# Patient Record
Sex: Female | Born: 1985 | Hispanic: Yes | Marital: Single | Smoking: Never smoker
Health system: Southern US, Community
[De-identification: ages and names within clinical notes are randomized; demographics above are authoritative.]

## PROBLEM LIST (undated history)

## (undated) DIAGNOSIS — O24419 Gestational diabetes mellitus in pregnancy, unspecified control: Secondary | ICD-10-CM

## (undated) DIAGNOSIS — M549 Dorsalgia, unspecified: Secondary | ICD-10-CM

## (undated) HISTORY — PX: APPENDECTOMY: SHX54

## (undated) HISTORY — DX: Dorsalgia, unspecified: M54.9

---
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
## 2012-03-15 ENCOUNTER — Emergency Department (HOSPITAL_COMMUNITY)
Admission: EM | Admit: 2012-03-15 | Discharge: 2012-03-15 | Disposition: A | Discharge status: Home / Self Care | Payer: Self-pay | Attending: Emergency Medicine | Admitting: Emergency Medicine

## 2012-03-15 ENCOUNTER — Encounter (HOSPITAL_COMMUNITY): Payer: Self-pay | Admitting: Emergency Medicine

## 2012-03-15 DIAGNOSIS — T50995A Adverse effect of other drugs, medicaments and biological substances, initial encounter: Secondary | ICD-10-CM | POA: Insufficient documentation

## 2012-03-15 DIAGNOSIS — Z79899 Other long term (current) drug therapy: Secondary | ICD-10-CM | POA: Insufficient documentation

## 2012-03-15 DIAGNOSIS — Z886 Allergy status to analgesic agent status: Secondary | ICD-10-CM

## 2012-03-15 DIAGNOSIS — Z888 Allergy status to other drugs, medicaments and biological substances status: Secondary | ICD-10-CM | POA: Insufficient documentation

## 2012-03-15 MED ORDER — HYDROCODONE-ACETAMINOPHEN 5-325 MG PO TABS: 1.0000 | ORAL_TABLET | ORAL | Status: DC | PRN

## 2012-03-15 MED ORDER — LORATADINE 10 MG PO TABS
10.0000 mg | ORAL_TABLET | Freq: Once | ORAL | Status: AC
  Administered 2012-03-15: 10 mg via ORAL
  Filled 2012-03-15 (×2): qty 1

## 2012-03-15 MED ORDER — DIPHENHYDRAMINE HCL 25 MG PO TABS: 25.0000 mg | ORAL_TABLET | Freq: Four times a day (QID) | ORAL | Status: DC

## 2012-03-15 MED ORDER — METHYLPREDNISOLONE SODIUM SUCC 125 MG IJ SOLR
125.0000 mg | Freq: Once | INTRAMUSCULAR | Status: AC
  Administered 2012-03-15: 125 mg via INTRAMUSCULAR
  Filled 2012-03-15: qty 2

## 2012-03-15 MED ORDER — PREDNISONE 20 MG PO TABS: 60.0000 mg | ORAL_TABLET | Freq: Every day | ORAL | Status: DC

## 2012-03-15 MED ORDER — LORATADINE 10 MG PO TABS: 10.0000 mg | ORAL_TABLET | Freq: Every day | ORAL | Status: DC

## 2012-03-15 NOTE — ED Provider Notes (Signed)
History     CSN: [id number]  Arrival date & time 03/15/12  0014   First MD Initiated Contact with Patient 03/15/12 0049      Chief Complaint  Patient presents with  . Allergic Reaction    (Consider location/radiation/quality/duration/timing/severity/associated sxs/prior treatment) HPI   Patient presented to the ER with concerns of allergic reaction to medication. She had been on Azithromycin for 4 days and then started taking her Fioricet this morning. She was given this medication for her headache. She said that she felt "a little weird" after taking it the first time and now she feels like her tongue and lips are swollen. No SOB, difficulty breathing or wheezing. Never had allergy to medication before. nad vss   History reviewed. No pertinent past medical history.  History reviewed. No pertinent past surgical history.  No family history on file.  History  Substance Use Topics  . Smoking status: Never Smoker   . Smokeless tobacco: Not on file  . Alcohol Use: No    OB History    Grav Para Term Preterm Abortions TAB SAB Ect Mult Living                  Review of Systems  Review of Systems  Gen: no weight loss, fevers, chills, night sweats  Eyes: no discharge or drainage, no occular pain or visual changes  Nose: no epistaxis or rhinorrhea  Mouth: no dental pain, no sore throat , subjective lip and tongue swelling Neck: no neck pain  Lungs:No wheezing, coughing or hemoptysis CV: no chest pain, palpitations, dependent edema or orthopnea  Abd: no abdominal pain, nausea, vomiting  GU: no dysuria or gross hematuria  MSK:  No abnormalities  Neuro: no headache, no focal neurologic deficits  Skin: no abnormalities Psyche: negative.   Allergies  Azithromycin and Fioricet  Home Medications   Current Outpatient Rx  Name  Route  Sig  Dispense  Refill  . AZITHROMYCIN 250 MG PO TABS   Oral   Take 250 mg by mouth daily.         Marland Kitchen BUTALBITAL-APAP-CAFFEINE  50-325-40 MG PO TABS   Oral   Take 1 tablet by mouth 2 (two) times daily as needed.         Marland Kitchen DIPHENHYDRAMINE HCL 25 MG PO TABS   Oral   Take 1 tablet (25 mg total) by mouth every 6 (six) hours.   20 tablet   0   . HYDROCODONE-ACETAMINOPHEN 5-325 MG PO TABS   Oral   Take 1 tablet by mouth every 4 (four) hours as needed for pain.   10 tablet   0   . LORATADINE 10 MG PO TABS   Oral   Take 1 tablet (10 mg total) by mouth daily.   14 tablet   0   . PREDNISONE 20 MG PO TABS   Oral   Take 3 tablets (60 mg total) by mouth daily.   15 tablet   0     BP 117/68  Pulse 85  Temp 99.3 F (37.4 C) (Oral)  Resp 14  SpO2 99%  Physical Exam  Nursing note and vitals reviewed. Constitutional: She appears well-developed and well-nourished. No distress.  HENT:  Head: Normocephalic and atraumatic. No trismus in the jaw.  Mouth/Throat: Mucous membranes are normal. No uvula swelling. No posterior oropharyngeal edema.       I am unable to appreciate swelling of the tongue or lips.   Eyes: Pupils are equal, round,  and reactive to light.  Neck: Normal range of motion. Neck supple.  Cardiovascular: Normal rate and regular rhythm.   Pulmonary/Chest: Effort normal. No respiratory distress. She has no wheezes. She has no rales. She exhibits no tenderness.  Abdominal: Soft.  Neurological: She is alert.  Skin: Skin is warm and dry.    ED Course  Procedures (including critical care time)  Labs Reviewed - No data to display No results found.   1. Allergy to pain medication       MDM  Pt appears well. I am unable to appreciate any of the symptoms she described.  She has been instructed to discontinue Fioricet.  125 mg IM solumedrol and Claritin given in ED. Rx for prednisone, benadryl, Claritin.  Strict return to ED precautions given.  Pt has been advised of the symptoms that warrant their return to the ED. Patient has voiced understanding and has agreed to follow-up with the  PCP or specialist.         Dorthula Matas, PA 03/18/12 [id number]

## 2012-03-15 NOTE — ED Notes (Signed)
Pt states understanding of discharge instruction 

## 2012-03-15 NOTE — ED Notes (Signed)
No swelling noted.

## 2012-03-15 NOTE — ED Notes (Addendum)
PT. REPORTS LEFT LOWER LIP DISCOMFORT , CONCERNED THAT HER AZITHROMAX AND FIORICET PRESCRIBED FOR HER LEFT EAR INFECTION IS CAUSING HER LOWER LIP TO TWITCH. [facility]

## 2012-03-18 NOTE — ED Provider Notes (Signed)
  Medical screening examination/treatment/procedure(s) were performed by non-physician practitioner and as supervising physician I was immediately available for consultation/collaboration.r  Olivia Mackie, MD 03/18/12 2001

## 2012-03-23 ENCOUNTER — Emergency Department (HOSPITAL_COMMUNITY): Payer: Self-pay

## 2012-03-23 ENCOUNTER — Encounter (HOSPITAL_COMMUNITY): Payer: Self-pay | Admitting: Emergency Medicine

## 2012-03-23 ENCOUNTER — Emergency Department (HOSPITAL_COMMUNITY)
Admission: EM | Admit: 2012-03-23 | Discharge: 2012-03-23 | Disposition: A | Discharge status: Home Health | Payer: Self-pay | Attending: Emergency Medicine | Admitting: Emergency Medicine

## 2012-03-23 DIAGNOSIS — M542 Cervicalgia: Secondary | ICD-10-CM | POA: Insufficient documentation

## 2012-03-23 DIAGNOSIS — R202 Paresthesia of skin: Secondary | ICD-10-CM

## 2012-03-23 DIAGNOSIS — M62838 Other muscle spasm: Secondary | ICD-10-CM | POA: Insufficient documentation

## 2012-03-23 DIAGNOSIS — R209 Unspecified disturbances of skin sensation: Secondary | ICD-10-CM | POA: Insufficient documentation

## 2012-03-23 DIAGNOSIS — M25519 Pain in unspecified shoulder: Secondary | ICD-10-CM | POA: Insufficient documentation

## 2012-03-23 DIAGNOSIS — Z3202 Encounter for pregnancy test, result negative: Secondary | ICD-10-CM | POA: Insufficient documentation

## 2012-03-23 LAB — URINALYSIS, ROUTINE W REFLEX MICROSCOPIC
Glucose, UA: NEGATIVE mg/dL
Ketones, ur: NEGATIVE mg/dL
Protein, ur: NEGATIVE mg/dL

## 2012-03-23 LAB — URINE MICROSCOPIC-ADD ON

## 2012-03-23 MED ORDER — DIAZEPAM 5 MG PO TABS
5.0000 mg | ORAL_TABLET | Freq: Once | ORAL | Status: AC
  Administered 2012-03-23: 5 mg via ORAL
  Filled 2012-03-23: qty 1

## 2012-03-23 MED ORDER — CYCLOBENZAPRINE HCL 10 MG PO TABS: 10.0000 mg | ORAL_TABLET | Freq: Two times a day (BID) | ORAL | Status: DC | PRN

## 2012-03-23 MED ORDER — IBUPROFEN 800 MG PO TABS: 800.0000 mg | ORAL_TABLET | Freq: Three times a day (TID) | ORAL | Status: DC

## 2012-03-23 MED ORDER — HYDROCODONE-ACETAMINOPHEN 5-325 MG PO TABS: 2.0000 | ORAL_TABLET | ORAL | Status: DC | PRN

## 2012-03-23 MED ORDER — HYDROCODONE-ACETAMINOPHEN 5-325 MG PO TABS
2.0000 | ORAL_TABLET | Freq: Once | ORAL | Status: AC
  Administered 2012-03-23: 2 via ORAL
  Filled 2012-03-23: qty 2

## 2012-03-23 MED ORDER — IBUPROFEN 800 MG PO TABS
800.0000 mg | ORAL_TABLET | Freq: Once | ORAL | Status: AC
  Administered 2012-03-23: 800 mg via ORAL
  Filled 2012-03-23: qty 1

## 2012-03-23 NOTE — ED Notes (Signed)
Translator stated, I have pain on my face and and feels like its a burn.  Here on 03/15/12 and treated for the same.

## 2012-03-23 NOTE — ED Notes (Signed)
Pt discharged to home with family. NAD.  

## 2012-03-23 NOTE — ED Provider Notes (Signed)
History     CSN: [id number]  Arrival date & time 03/23/12  1244   First MD Initiated Contact with Patient 03/23/12 1314      Chief Complaint  Patient presents with  . Facial Pain    (Consider location/radiation/quality/duration/timing/severity/associated sxs/prior treatment) HPI Comments: Patient presents with right-sided facial, neck, shoulder pain for the past one day. Denies any injury. Pain is deep and burning. Denies similar episodes. Seen last week for questionable allergic reaction with facial complaints but she says this is different. Denies any fever, chills, nausea or vomiting. Denies any weakness, numbness or tingling. Denies any rashes. Denies any difficulty breathing or swallowing.  The history is provided by the patient and a relative. The history is limited by a language barrier. A language interpreter was used.    History reviewed. No pertinent past medical history.  History reviewed. No pertinent past surgical history.  No family history on file.  History  Substance Use Topics  . Smoking status: Never Smoker   . Smokeless tobacco: Not on file  . Alcohol Use: No    OB History    Grav Para Term Preterm Abortions TAB SAB Ect Mult Living                  Review of Systems  Constitutional: Negative for activity change and appetite change.  HENT: Negative for congestion and rhinorrhea.   Respiratory: Negative for cough, chest tightness and shortness of breath.   Gastrointestinal: Negative for nausea, vomiting and abdominal pain.  Genitourinary: Negative for dysuria and hematuria.  Musculoskeletal: Positive for myalgias and arthralgias.  Skin: Negative for rash.  Neurological: Negative for dizziness, weakness and headaches.  A complete 10 system review of systems was obtained and all systems are negative except as noted in the HPI and PMH.    Allergies  Azithromycin and Fioricet  Home Medications   Current Outpatient Rx  Name  Route  Sig  Dispense   Refill  . BUTALBITAL-APAP-CAFFEINE 50-325-40 MG PO TABS   Oral   Take 1 tablet by mouth 2 (two) times daily as needed. For headache         . DIPHENHYDRAMINE HCL 25 MG PO TABS   Oral   Take 1 tablet (25 mg total) by mouth every 6 (six) hours.   20 tablet   0   . LORATADINE 10 MG PO TABS   Oral   Take 1 tablet (10 mg total) by mouth daily.   14 tablet   0   . CYCLOBENZAPRINE HCL 10 MG PO TABS   Oral   Take 1 tablet (10 mg total) by mouth 2 (two) times daily as needed for muscle spasms.   20 tablet   0   . HYDROCODONE-ACETAMINOPHEN 5-325 MG PO TABS   Oral   Take 2 tablets by mouth every 4 (four) hours as needed for pain.   6 tablet   0   . IBUPROFEN 800 MG PO TABS   Oral   Take 1 tablet (800 mg total) by mouth 3 (three) times daily.   21 tablet   0     BP 138/76  Pulse 105  Temp 98.2 F (36.8 C) (Oral)  Resp 16  SpO2 97%  Physical Exam  Constitutional: She is oriented to person, place, and time. She appears well-developed and well-nourished. No distress.  HENT:  Head: Normocephalic and atraumatic.  Mouth/Throat: Oropharynx is clear and moist. No oropharyngeal exudate.       Smile symmetric,  no rash. No jaw swelling.  Eyes: Conjunctivae normal and EOM are normal. Pupils are equal, round, and reactive to light.  Neck: Normal range of motion. Neck supple.       No meningismus  Cardiovascular: Normal rate, regular rhythm and normal heart sounds.   No murmur heard. Pulmonary/Chest: Effort normal and breath sounds normal. No respiratory distress.  Abdominal: Soft. There is no tenderness. There is no rebound and no guarding.  Musculoskeletal: Normal range of motion. She exhibits tenderness. She exhibits no edema.       Tenderness and spasm to right upper trapezius Equal grip strength bilaterally  Neurological: She is alert and oriented to person, place, and time. No cranial nerve deficit. She exhibits normal muscle tone. Coordination normal.  Skin: Skin is warm.     ED Course  Procedures (including critical care time)   Labs Reviewed  PREGNANCY, URINE  URINALYSIS, ROUTINE W REFLEX MICROSCOPIC   Ct Head Wo Contrast  03/23/2012  *RADIOLOGY REPORT*  Clinical Data: Headache and facial pain, recently treated for left ear infection.  CT HEAD WITHOUT CONTRAST  Technique:  Contiguous axial images were obtained from the base of the skull through the vertex without contrast.  Comparison: None.  Findings:  Wallace Cullens white differentiation is maintained.  No CT evidence of acute large territory infarct.  No intraparenchymal or extra-axial mass or hemorrhage.  Normal size and configuration of the ventricles and basilar cisterns.  No midline shift.  Limited visualization of the paranasal sinuses and mastoid air cells are normal.  No air fluid levels.  Regional soft tissues are normal.  No displaced calvarial fracture.  IMPRESSION: Negative noncontrast head CT.   Original Report Authenticated By: Tacey Ruiz, MD    Mr Brain Wo Contrast  03/23/2012  *RADIOLOGY REPORT*  Clinical Data: Headache and facial pain.  MRI HEAD WITHOUT CONTRAST  Technique:  Multiplanar, multiecho pulse sequences of the brain and surrounding structures were obtained according to standard protocol without intravenous contrast.  Comparison: None.  Findings: A single white matter lesion in the right corona radiata measures 3.5 mm.  No other significant white matter disease is present.  No acute infarct, hemorrhage, or mass lesion is evident. Flow is present in the major intracranial arteries.  The globes orbits are intact.  The paranasal sinuses and mastoid air cells are clear.  IMPRESSION:  1.  Single 3 mm white matter lesion in the right corona radiata is nonspecific in a 27 year old female and may be within normal limits. 2.  Otherwise normal MRI of the brain.   Original Report Authenticated By: Marin Roberts, M.D.      1. Muscle spasm   2. Paresthesia       MDM  Right upper back and neck  pain it seems to be radiating into face. Exam consistent with muscle spasm. No rashes or neurological deficits.  Patient has equal grip strength bilaterally. Her smile symmetric. There is no facial asymmetry. She has significant tenderness palpation of her right upper trapezius and shoulder.  Given her paresthesias and "mouth twisting out of shape" last week, neuroimaging obtained.  No evidence of CVA. Nonspecific single white matter lesion discussed with Dr. Leroy Kennedy of neurology who agrees may be normal variant.  Treat muscle spasm which is likely source of patient's facial pain and paresthesias. Followup with neurology.    Glynn Octave, MD 03/23/12 [id number]

## 2014-08-04 ENCOUNTER — Encounter: Payer: Self-pay | Admitting: Obstetrics & Gynecology

## 2014-08-05 ENCOUNTER — Encounter: Payer: Self-pay | Admitting: *Deleted

## 2014-09-03 ENCOUNTER — Ambulatory Visit (INDEPENDENT_AMBULATORY_CARE_PROVIDER_SITE_OTHER): Payer: Self-pay | Admitting: Obstetrics & Gynecology

## 2014-09-03 ENCOUNTER — Encounter: Payer: Self-pay | Admitting: Obstetrics & Gynecology

## 2014-09-03 VITALS — BP 120/63 | HR 77 | Temp 98.1°F | Ht 60.0 in | Wt 174.9 lb

## 2014-09-03 DIAGNOSIS — N912 Amenorrhea, unspecified: Secondary | ICD-10-CM

## 2014-09-03 MED ORDER — MEDROXYPROGESTERONE ACETATE 10 MG PO TABS: 20.0000 mg | ORAL_TABLET | Freq: Every day | ORAL | Status: DC

## 2014-09-03 NOTE — Progress Notes (Signed)
[employer] used for [employer]

## 2014-09-03 NOTE — Progress Notes (Signed)
Patient ID: Julie Brooks, female   DOB: 09/09/1985, 29 y.o.   MRN: [id number]  Chief Complaint  Patient presents with  . Amenorrhea    HPI Julie Brooks is a 29 y.o. female.  [id number] No LMP recorded. Amenorrhea long standing after using depo provera then Nexplanon which was removed 1/16 at health dept. No withdrawal after provera challenge. Wants to conceive  HPI  History reviewed. No pertinent past medical history.  Past Surgical History  Procedure Laterality Date  . Appendectomy      Family History  Problem Relation Age of Onset  . Diabetes Father   . Diabetes Mother     Social History History  Substance Use Topics  . Smoking status: Never Smoker   . Smokeless tobacco: Never Used  . Alcohol Use: No    Allergies  Allergen Reactions  . Azithromycin Swelling    Possible reaction to current medication  . Fioricet [Butalbital-Apap-Caffeine] Swelling    Possible reaction to current medication    Current Outpatient Prescriptions  Medication Sig Dispense Refill  . Prenatal Vit-Fe Fumarate-FA (PRENATAL MULTIVITAMIN) TABS tablet Take 1 tablet by mouth daily at 12 noon.    . medroxyPROGESTERone (PROVERA) 10 MG tablet Take 2 tablets (20 mg total) by mouth daily. Use for ten days 20 tablet 2   No current facility-administered medications for this visit.    Review of Systems Review of Systems  Constitutional: Negative.   Gastrointestinal: Negative.   Endocrine: Negative.   Genitourinary: Negative.     Blood pressure 120/63, pulse 77, temperature 98.1 F (36.7 C), temperature source Oral, height 5' (1.524 m), weight 174 lb 14.4 oz (79.334 kg).  Physical Exam Physical Exam  Constitutional: She is oriented to person, place, and time. She appears well-developed. No distress.  Pulmonary/Chest: Effort normal. No respiratory distress.  Neurological: She is alert and oriented to person, place, and time.  Skin: Skin is warm and dry.  Psychiatric: She has a  normal mood and affect. Her behavior is normal.    Data Reviewed Referral notes  Assessment    Amenorrhea probable anovulation after progestin BCM stopped 6 mo ago R/O ovarian failure     Plan    Ms. Mas Brooks had no medications administered during this visit.  Provera 20 mg 10 day challenge [facility] RTC 2 mo       Sherra Kimmons 09/03/2014, 1:20 PM

## 2014-09-04 LAB — FOLLICLE STIMULATING HORMONE: FSH: 7.6 m[IU]/mL

## 2014-12-03 ENCOUNTER — Ambulatory Visit (INDEPENDENT_AMBULATORY_CARE_PROVIDER_SITE_OTHER): Payer: Self-pay | Admitting: Obstetrics & Gynecology

## 2014-12-03 ENCOUNTER — Encounter: Payer: Self-pay | Admitting: Obstetrics & Gynecology

## 2014-12-03 VITALS — BP 119/64 | HR 83 | Temp 98.8°F | Ht 60.0 in | Wt 173.8 lb

## 2014-12-03 DIAGNOSIS — N912 Amenorrhea, unspecified: Secondary | ICD-10-CM

## 2014-12-03 NOTE — Patient Instructions (Signed)
Amenorrea secundaria  (Secondary Amenorrhea) La amenorrea secundaria es la detencin del flujo menstrual durante 3-6 meses en una mujer que previamente tena sus perodos. Hay muchas causas posibles: La mayora de las causas no son graves. Generalmente, al tratar el problema subyacente que causa la detencin de la menstruacin, podr volver a tener sus perodos normales. CAUSAS  Algunas causas comunes de la falta de menstruacin son:  Desnutricin.  Bajo nivel de azcar en la sangre (hipoglucemia).  Enfermedad poliqustica de los ovarios.  Estrs o miedos.  Lactancia materna.  Desequilibrio hormonal.  Insuficiencia ovrica.  Medicamentos.  Obesidad extrema.  Fibrosis qustica.  Reduccin de peso drstica por cualquier causa.  Menopausia precoz.  Extirpacin de los ovarios o del tero.  Anticonceptivos.  Enfermedades.  Enfermedades de larga duracin (crnicas).  Sndrome de [place].  Problemas de tiroides.  Pldoras, parches o anillos vaginales para el control de la natalidad. FACTORES DE RIESGO Puede tener ms riesgo de amenorrea secundaria si:  Tiene una historia familiar de este problema.  Sufre un trastorno alimentario.  Realiza entrenamiento deportivo. DIAGNSTICO  Este diagnstico la realiza el mdico por medio de la historia clnica y el examen fsico. Incluir un examen plvico para verificar si hay problemas en los rganos reproductores. Debe descartarse la posibilidad de embarazo. Generalmente se indicarn diferentes anlisis de sangre para medir diferentes tipos de hormonas en el organismo. Le indicarn anlisis de orina. Le harn algunos estudios especializados (ecografas, tomografa computada, resonancia magntica o histeroscopa) y tambin medirn su ndice de masa corporal (IMC). TRATAMIENTO  El tratamiento depende de la causa de la amenorrea. Si hay un trastorno de la alimentacin, deber tratarse con la dieta y la terapia adecuadas. Los  trastornos crnicos pueden mejorar con el tratamiento de la enfermedad. La amenorrea puede corregirse con medicamentos, cambios en el estilo de vida o con ciruga. Si la amenorrea no puede corregirse, algunas veces es posible crear una falsa menstruacin con medicamentos. INSTRUCCIONES PARA EL CUIDADO EN EL HOGAR  Consuma una dieta saludable.  Controle los problemas de peso.  Haga ejercicios con regularidad, pero no excesivamente.  Duerma lo suficiente.  Controle el estrs.  Observe si hay cambios en el ciclo menstrual. Mantenga un registro del momento en que ocurren los perodos. Anote la fecha de inicio de los perodos, cunto duran y si hay problemas. SOLICITE ATENCIN MDICA SI: Los sntomas no mejoran con el tratamiento.   Esta informacin no tiene como fin reemplazar el consejo del mdico. Asegrese de hacerle al mdico cualquier pregunta que tenga.   Document Released: 10/09/2012 Document Revised: 02/27/2014 Elsevier Interactive Patient Education 2016 [employer]  

## 2014-12-03 NOTE — Progress Notes (Signed)
Subjective:     Patient ID: Darnelle CatalanGlendy Mas Esquivel, female   DOB: 06/24/1985, 29 y.o.   MRN: [id number]  HPI [id number] Patient's last menstrual period was 11/27/2014. Period was for 2 days and light. No W/D on Provera. Still would like to conceive   Review of Systems  Constitutional: Negative.   Cardiovascular: Negative.   Genitourinary: Positive for menstrual problem (irregular). Negative for pelvic pain.       Objective:   Physical Exam  Constitutional: She is oriented to person, place, and time. She appears well-developed. No distress.  Cardiovascular: Normal rate.   Pulmonary/Chest: Effort normal. No respiratory distress.  Neurological: She is alert and oriented to person, place, and time.  Skin: Skin is warm and dry.  Psychiatric: She has a normal mood and affect. Her behavior is normal.       Assessment:     Likely prolonged amenorrhea after progestin use. Wants to conceive    Normal FSH  Plan:     Provera 10 mg 10 days every 6 weeks if no menses, report if she conceives     Adam PhenixJames G Dynasty Holquin, MD 12/03/2014

## 2014-12-03 NOTE — Progress Notes (Signed)
Spanish interpreter Clent RidgesAllis Brooks; pt reports taking provera challenge in July and just had a period on 11/27/14.  Pt wants to become pregnant.  Gave pt # to free pap screening.

## 2016-02-01 LAB — CYTOLOGY - PAP: PAP SMEAR: NEGATIVE

## 2017-01-15 LAB — OB RESULTS CONSOLE HEPATITIS B SURFACE ANTIGEN: Hepatitis B Surface Ag: NEGATIVE

## 2017-01-15 LAB — OB RESULTS CONSOLE RUBELLA ANTIBODY, IGM: Rubella: IMMUNE

## 2017-01-15 LAB — SICKLE CELL SCREEN: SICKLE CELL SCREEN: NORMAL

## 2017-01-15 LAB — OB RESULTS CONSOLE HIV ANTIBODY (ROUTINE TESTING): HIV: NONREACTIVE

## 2017-01-18 LAB — OB RESULTS CONSOLE VARICELLA ZOSTER ANTIBODY, IGG: VARICELLA IGG: IMMUNE

## 2017-01-18 LAB — OB RESULTS CONSOLE ABO/RH: RH TYPE: POSITIVE

## 2017-01-18 LAB — OB RESULTS CONSOLE GC/CHLAMYDIA
CHLAMYDIA, DNA PROBE: NEGATIVE
GC PROBE AMP, GENITAL: NEGATIVE

## 2017-01-18 LAB — OB RESULTS CONSOLE HGB/HCT, BLOOD
HCT: 37
Hemoglobin: 12.2

## 2017-01-18 LAB — OB RESULTS CONSOLE HIV ANTIBODY (ROUTINE TESTING): HIV: NONREACTIVE

## 2017-01-18 LAB — OB RESULTS CONSOLE RPR: RPR: NONREACTIVE

## 2017-01-18 LAB — OB RESULTS CONSOLE ANTIBODY SCREEN: Antibody Screen: NEGATIVE

## 2017-01-18 LAB — OB RESULTS CONSOLE PLATELET COUNT: Platelets: 246

## 2017-01-18 LAB — GLUCOSE TOLERANCE, 1 HOUR: Glucose 1 Hour: 150

## 2017-01-18 LAB — CULTURE, OB URINE: Urine Culture, OB: NEGATIVE

## 2017-01-18 LAB — CYSTIC FIBROSIS DIAGNOSTIC STUDY: INTERPRETATION-CFDNA: NEGATIVE

## 2017-01-22 ENCOUNTER — Other Ambulatory Visit (HOSPITAL_COMMUNITY): Payer: Self-pay | Admitting: Nurse Practitioner

## 2017-01-22 DIAGNOSIS — Z3A20 20 weeks gestation of pregnancy: Secondary | ICD-10-CM

## 2017-01-22 DIAGNOSIS — O09219 Supervision of pregnancy with history of pre-term labor, unspecified trimester: Secondary | ICD-10-CM

## 2017-01-22 LAB — GLUCOSE, 3 HOUR

## 2017-01-25 ENCOUNTER — Ambulatory Visit: Payer: Self-pay

## 2017-01-30 ENCOUNTER — Encounter: Payer: Self-pay | Admitting: *Deleted

## 2017-01-30 ENCOUNTER — Ambulatory Visit: Payer: Self-pay

## 2017-02-01 ENCOUNTER — Ambulatory Visit: Payer: Self-pay | Admitting: *Deleted

## 2017-02-01 ENCOUNTER — Encounter: Payer: Self-pay | Attending: Family Medicine | Admitting: *Deleted

## 2017-02-01 DIAGNOSIS — R7309 Other abnormal glucose: Secondary | ICD-10-CM

## 2017-02-01 DIAGNOSIS — R7302 Impaired glucose tolerance (oral): Secondary | ICD-10-CM | POA: Insufficient documentation

## 2017-02-01 NOTE — Progress Notes (Signed)
  Patient was seen on 02/01/2017 for Gestational Diabetes self-management . Patient here with Spanish Interpretor. No history of GDM with last pregnancy 10 years ago. Patient met with Health Department educator and received some training on food during GDM, but is apparently over restricting her food intake to 15 grams carb per meal. Diet history reveals good variety of all food groups and no sweetened beverages now. The following learning objectives were met by the patient :   States the definition of Gestational Diabetes  States why dietary management is important in controlling blood glucose  Describes the effects of carbohydrates on blood glucose levels  Demonstrates ability to create a balanced meal plan  Demonstrates carbohydrate counting   States when to check blood glucose levels  Demonstrates proper blood glucose monitoring techniques  States the effect of stress and exercise on blood glucose levels  States the importance of limiting caffeine and abstaining from alcohol and smoking  Plan:  Aim for 3 Carb Choices per meal (45 grams) +/- 1 either way  Aim for 1-2 Carbs per snack Begin reading food labels for Total Carbohydrate of foods Consider  increasing your activity level by walking or other activity daily as tolerated Begin checking BG before breakfast and 2 hours after first bite of breakfast, lunch and dinner as directed by MD  Bring Log Book to every medical appointment   Take medication if directed by MD  Blood glucose monitor given: [employer] Lot # [id number] Exp: 02/10/2019 Blood glucose reading: 77 mg/dl fasting today  Patient instructed to monitor glucose levels: FBS: 60 - 95 mg/dl 2 hour: <120 mg/dl  Patient received the following handouts:  Nutrition Diabetes and Pregnancy  Carbohydrate Counting List  Patient will be seen for follow-up as needed.

## 2017-02-02 ENCOUNTER — Encounter: Payer: Self-pay | Admitting: Family Medicine

## 2017-02-02 ENCOUNTER — Other Ambulatory Visit: Payer: Self-pay

## 2017-02-02 ENCOUNTER — Ambulatory Visit (INDEPENDENT_AMBULATORY_CARE_PROVIDER_SITE_OTHER): Payer: Medicaid Other | Admitting: Family Medicine

## 2017-02-02 DIAGNOSIS — O0990 Supervision of high risk pregnancy, unspecified, unspecified trimester: Secondary | ICD-10-CM | POA: Insufficient documentation

## 2017-02-02 DIAGNOSIS — Z8632 Personal history of gestational diabetes: Secondary | ICD-10-CM | POA: Insufficient documentation

## 2017-02-02 DIAGNOSIS — O24410 Gestational diabetes mellitus in pregnancy, diet controlled: Secondary | ICD-10-CM | POA: Diagnosis not present

## 2017-02-02 DIAGNOSIS — O09211 Supervision of pregnancy with history of pre-term labor, first trimester: Secondary | ICD-10-CM | POA: Diagnosis not present

## 2017-02-02 DIAGNOSIS — O09219 Supervision of pregnancy with history of pre-term labor, unspecified trimester: Secondary | ICD-10-CM

## 2017-02-02 DIAGNOSIS — O0991 Supervision of high risk pregnancy, unspecified, first trimester: Secondary | ICD-10-CM

## 2017-02-02 DIAGNOSIS — O09899 Supervision of other high risk pregnancies, unspecified trimester: Secondary | ICD-10-CM

## 2017-02-02 DIAGNOSIS — Z8751 Personal history of pre-term labor: Secondary | ICD-10-CM | POA: Insufficient documentation

## 2017-02-02 MED ORDER — ASPIRIN EC 81 MG PO TBEC: 81.0000 mg | DELAYED_RELEASE_TABLET | Freq: Every day | ORAL | 3 refills | Status: DC

## 2017-02-02 MED ORDER — HYDROXYPROGESTERONE CAPROATE 250 MG/ML IM OIL: 250.0000 mg | TOPICAL_OIL | INTRAMUSCULAR | 5 refills | Status: DC

## 2017-02-02 NOTE — Progress Notes (Signed)
Spanish Interpreter Natale Layrika McReynolds New ob packet given

## 2017-02-02 NOTE — Patient Instructions (Signed)
Segundo trimestre de embarazo (Second Trimester of Pregnancy) El segundo trimestre va desde la semana13 hasta la 28, desde el cuarto hasta el sexto mes, y suele ser el momento en el que mejor se siente. Su organismo se ha adaptado a estar embarazada y comienza a sentirse fsicamente mejor. En general, las nuseas matutinas han disminuido o han desaparecido completamente, puede tener ms energa y un aumento de apetito. El segundo trimestre es tambin la poca en la que el feto se desarrolla rpidamente. Hacia el final del sexto mes, el feto mide aproximadamente 9pulgadas (23cm) y pesa alrededor de 1 libras (700g). Es probable que sienta que el beb se mueve (da pataditas) entre las 18 y 20semanas del embarazo. CAMBIOS EN EL ORGANISMO Su organismo atraviesa por muchos cambios durante el embarazo, y estos varan de una mujer a otra.  Seguir aumentando de peso. Notar que la parte baja del abdomen sobresale.  Podrn aparecer las primeras estras en las caderas, el abdomen y las mamas.  Es posible que tenga dolores de cabeza que pueden aliviarse con los medicamentos que el mdico le permita tomar.  Tal vez tenga necesidad de orinar con ms frecuencia porque el feto est ejerciendo presin sobre la vejiga.  Debido al embarazo podr sentir acidez estomacal con frecuencia.  Puede estar estreida, ya que ciertas hormonas enlentecen los movimientos de los msculos que empujan los desechos a travs de los intestinos.  Pueden aparecer hemorroides o abultarse e hincharse las venas (venas varicosas).  Puede tener dolor de espalda que se debe al aumento de peso y a que las hormonas del embarazo relajan las articulaciones entre los huesos de la pelvis, y como consecuencia de la modificacin del peso y los msculos que mantienen el equilibrio.  Las mamas seguirn creciendo y le dolern.  Las encas pueden sangrar y estar sensibles al cepillado y al hilo dental.  Pueden aparecer zonas oscuras o  manchas (cloasma, mscara del embarazo) en el rostro que probablemente se atenuar despus del nacimiento del beb.  Es posible que se forme una lnea oscura desde el ombligo hasta la zona del pubis (linea nigra) que probablemente se atenuar despus del nacimiento del beb.  Tal vez haya cambios en el cabello que pueden incluir su engrosamiento, crecimiento rpido y cambios en la textura. Adems, a algunas mujeres se les cae el cabello durante o despus del embarazo, o tienen el cabello seco o fino. Lo ms probable es que el cabello se le normalice despus del nacimiento del beb. QU DEBE ESPERAR EN LAS CONSULTAS PRENATALES Durante una visita prenatal de rutina:  La pesarn para asegurarse de que usted y el feto estn creciendo normalmente.  Le tomarn la presin arterial.  Le medirn el abdomen para controlar el desarrollo del beb.  Se escucharn los latidos cardacos fetales.  Se evaluarn los resultados de los estudios solicitados en visitas anteriores. El mdico puede preguntarle lo siguiente:  Cmo se siente.  Si siente los movimientos del beb.  Si ha tenido sntomas anormales, como prdida de lquido, sangrado, dolores de cabeza intensos o clicos abdominales.  Si est consumiendo algn producto que contenga tabaco, como cigarrillos, tabaco de mascar y cigarrillos electrnicos.  Si tiene alguna pregunta. Otros estudios que podrn realizarse durante el segundo trimestre incluyen lo siguiente:  Anlisis de sangre para detectar lo siguiente:  Concentraciones de hierro bajas (anemia).  Diabetes gestacional (entre la semana 24 y la 28).  Anticuerpos Rh.  Anlisis de orina para detectar infecciones, diabetes o protenas en la orina.    Una ecografa para confirmar que el beb crece y se desarrolla correctamente.  Una amniocentesis para diagnosticar posibles problemas genticos.  Estudios del feto para descartar espina bfida y sndrome de Down.  Prueba del VIH (virus  de inmunodeficiencia humana). Los exmenes prenatales de rutina incluyen la prueba de deteccin del VIH, a menos que decida no realizrsela. INSTRUCCIONES PARA EL CUIDADO EN EL HOGAR  Evite fumar, consumir hierbas, beber alcohol y tomar frmacos que no le hayan recetado. Estas sustancias qumicas afectan la formacin y el desarrollo del beb.  No consuma ningn producto que contenga tabaco, lo que incluye cigarrillos, tabaco de mascar y cigarrillos electrnicos. Si necesita ayuda para dejar de fumar, consulte al mdico. Puede recibir asesoramiento y otro tipo de recursos para dejar de fumar.  Siga las indicaciones del mdico en relacin con el uso de medicamentos. Durante el embarazo, hay medicamentos que son seguros de tomar y otros que no.  Haga ejercicio solamente como se lo haya indicado el mdico. Sentir clicos uterinos es un buen signo para detener la actividad fsica.  Contine comiendo alimentos sanos con regularidad.  Use un sostn que le brinde buen soporte si le duelen las mamas.  No se d baos de inmersin en agua caliente, baos turcos ni saunas.  Use el cinturn de seguridad en todo momento mientras conduce.  No coma carne cruda ni queso sin cocinar; evite el contacto con las bandejas sanitarias de los gatos y la tierra que estos animales usan. Estos elementos contienen grmenes que pueden causar defectos congnitos en el beb.  Tome las vitaminas prenatales.  Tome entre 1500 y 2000mg de calcio diariamente comenzando en la semana20 del embarazo hasta el parto.  Si est estreida, pruebe un laxante suave (si el mdico lo autoriza). Consuma ms alimentos ricos en fibra, como vegetales y frutas frescos y cereales integrales. Beba gran cantidad de lquido para mantener la orina de tono claro o color amarillo plido.  Dese baos de asiento con agua tibia para aliviar el dolor o las molestias causadas por las hemorroides. Use una crema para las hemorroides si el mdico la  autoriza.  Si tiene venas varicosas, use medias de descanso. Eleve los pies durante 15minutos, 3 o 4veces por da. Limite el consumo de sal en su dieta.  No levante objetos pesados, use zapatos de tacones bajos y mantenga una buena postura.  Descanse con las piernas elevadas si tiene calambres o dolor de cintura.  Visite a su dentista si an no lo ha hecho durante el embarazo. Use un cepillo de dientes blando para higienizarse los dientes y psese el hilo dental con suavidad.  Puede seguir manteniendo relaciones sexuales, a menos que el mdico le indique lo contrario.  Concurra a todas las visitas prenatales segn las indicaciones de su mdico. SOLICITE ATENCIN MDICA SI:  Tiene mareos.  Siente clicos leves, presin en la pelvis o dolor persistente en el abdomen.  Tiene nuseas, vmitos o diarrea persistentes.  Observa una secrecin vaginal con mal olor.  Siente dolor al orinar. SOLICITE ATENCIN MDICA DE INMEDIATO SI:  Tiene fiebre.  Tiene una prdida de lquido por la vagina.  Tiene sangrado o pequeas prdidas vaginales.  Siente dolor intenso o clicos en el abdomen.  Sube o baja de peso rpidamente.  Tiene dificultad para respirar y siente dolor de pecho.  Sbitamente se le hinchan mucho el rostro, las manos, los tobillos, los pies o las piernas.  No ha sentido los movimientos del beb durante una hora.  Siente un   dolor de cabeza intenso que no se alivia con medicamentos.  Su visin se modifica. Esta informacin no tiene como fin reemplazar el consejo del mdico. Asegrese de hacerle al mdico cualquier pregunta que tenga. Document Released: 11/16/2004 Document Revised: 02/27/2014 Document Reviewed: 04/09/2012 Elsevier Interactive Patient Education  2017 [employer]   Lactancia materna (Breastfeeding) Decidir amamantar es una de las mejores elecciones que puede hacer por usted y su beb. El cambio hormonal durante el embarazo produce el desarrollo del  tejido mamario y aumenta la cantidad y el tamao de los conductos galactforos. Estas hormonas tambin permiten que las protenas, los azcares y las grasas de la sangre produzcan la leche materna en las glndulas productoras de leche. Las hormonas impiden que la leche materna sea liberada antes del nacimiento del beb, adems de impulsar el flujo de leche luego del nacimiento. Una vez que ha comenzado a amamantar, pensar en el beb, as como la succin o el llanto, pueden estimular la liberacin de leche de las glndulas productoras de leche. LOS BENEFICIOS DE AMAMANTAR Para el beb   La primera leche (calostro) ayuda a mejorar el funcionamiento del sistema digestivo del beb.  La leche tiene anticuerpos que ayudan a prevenir las infecciones en el beb.  El beb tiene una menor incidencia de asma, alergias y del sndrome de muerte sbita del lactante.  Los nutrientes en la leche materna son mejores para el beb que la leche maternizada y estn preparados exclusivamente para cubrir las necesidades del beb.  La leche materna mejora el desarrollo cerebral del beb.  Es menos probable que el beb desarrolle otras enfermedades, como obesidad infantil, asma o diabetes mellitus de tipo 2. Para usted   La lactancia materna favorece el desarrollo de un vnculo muy especial entre la madre y el beb.  Es conveniente. La leche materna siempre est disponible a la temperatura correcta y es econmica.  La lactancia materna ayuda a quemar caloras y a perder el peso ganado durante el embarazo.  Favorece la contraccin del tero al tamao que tena antes del embarazo de manera ms rpida y disminuye el sangrado (loquios) despus del parto.  La lactancia materna contribuye a reducir el riesgo de desarrollar diabetes mellitus de tipo 2, osteoporosis o cncer de mama o de ovario en el futuro. SIGNOS DE QUE EL BEB EST HAMBRIENTO Primeros signos de hambre   Aumenta su estado de alerta o actividad.  Se  estira.  Mueve la cabeza de un lado a otro.  Mueve la cabeza y abre la boca cuando se le toca la mejilla o la comisura de la boca (reflejo de bsqueda).  Aumenta las vocalizaciones, tales como sonidos de succin, se relame los labios, emite arrullos, suspiros, o chirridos.  Mueve la mano hacia la boca.  Se chupa con ganas los dedos o las manos. Signos tardos de hambre   Est agitado.  Llora de manera intermitente. Signos de hambre extrema  Los signos de hambre extrema requerirn que lo calme y lo consuele antes de que el beb pueda alimentarse adecuadamente. No espere a que se manifiesten los siguientes signos de hambre extrema para comenzar a amamantar:  Agitacin.  Llanto intenso y fuerte.  Gritos. INFORMACIN BSICA SOBRE LA LACTANCIA MATERNA Iniciacin de la lactancia materna   Encuentre un lugar cmodo para sentarse o acostarse, con un buen respaldo para el cuello y la espalda.  Coloque una almohada o una manta enrollada debajo del beb para acomodarlo a la altura de la mama (si est sentada).   Las almohadas para amamantar se han diseado especialmente a fin de servir de apoyo para los brazos y el beb mientras amamanta.  Asegrese de que el abdomen del beb est frente al suyo.  Masajee suavemente la mama. Con las yemas de los dedos, masajee la pared del pecho hacia el pezn en un movimiento circular. Esto estimula el flujo de leche. Es posible que deba continuar este movimiento mientras amamanta si la leche fluye lentamente.  Sostenga la mama con el pulgar por arriba del pezn y los otros 4 dedos por debajo de la mama. Asegrese de que los dedos se encuentren lejos del pezn y de la boca del beb.  Empuje suavemente los labios del beb con el pezn o con el dedo.  Cuando la boca del beb se abra lo suficiente, acrquelo rpidamente a la mama e introduzca todo el pezn y la zona oscura que lo rodea (areola), tanto como sea posible, dentro de la boca del beb.  Debe  haber ms areola visible por arriba del labio superior del beb que por debajo del labio inferior.  La lengua del beb debe estar entre la enca inferior y la mama.  Asegrese de que la boca del beb est en la posicin correcta alrededor del pezn (prendida). Los labios del beb deben crear un sello sobre la mama y estar doblados hacia afuera (invertidos).  Es comn que el beb succione durante 2 a 3 minutos para que comience el flujo de leche materna. Cmo debe prenderse  Es muy importante que le ensee al beb cmo prenderse adecuadamente a la mama. Si el beb no se prende adecuadamente, puede causarle dolor en el pezn y reducir la produccin de leche materna, y hacer que el beb tenga un escaso aumento de peso. Adems, si el beb no se prende adecuadamente al pezn, puede tragar aire durante la alimentacin. Esto puede causarle molestias al beb. Hacer eructar al beb al cambiar de mama puede ayudarlo a liberar el aire. Sin embargo, ensearle al beb cmo prenderse a la mama adecuadamente es la mejor manera de evitar que se sienta molesto por tragar aire mientras se alimenta. Signos de que el beb se ha prendido adecuadamente al pezn:  Tironea o succiona de modo silencioso, sin causarle dolor.  Se escucha que traga cada 3 o 4 succiones.  Hay movimientos musculares por arriba y por delante de sus odos al succionar. Signos de que el beb no se ha prendido adecuadamente al pezn:  Hace ruidos de succin o de chasquido mientras se alimenta.  Siente dolor en el pezn. Si cree que el beb no se prendi correctamente, deslice el dedo en la comisura de la boca y colquelo entre las encas del beb para interrumpir la succin. Intente comenzar a amamantar nuevamente. Signos de lactancia materna exitosa  Signos del beb:  Disminuye gradualmente el nmero de succiones o cesa la succin por completo.  Se duerme.  Relaja el cuerpo.  Retiene una pequea cantidad de leche en la boca.  Se  desprende solo del pecho. Signos que presenta usted:  Las mamas han aumentado la firmeza, el peso y el tamao 1 a 3 horas despus de amamantar.  Estn ms blandas inmediatamente despus de amamantar.  Un aumento del volumen de leche, y tambin un cambio en su consistencia y color se producen hacia el quinto da de lactancia materna.  Los pezones no duelen, ni estn agrietados ni sangran. Signos de que su beb recibe la cantidad de leche suficiente   Mojar   por lo menos 1 o 2 paales durante las primeras 24 horas despus del nacimiento.  Mojar por lo menos 5 o 6 paales cada 24 horas durante la primera semana despus del nacimiento. La orina debe ser transparente o de color amarillo plido a los 5 das despus del nacimiento.  Mojar entre 6 y 8 paales cada 24 horas a medida que el beb sigue creciendo y desarrollndose.  Defeca al menos 3 veces en 24 horas a los 5 das de vida. La materia fecal debe ser blanda y amarillenta.  Defeca al menos 3 veces en 24 horas a los 7 das de vida. La materia fecal debe ser grumosa y amarillenta.  No registra una prdida de peso mayor del 10% del peso al nacer durante los primeros 3 das de vida.  Aumenta de peso un promedio de 4 a 7onzas (113 a 198g) por semana despus de los 4 das de vida.  Aumenta de peso, diariamente, de manera uniforme a partir de los 5 das de vida, sin registrar prdida de peso despus de las 2semanas de vida. Despus de alimentarse, es posible que el beb regurgite una pequea cantidad. Esto es frecuente. FRECUENCIA Y DURACIN DE LA LACTANCIA MATERNA El amamantamiento frecuente la ayudar a producir ms leche y a prevenir problemas de dolor en los pezones e hinchazn en las mamas. Alimente al beb cuando muestre signos de hambre o si siente la necesidad de reducir la congestin de las mamas. Esto se denomina "lactancia a demanda". Evite el uso del chupete mientras trabaja para establecer la lactancia (las primeras 4 a 6  semanas despus del nacimiento del beb). Despus de este perodo, podr ofrecerle un chupete. Las investigaciones demostraron que el uso del chupete durante el primer ao de vida del beb disminuye el riesgo de desarrollar el sndrome de muerte sbita del lactante (SMSL). Permita que el nio se alimente en cada mama todo lo que desee. Contine amamantando al beb hasta que haya terminado de alimentarse. Cuando el beb se desprende o se queda dormido mientras se est alimentando de la primera mama, ofrzcale la segunda. Debido a que, con frecuencia, los recin nacidos permanecen somnolientos las primeras semanas de vida, es posible que deba despertar al beb para alimentarlo. Los horarios de lactancia varan de un beb a otro. Sin embargo, las siguientes reglas pueden servir como gua para ayudarla a garantizar que el beb se alimenta adecuadamente:  Se puede amamantar a los recin nacidos (bebs de 4 semanas o menos de vida) cada 1 a 3 horas.  No deben transcurrir ms de 3 horas durante el da o 5 horas durante la noche sin que se amamante a los recin nacidos.  Debe amamantar al beb 8 veces como mnimo en un perodo de 24 horas, hasta que comience a introducir slidos en su dieta, a los 6 meses de vida aproximadamente. EXTRACCIN DE LECHE MATERNA La extraccin y el almacenamiento de la leche materna le permiten asegurarse de que el beb se alimente exclusivamente de leche materna, aun en momentos en los que no puede amamantar. Esto tiene especial importancia si debe regresar al trabajo en el perodo en que an est amamantando o si no puede estar presente en los momentos en que el beb debe alimentarse. Su asesor en lactancia puede orientarla sobre cunto tiempo es seguro almacenar leche materna. El sacaleche es un aparato que le permite extraer leche de la mama a un recipiente estril. Luego, la leche materna extrada puede almacenarse en un refrigerador o congelador.   Algunos sacaleches son manuales,  mientras que otros son elctricos. Consulte a su asesor en lactancia qu tipo ser ms conveniente para usted. Los sacaleches se pueden comprar; sin embargo, algunos hospitales y grupos de apoyo a la lactancia materna alquilan sacaleches mensualmente. Un asesor en lactancia puede ensearle cmo extraer leche materna manualmente, en caso de que prefiera no usar un sacaleche. CMO CUIDAR LAS MAMAS DURANTE LA LACTANCIA MATERNA Los pezones se secan, agrietan y duelen durante la lactancia materna. Las siguientes recomendaciones pueden ayudarla a mantener las mamas humectadas y sanas:  Evite usar jabn en los pezones.  Use un sostn de soporte. Aunque no son esenciales, las camisetas sin mangas o los sostenes especiales para amamantar estn diseados para acceder fcilmente a las mamas, para amamantar sin tener que quitarse todo el sostn o la camiseta. Evite usar sostenes con aro o sostenes muy ajustados.  Seque al aire sus pezones durante 3 a 4minutos despus de amamantar al beb.  Utilice solo apsitos de algodn en el sostn para absorber las prdidas de leche. La prdida de un poco de leche materna entre las tomas es normal.  Utilice lanolina sobre los pezones luego de amamantar. La lanolina ayuda a mantener la humedad normal de la piel. Si usa lanolina pura, no tiene que lavarse los pezones antes de volver a alimentar al beb. La lanolina pura no es txica para el beb. Adems, puede extraer manualmente algunas gotas de leche materna y masajear suavemente esa leche sobre los pezones, para que la leche se seque al aire. Durante las primeras semanas despus de dar a luz, algunas mujeres pueden experimentar hinchazn en las mamas (congestin mamaria). La congestin puede hacer que sienta las mamas pesadas, calientes y sensibles al tacto. El pico de la congestin ocurre dentro de los 3 a 5 das despus del parto. Las siguientes recomendaciones pueden ayudarla a aliviar la congestin:  Vace por completo  las mamas al amamantar o extraer leche. Puede aplicar calor hmedo en las mamas (en la ducha o con toallas hmedas para manos) antes de amamantar o extraer leche. Esto aumenta la circulacin y ayuda a que la leche fluya. Si el beb no vaca por completo las mamas cuando lo amamanta, extraiga la leche restante despus de que haya finalizado.  Use un sostn ajustado (para amamantar o comn) o una camiseta sin mangas durante 1 o 2 das para indicar al cuerpo que disminuya ligeramente la produccin de leche.  Aplique compresas de hielo sobre las mamas, a menos que le resulte demasiado incmodo.  Asegrese de que el beb est prendido y se encuentre en la posicin correcta mientras lo alimenta. Si la congestin persiste luego de 48 horas o despus de seguir estas recomendaciones, comunquese con su mdico o un asesor en lactancia. RECOMENDACIONES GENERALES PARA EL CUIDADO DE LA SALUD DURANTE LA LACTANCIA MATERNA  Consuma alimentos saludables. Alterne comidas y colaciones, y coma 3 de cada una por da. Dado que lo que come afecta la leche materna, es posible que algunas comidas hagan que su beb se vuelva ms irritable de lo habitual. Evite comer este tipo de alimentos si percibe que afectan de manera negativa al beb.  Beba leche, jugos de fruta y agua para satisfacer su sed (aproximadamente 10 vasos al da).  Descanse con frecuencia, reljese y tome sus vitaminas prenatales para evitar la fatiga, el estrs y la anemia.  Contine con los autocontroles de la mama.  Evite masticar y fumar tabaco. Las sustancias qumicas de los cigarrillos que pasan   a la leche materna y la exposicin al humo ambiental del tabaco pueden daar al beb.  No consuma alcohol ni drogas, incluida la marihuana. Algunos medicamentos, que pueden ser perjudiciales para el beb, pueden pasar a travs de la leche materna. Es importante que consulte a su mdico antes de tomar cualquier medicamento, incluidos todos los medicamentos  recetados y de venta libre, as como los suplementos vitamnicos y herbales. Puede quedar embarazada durante la lactancia. Si desea controlar la natalidad, consulte a su mdico cules son las opciones ms seguras para el beb. SOLICITE ATENCIN MDICA SI:  Usted siente que quiere dejar de amamantar o se siente frustrada con la lactancia.  Siente dolor en las mamas o en los pezones.  Sus pezones estn agrietados o sangran.  Sus pechos estn irritados, sensibles o calientes.  Tiene un rea hinchada en cualquiera de las mamas.  Siente escalofros o fiebre.  Tiene nuseas o vmitos.  Presenta una secrecin de otro lquido distinto de la leche materna de los pezones.  Sus mamas no se llenan antes de amamantar al beb para el quinto da despus del parto.  Se siente triste y deprimida.  El beb est demasiado somnoliento como para comer bien.  El beb tiene problemas para dormir.  Moja menos de 3 paales en 24 horas.  Defeca menos de 3 veces en 24 horas.  La piel del beb o la parte blanca de los ojos se vuelven amarillentas.  El beb no ha aumentado de peso a los 5 das de vida. SOLICITE ATENCIN MDICA DE INMEDIATO SI:  El beb est muy cansado (letargo) y no se quiere despertar para comer.  Le sube la fiebre sin causa. Esta informacin no tiene como fin reemplazar el consejo del mdico. Asegrese de hacerle al mdico cualquier pregunta que tenga. Document Released: 02/06/2005 Document Revised: 05/31/2015 Document Reviewed: 07/31/2012 Elsevier Interactive Patient Education  2017 [employer]  

## 2017-02-02 NOTE — Progress Notes (Signed)
Spanish interpreter: Alcario DroughtErica used    PRENATAL VISIT NOTE  Subjective:  Julie CatalanGlendy Mas Brooks is a 31 y.o. [phone] at 168w2d being seen today for transferring prenatal care. [facility] and had h/o PTD, needs Makena. Early glucola positive, and failed 3 hour. Saw Beverly yesterday and has adjusted diet.  She is currently monitored for the following issues for this high-risk pregnancy and has Supervision of high risk pregnancy, antepartum; History of preterm delivery, currently pregnant; and Gestational diabetes on their problem list.  Patient reports no complaints.  Contractions: Not present. Vag. Bleeding: None.  Movement: Present. Denies leaking of fluid.   The following portions of the patient's history were reviewed and updated as appropriate: allergies, current medications, past family history, past medical history, past social history, past surgical history and problem list. Problem list updated.  Objective:   Vitals:   02/02/17 0823  BP: (!) 238/73  Pulse: (!) 101  Weight: 180 lb 14.4 oz (82.1 kg)    Fetal Status: Fetal Heart Rate (bpm): 152 Fundal Height: 19 cm Movement: Present     General:  Alert, oriented and cooperative. Patient is in no acute distress.  Skin: Skin is warm and dry. No rash noted.   Cardiovascular: Normal heart rate noted  Respiratory: Normal respiratory effort, no problems with respiration noted  Abdomen: Soft, gravid, appropriate for gestational age.  Pain/Pressure: Present     Pelvic: Cervical exam deferred        Extremities: Normal range of motion.  Edema: None  Mental Status:  Normal mood and affect. Normal behavior. Normal judgment and thought content.  FBS 86 2 hour pp 92, 109, 93 Assessment and Plan:  Pregnancy: [id number] at 4768w2d  1. Supervision of high risk pregnancy, antepartum Needs anatomy--scheduled 02/14/17  2. History of preterm delivery, currently pregnant Makena ordered  3. Diet controlled gestational diabetes mellitus (GDM) in  second trimester Baseline labs Begin ASA If HgbA1C is  6.5-->needs fetal echo and optho referral. - aspirin EC 81 MG tablet; Take 1 tablet (81 mg total) by mouth daily.  Dispense: 90 tablet; Refill: 3 - Comprehensive metabolic panel - Protein / creatinine ratio, urine - TSH - Hemoglobin A1c  General obstetric precautions including but not limited to vaginal bleeding, contractions, leaking of fluid and fetal movement were reviewed in detail with the patient. Please refer to After Visit Summary for other counseling recommendations.   Return in about 2 weeks (around 02/16/2017) for 17 P weekly.   Reva Boresanya S Kamrin Spath, MD

## 2017-02-03 LAB — COMPREHENSIVE METABOLIC PANEL
A/G RATIO: 1.5 (ref 1.2–2.2)
ALBUMIN: 3.9 g/dL (ref 3.5–5.5)
ALT: 11 IU/L (ref 0–32)
AST: 12 IU/L (ref 0–40)
Alkaline Phosphatase: 46 IU/L (ref 39–117)
BUN/Creatinine Ratio: 13 (ref 9–23)
BUN: 7 mg/dL (ref 6–20)
Bilirubin Total: <0.2 mg/dL (ref 0.0–1.2)
CALCIUM: 9.7 mg/dL (ref 8.7–10.2)
CO2: 22 mmol/L (ref 20–29)
Chloride: 101 mmol/L (ref 96–106)
Creatinine, Ser: 0.52 mg/dL — ABNORMAL LOW (ref 0.57–1.00)
GFR, EST AFRICAN AMERICAN: 145 mL/min/{1.73_m2} (ref >59)
GFR, EST NON AFRICAN AMERICAN: 126 mL/min/{1.73_m2} (ref >59)
GLOBULIN, TOTAL: 2.6 g/dL (ref 1.5–4.5)
Glucose: 92 mg/dL (ref 65–99)
POTASSIUM: 3.6 mmol/L (ref 3.5–5.2)
SODIUM: 136 mmol/L (ref 134–144)
TOTAL PROTEIN: 6.5 g/dL (ref 6.0–8.5)

## 2017-02-03 LAB — HEMOGLOBIN A1C
ESTIMATED AVERAGE GLUCOSE: 108 mg/dL
HEMOGLOBIN A1C: 5.4 % (ref 4.8–5.6)

## 2017-02-03 LAB — PROTEIN / CREATININE RATIO, URINE
Creatinine, Urine: 182 mg/dL
PROTEIN UR: 21.1 mg/dL
Protein/Creat Ratio: 116 mg/g creat (ref 0–200)

## 2017-02-03 LAB — TSH: TSH: 1.7 u[IU]/mL (ref 0.450–4.500)

## 2017-02-05 ENCOUNTER — Encounter: Payer: Self-pay | Admitting: Family Medicine

## 2017-02-05 ENCOUNTER — Encounter (HOSPITAL_COMMUNITY): Payer: Self-pay | Admitting: Nurse Practitioner

## 2017-02-05 ENCOUNTER — Other Ambulatory Visit: Payer: Self-pay | Admitting: Family Medicine

## 2017-02-05 DIAGNOSIS — O0990 Supervision of high risk pregnancy, unspecified, unspecified trimester: Secondary | ICD-10-CM

## 2017-02-05 DIAGNOSIS — O24410 Gestational diabetes mellitus in pregnancy, diet controlled: Secondary | ICD-10-CM

## 2017-02-07 ENCOUNTER — Telehealth: Payer: Self-pay

## 2017-02-07 DIAGNOSIS — O280 Abnormal hematological finding on antenatal screening of mother: Secondary | ICD-10-CM

## 2017-02-07 NOTE — Telephone Encounter (Addendum)
Received abnormal QUAD screen from [facility], 1:18 for positive risk for Down's Syndrome.   Scheduled genetic counseling for 02/16/17 with her US and 17p appt.   Notified pt with Spanish Interpreter Marchelle Folksmanda results of QUAD pt begins to cry.  I informed pt that her US and genetic counseling appt will explain in detail what those  abnormal results mean.  Pt then asks if she should come to her appt scheduled for 02/09/17 which is for 17p.  I advised pt to not come to that appt until we call her when her 17p medication comes in.  Pt stated understanding.

## 2017-02-09 ENCOUNTER — Ambulatory Visit: Payer: Self-pay

## 2017-02-14 ENCOUNTER — Telehealth: Payer: Self-pay | Admitting: *Deleted

## 2017-02-14 ENCOUNTER — Ambulatory Visit (HOSPITAL_COMMUNITY): Payer: Self-pay

## 2017-02-14 NOTE — Telephone Encounter (Signed)
Received makena . Called Yovana with [employer]#[id number] and notified her Cruzita Lederermakena came in ; she already has appt for 17p for 02/16/17 and instructed her to keep that appointment. She voices understanding.

## 2017-02-16 ENCOUNTER — Encounter (HOSPITAL_COMMUNITY): Payer: Self-pay

## 2017-02-16 ENCOUNTER — Ambulatory Visit (HOSPITAL_COMMUNITY): Admission: RE | Admit: 2017-02-16 | Payer: Self-pay | Source: Ambulatory Visit

## 2017-02-16 ENCOUNTER — Ambulatory Visit (INDEPENDENT_AMBULATORY_CARE_PROVIDER_SITE_OTHER): Payer: Medicaid Other | Admitting: General Practice

## 2017-02-16 ENCOUNTER — Other Ambulatory Visit (HOSPITAL_COMMUNITY): Payer: Self-pay | Admitting: Nurse Practitioner

## 2017-02-16 ENCOUNTER — Other Ambulatory Visit (HOSPITAL_COMMUNITY): Payer: Self-pay

## 2017-02-16 ENCOUNTER — Ambulatory Visit (HOSPITAL_COMMUNITY)
Admission: RE | Admit: 2017-02-16 | Discharge: 2017-02-16 | Disposition: A | Discharge status: Home / Self Care | Payer: Self-pay | Source: Ambulatory Visit | Attending: Nurse Practitioner | Admitting: Nurse Practitioner

## 2017-02-16 ENCOUNTER — Other Ambulatory Visit (HOSPITAL_COMMUNITY): Payer: Self-pay | Admitting: *Deleted

## 2017-02-16 VITALS — BP 121/72 | HR 101 | Ht 63.0 in | Wt 180.0 lb

## 2017-02-16 DIAGNOSIS — Z363 Encounter for antenatal screening for malformations: Secondary | ICD-10-CM

## 2017-02-16 DIAGNOSIS — O09219 Supervision of pregnancy with history of pre-term labor, unspecified trimester: Principal | ICD-10-CM

## 2017-02-16 DIAGNOSIS — O09899 Supervision of other high risk pregnancies, unspecified trimester: Secondary | ICD-10-CM

## 2017-02-16 DIAGNOSIS — O09212 Supervision of pregnancy with history of pre-term labor, second trimester: Secondary | ICD-10-CM | POA: Diagnosis present

## 2017-02-16 DIAGNOSIS — O289 Unspecified abnormal findings on antenatal screening of mother: Secondary | ICD-10-CM | POA: Insufficient documentation

## 2017-02-16 DIAGNOSIS — Z3689 Encounter for other specified antenatal screening: Secondary | ICD-10-CM | POA: Insufficient documentation

## 2017-02-16 DIAGNOSIS — Z3A20 20 weeks gestation of pregnancy: Secondary | ICD-10-CM

## 2017-02-16 DIAGNOSIS — Z3A16 16 weeks gestation of pregnancy: Secondary | ICD-10-CM

## 2017-02-16 DIAGNOSIS — Z0489 Encounter for examination and observation for other specified reasons: Secondary | ICD-10-CM

## 2017-02-16 DIAGNOSIS — IMO0002 Reserved for concepts with insufficient information to code with codable children: Secondary | ICD-10-CM

## 2017-02-16 HISTORY — DX: Gestational diabetes mellitus in pregnancy, unspecified control: O24.419

## 2017-02-16 LAB — QUAD SCREEN FOR MFM

## 2017-02-16 MED ORDER — HYDROXYPROGESTERONE CAPROATE 275 MG/1.1ML ~~LOC~~ SOAJ
275.0000 mg | SUBCUTANEOUS | Status: AC
  Administered 2017-02-16 – 2017-06-07 (×13): 275 mg via SUBCUTANEOUS

## 2017-02-16 NOTE — Addendum Note (Signed)
Encounter addended by: Reita ChardPaschal, Antwan Bribiesca R, RN on: 02/16/2017 11:20 AM  Actions taken: OB Dating navigator section updated

## 2017-02-18 NOTE — Progress Notes (Signed)
Agree with nursing staff's documentation of this patient's clinic encounter.  Kadir Azucena, MD 02/18/2017 10:08 AM    

## 2017-02-20 NOTE — L&D Delivery Note (Signed)
Delivery Note At 3:55 PM a viable female was delivered via Vaginal, Spontaneous.  Presentation: compound; Position: Left,, Occiput,, Anterior;   APGAR: 9, 9; weight pending.   Placenta status: Retained - Failed to deliver spontaneously after 35 minutes of gentle traction. Risks and benefits of manual extraction discussed with patient, including retained POC and hemorrhage versus risk of infection. Verbal consent obtained from patient. Placenta then manually extracted, intact, following manual sweep. Ancef given Cord: 3 vessel, no nucal   Anesthesia:  Epidural, effective Instruments: none Episiotomy: None Lacerations:  None Suture Repair: none Est. Blood Loss (mL):  - Methergin given  Mom to postpartum.  Baby to Couplet care / Skin to Skin.  Felisa Bonier, MD, PGY-1 Family Medicine [facility] [place] 06/25/2017, 4:48 PM

## 2017-02-22 ENCOUNTER — Ambulatory Visit (INDEPENDENT_AMBULATORY_CARE_PROVIDER_SITE_OTHER): Payer: Self-pay | Admitting: Family Medicine

## 2017-02-22 ENCOUNTER — Other Ambulatory Visit (HOSPITAL_COMMUNITY): Payer: Self-pay

## 2017-02-22 VITALS — BP 116/71 | HR 89 | Wt 181.3 lb

## 2017-02-22 DIAGNOSIS — O0990 Supervision of high risk pregnancy, unspecified, unspecified trimester: Secondary | ICD-10-CM

## 2017-02-22 DIAGNOSIS — Z603 Acculturation difficulty: Secondary | ICD-10-CM

## 2017-02-22 DIAGNOSIS — O09899 Supervision of other high risk pregnancies, unspecified trimester: Secondary | ICD-10-CM

## 2017-02-22 DIAGNOSIS — Z789 Other specified health status: Secondary | ICD-10-CM | POA: Insufficient documentation

## 2017-02-22 DIAGNOSIS — O09219 Supervision of pregnancy with history of pre-term labor, unspecified trimester: Secondary | ICD-10-CM

## 2017-02-22 NOTE — Progress Notes (Signed)
Pt.states pain in lower back.17-p Administered.

## 2017-02-22 NOTE — Patient Instructions (Signed)
Diagnstico de diabetes mellitus gestacional  (Gestational Diabetes Mellitus, Diagnosis)  La diabetes gestacional (diabetes mellitus gestacional) es una forma de diabetes a corto plazo (temporal) que puede aparecer durante el embarazo. Este cuadro desaparece despus del parto. Puede deberse a uno de estos problemas o a ambos:   El cuerpo no produce la cantidad suficiente de una hormona llamada insulina.   El cuerpo no responde de forma normal a la insulina que produce.  La insulina permite que los ciertos azcares (glucosa) ingresen a las clulas del cuerpo. Esto le proporciona la energa. Cuando se tiene diabetes, la glucosa no puede ingresar a las clulas. Esto produce un aumento del nivel de glucosa en la sangre (hiperglucemia).  Si la diabetes se trata, es posible que ni usted ni el beb se vean afectados. El mdico fijar los objetivos del tratamiento para usted. Generalmente, los resultados de la glucemia deben ser los siguientes:   Despus de no comer durante mucho tiempo (ayunar): 95mg/dl (5,3mmol/l).   Despus de las comidas (posprandial):  ? Una hora despus de una comida: igual o menor que 140mg/dl (7,8mmol/l).  ? Dos horas despus de una comida: igual o menor que 120mg/dl (6,7mmol/l).   Nivel de A1c (hemoglobinaA1c): del 6% al 6,5%.  CUIDADOS EN EL HOGAR  Preguntas para hacerle al mdico  Puede hacer las siguientes preguntas:   Debo reunirme con un instructor para el cuidado de la diabetes?   Dnde puedo encontrar un grupo de apoyo para personas diabticas?   Qu equipos necesitar para cuidarme en casa?   Qu medicamentos para la diabetes necesito? Cundo debo tomarlos?   Con qu frecuencia debo controlarme el nivel de glucosa en la sangre?   A qu nmero puedo llamar si tengo preguntas?   Cundo es la prxima cita con el mdico?  Instrucciones generales   Tome los medicamentos de venta libre y los recetados solamente como se lo haya indicado el mdico.   Mantenga un peso  saludable durante el embarazo.   Concurra a todas las visitas de control como se lo haya indicado el mdico. Esto es importante.  SOLICITE AYUDA SI:   Su nivel de glucosa en la sangre es igual o superior a 240mg/dl (13,3mmol/dl).   Su nivel de glucosa en la sangre es igual o superior a 200mg/dl (11,1mmol/l), y tiene cetonas en la orina.   Ha estado enferma o ha tenido fiebre durante 2o ms das y no mejora.   Si tiene alguno de estos problemas durante ms de 6horas:  ? No puede comer ni beber.  ? Siente malestar estomacal (nuseas).  ? Vomita.  ? La materia fecal es lquida (diarrea).  SOLICITE AYUDA DE INMEDIATO SI:   El nivel de glucosa en la sangre est por debajo de 54mg/dl (3mmol/l).   Est confundida.   Tiene dificultad para hacer lo siguiente:  ? Pensar con claridad.  ? Respirar.   El beb se mueve menos de lo normal.   Tiene los siguientes sntomas:  ? Niveles moderados o altos de cetonas en la orina.  ? Hemorragia vaginal.  ? Secrecin de un lquido fuera de lo comn de la vagina.  ? Contracciones prematuras. Estas pueden causar una sensacin de opresin en el vientre.  Esta informacin no tiene como fin reemplazar el consejo del mdico. Asegrese de hacerle al mdico cualquier pregunta que tenga.  Document Released: 05/31/2015 Document Revised: 05/31/2015 Document Reviewed: 03/12/2015  Elsevier Interactive Patient Education  2018 Elsevier Inc.

## 2017-02-22 NOTE — Progress Notes (Signed)
   PRENATAL VISIT NOTE Spanish interpreter: [name] used  Subjective:  Julie Brooks is a 32 y.o. [phone] at 14103w3d being seen today for ongoing prenatal care.  She is currently monitored for the following issues for this high-risk pregnancy and has Supervision of high risk pregnancy, antepartum; History of preterm delivery, currently pregnant; Gestational diabetes; and Language barrier on their problem list.  Patient reports no complaints.  Contractions: Not present. Vag. Bleeding: None.  Movement: (!) Decreased. Denies leaking of fluid.   The following portions of the patient's history were reviewed and updated as appropriate: allergies, current medications, past family history, past medical history, past social history, past surgical history and problem list. Problem list updated.  Objective:   Vitals:   02/22/17 1318  BP: 116/71  Pulse: 89  Weight: 181 lb 4.8 oz (82.2 kg)    Fetal Status: Fetal Heart Rate (bpm): 147   Movement: (!) Decreased     General:  Alert, oriented and cooperative. Patient is in no acute distress.  Skin: Skin is warm and dry. No rash noted.   Cardiovascular: Normal heart rate noted  Respiratory: Normal respiratory effort, no problems with respiration noted  Abdomen: Soft, gravid, appropriate for gestational age.  Pain/Pressure: Present     Pelvic: Cervical exam deferred        Extremities: Normal range of motion.  Edema: None  Mental Status:  Normal mood and affect. Normal behavior. Normal judgment and thought content.  FBS 82-116 (6 of 14 are out of range) 2 hour pp 91-145 (11 of 45 out of range)--notes fastings are higher if not having hs snack Assessment and Plan:  Pregnancy: [id number] at 35103w3d  1. Supervision of high risk pregnancy, antepartum Last quad WNL Has anatomy u/s scheduled  2. History of preterm delivery, currently pregnant On 17 P  3. Language barrier  4. Diabetes in pregnancy Add exercise and continue diet--consider meds if no  improvement in glycemic control next visit. Add protein to bedtime snack.  General obstetric precautions including but not limited to vaginal bleeding, contractions, leaking of fluid and fetal movement were reviewed in detail with the patient. Please refer to After Visit Summary for other counseling recommendations.  Return in about 3 weeks (around 03/15/2017) for needs MD, 17 P weekly.   Reva Boresanya S Waleska Buttery, MD

## 2017-03-02 ENCOUNTER — Ambulatory Visit: Payer: Self-pay | Admitting: General Practice

## 2017-03-02 VITALS — BP 93/50 | HR 85 | Ht 63.0 in | Wt 181.0 lb

## 2017-03-02 DIAGNOSIS — O09219 Supervision of pregnancy with history of pre-term labor, unspecified trimester: Principal | ICD-10-CM

## 2017-03-02 DIAGNOSIS — O09899 Supervision of other high risk pregnancies, unspecified trimester: Secondary | ICD-10-CM

## 2017-03-02 NOTE — Progress Notes (Signed)
17p given 

## 2017-03-08 ENCOUNTER — Encounter (HOSPITAL_COMMUNITY): Payer: Self-pay

## 2017-03-09 ENCOUNTER — Other Ambulatory Visit (HOSPITAL_COMMUNITY): Payer: Self-pay | Admitting: Obstetrics and Gynecology

## 2017-03-09 ENCOUNTER — Ambulatory Visit: Payer: Self-pay | Admitting: *Deleted

## 2017-03-09 ENCOUNTER — Encounter (HOSPITAL_COMMUNITY): Payer: Self-pay

## 2017-03-09 ENCOUNTER — Ambulatory Visit (HOSPITAL_COMMUNITY)
Admission: RE | Admit: 2017-03-09 | Discharge: 2017-03-09 | Disposition: A | Discharge status: Home / Self Care | Payer: Self-pay | Source: Ambulatory Visit | Attending: Family Medicine | Admitting: Family Medicine

## 2017-03-09 VITALS — BP 103/53 | HR 90

## 2017-03-09 DIAGNOSIS — Z3A19 19 weeks gestation of pregnancy: Secondary | ICD-10-CM

## 2017-03-09 DIAGNOSIS — O09219 Supervision of pregnancy with history of pre-term labor, unspecified trimester: Principal | ICD-10-CM

## 2017-03-09 DIAGNOSIS — O281 Abnormal biochemical finding on antenatal screening of mother: Secondary | ICD-10-CM

## 2017-03-09 DIAGNOSIS — O09899 Supervision of other high risk pregnancies, unspecified trimester: Secondary | ICD-10-CM

## 2017-03-09 DIAGNOSIS — O09212 Supervision of pregnancy with history of pre-term labor, second trimester: Secondary | ICD-10-CM

## 2017-03-09 DIAGNOSIS — IMO0002 Reserved for concepts with insufficient information to code with codable children: Secondary | ICD-10-CM

## 2017-03-09 DIAGNOSIS — O0990 Supervision of high risk pregnancy, unspecified, unspecified trimester: Secondary | ICD-10-CM

## 2017-03-09 DIAGNOSIS — O24410 Gestational diabetes mellitus in pregnancy, diet controlled: Secondary | ICD-10-CM

## 2017-03-09 DIAGNOSIS — Z362 Encounter for other antenatal screening follow-up: Secondary | ICD-10-CM

## 2017-03-09 DIAGNOSIS — Z0489 Encounter for examination and observation for other specified reasons: Secondary | ICD-10-CM

## 2017-03-09 NOTE — Progress Notes (Signed)
Here for 17p. States on Wednesday fainted at home. States was cleaning around the home like usual and was cooking and felt like was going to faint and husband caught her. States lasted 5 seconds. States otherwise doing ok, feels baby moving sometimes.   Discussed with Dr. Marice Potterove and instructed patient this happens sometimes, we advise her to be sure she is drinking a lot of fluids, especially water and resting as she can. She voices understanding.  Explained to her I do not have her medicine to give today, will call company and order. Asked her to call us Tuesday and see if it came. Would like to give her before her next appt 03/16/17.

## 2017-03-13 ENCOUNTER — Other Ambulatory Visit (HOSPITAL_COMMUNITY): Payer: Self-pay | Admitting: *Deleted

## 2017-03-13 DIAGNOSIS — O09219 Supervision of pregnancy with history of pre-term labor, unspecified trimester: Secondary | ICD-10-CM

## 2017-03-13 DIAGNOSIS — Z362 Encounter for other antenatal screening follow-up: Secondary | ICD-10-CM

## 2017-03-16 ENCOUNTER — Encounter: Payer: Self-pay | Admitting: Obstetrics and Gynecology

## 2017-03-16 ENCOUNTER — Ambulatory Visit (INDEPENDENT_AMBULATORY_CARE_PROVIDER_SITE_OTHER): Payer: Self-pay | Admitting: Obstetrics and Gynecology

## 2017-03-16 VITALS — BP 107/52 | HR 74 | Wt 180.6 lb

## 2017-03-16 DIAGNOSIS — O0990 Supervision of high risk pregnancy, unspecified, unspecified trimester: Secondary | ICD-10-CM

## 2017-03-16 DIAGNOSIS — O09219 Supervision of pregnancy with history of pre-term labor, unspecified trimester: Secondary | ICD-10-CM

## 2017-03-16 DIAGNOSIS — O09899 Supervision of other high risk pregnancies, unspecified trimester: Secondary | ICD-10-CM

## 2017-03-16 DIAGNOSIS — Z758 Other problems related to medical facilities and other health care: Secondary | ICD-10-CM

## 2017-03-16 DIAGNOSIS — O0992 Supervision of high risk pregnancy, unspecified, second trimester: Secondary | ICD-10-CM

## 2017-03-16 DIAGNOSIS — Z789 Other specified health status: Secondary | ICD-10-CM

## 2017-03-16 DIAGNOSIS — O24410 Gestational diabetes mellitus in pregnancy, diet controlled: Secondary | ICD-10-CM

## 2017-03-16 DIAGNOSIS — O09212 Supervision of pregnancy with history of pre-term labor, second trimester: Secondary | ICD-10-CM

## 2017-03-16 MED ORDER — HYDROCORTISONE 0.5 % EX OINT
1.0000 | TOPICAL_OINTMENT | Freq: Two times a day (BID) | CUTANEOUS | 1 refills | Status: DC
Start: 2017-03-16 — End: 2017-04-09

## 2017-03-16 MED ORDER — GLYBURIDE 2.5 MG PO TABS: 2.5000 mg | ORAL_TABLET | Freq: Every day | ORAL | 3 refills | Status: DC

## 2017-03-16 NOTE — Progress Notes (Signed)
Subjective:  Julie CatalanGlendy Mas Esquivel is a 32 y.o. [phone] at 2050w4d being seen today for ongoing prenatal care.  She is currently monitored for the following issues for this high-risk pregnancy and has Supervision of high risk pregnancy, antepartum; History of preterm delivery, currently pregnant; Gestational diabetes; and Language barrier on their problem list.  Patient reports some itching on abd, thinks related to heat.  Contractions: Not present. Vag. Bleeding: None.  Movement: Present. Denies leaking of fluid.   The following portions of the patient's history were reviewed and updated as appropriate: allergies, current medications, past family history, past medical history, past social history, past surgical history and problem list. Problem list updated.  Objective:   Vitals:   03/16/17 1044  BP: (!) 107/52  Pulse: 74  Weight: 180 lb 9.6 oz (81.9 kg)    Fetal Status: Fetal Heart Rate (bpm): 154   Movement: Present     General:  Alert, oriented and cooperative. Patient is in no acute distress.  Skin: Skin is warm and dry. No rash noted.   Cardiovascular: Normal heart rate noted  Respiratory: Normal respiratory effort, no problems with respiration noted  Abdomen: Soft, gravid, appropriate for gestational age. Pain/Pressure: Absent     Pelvic:  Cervical exam deferred        Extremities: Normal range of motion.  Edema: None  Mental Status: Normal mood and affect. Normal behavior. Normal judgment and thought content.   Urinalysis:      Assessment and Plan:  Pregnancy: [id number] at 6450w4d  1. Supervision of high risk pregnancy, antepartum Stable Not for lifting at work provided to pt.  2. Language barrier Interrupter services used  3. History of preterm delivery, currently pregnant Stable CL next week Continue with 17 OHP  4. Diet controlled gestational diabetes mellitus (GDM) in second trimester Fasting CBG's elevated Will start Glyburide - glyBURIDE (DIABETA) 2.5 MG tablet;  Take 1 tablet (2.5 mg total) by mouth at bedtime.  Dispense: 30 tablet; Refill: 3  Preterm labor symptoms and general obstetric precautions including but not limited to vaginal bleeding, contractions, leaking of fluid and fetal movement were reviewed in detail with the patient. Please refer to After Visit Summary for other counseling recommendations.  Return in about 4 weeks (around 04/13/2017) for OB visit.   Hermina StaggersErvin, Srija Southard L, MD

## 2017-03-16 NOTE — Progress Notes (Signed)
17p given in right arm@11 :10 on 03/16/17

## 2017-03-23 ENCOUNTER — Ambulatory Visit (HOSPITAL_COMMUNITY)
Admission: RE | Admit: 2017-03-23 | Discharge: 2017-03-23 | Disposition: A | Discharge status: Home / Self Care | Payer: Self-pay | Source: Ambulatory Visit | Attending: Family Medicine | Admitting: Family Medicine

## 2017-03-23 ENCOUNTER — Ambulatory Visit (INDEPENDENT_AMBULATORY_CARE_PROVIDER_SITE_OTHER): Payer: Self-pay | Admitting: General Practice

## 2017-03-23 ENCOUNTER — Encounter (HOSPITAL_COMMUNITY): Payer: Self-pay

## 2017-03-23 VITALS — BP 114/45 | HR 98 | Ht 59.0 in | Wt 183.0 lb

## 2017-03-23 DIAGNOSIS — O09219 Supervision of pregnancy with history of pre-term labor, unspecified trimester: Secondary | ICD-10-CM | POA: Insufficient documentation

## 2017-03-23 DIAGNOSIS — O09212 Supervision of pregnancy with history of pre-term labor, second trimester: Secondary | ICD-10-CM

## 2017-03-23 DIAGNOSIS — Z3A21 21 weeks gestation of pregnancy: Secondary | ICD-10-CM | POA: Insufficient documentation

## 2017-03-23 DIAGNOSIS — O09899 Supervision of other high risk pregnancies, unspecified trimester: Secondary | ICD-10-CM

## 2017-03-23 DIAGNOSIS — Z3686 Encounter for antenatal screening for cervical length: Secondary | ICD-10-CM | POA: Insufficient documentation

## 2017-03-23 NOTE — Progress Notes (Signed)
17p given 

## 2017-03-30 ENCOUNTER — Ambulatory Visit (INDEPENDENT_AMBULATORY_CARE_PROVIDER_SITE_OTHER): Payer: Self-pay | Admitting: General Practice

## 2017-03-30 VITALS — BP 110/61 | HR 94

## 2017-03-30 DIAGNOSIS — Z8751 Personal history of pre-term labor: Secondary | ICD-10-CM

## 2017-03-30 DIAGNOSIS — O09212 Supervision of pregnancy with history of pre-term labor, second trimester: Secondary | ICD-10-CM

## 2017-03-30 NOTE — Progress Notes (Signed)
Pt in for makena. Reports good fetal movement. No complaints or concerns today.

## 2017-04-04 ENCOUNTER — Encounter: Payer: Self-pay | Admitting: *Deleted

## 2017-04-04 ENCOUNTER — Ambulatory Visit: Payer: Self-pay | Admitting: *Deleted

## 2017-04-04 VITALS — BP 116/64 | HR 92

## 2017-04-04 DIAGNOSIS — O0990 Supervision of high risk pregnancy, unspecified, unspecified trimester: Secondary | ICD-10-CM

## 2017-04-04 MED ORDER — HYDROXYPROGESTERONE CAPROATE 275 MG/1.1ML ~~LOC~~ SOAJ
275.0000 mg | Freq: Once | SUBCUTANEOUS | Status: AC
  Administered 2017-04-04: 275 mg via SUBCUTANEOUS

## 2017-04-04 NOTE — Progress Notes (Signed)
Pt came in for her MAKENA . Pt stated no complaints or corn today

## 2017-04-04 NOTE — Progress Notes (Signed)
Agree w/ POC.   Katrinka BlazingSmith, [place], CNM 04/04/2017 9:08 AM

## 2017-04-06 ENCOUNTER — Ambulatory Visit (HOSPITAL_COMMUNITY)
Admission: RE | Admit: 2017-04-06 | Discharge: 2017-04-06 | Disposition: A | Discharge status: Home / Self Care | Payer: Self-pay | Source: Ambulatory Visit | Attending: Family Medicine | Admitting: Family Medicine

## 2017-04-06 ENCOUNTER — Other Ambulatory Visit (HOSPITAL_COMMUNITY): Payer: Self-pay | Admitting: Obstetrics and Gynecology

## 2017-04-06 ENCOUNTER — Other Ambulatory Visit (HOSPITAL_COMMUNITY): Payer: Self-pay | Admitting: *Deleted

## 2017-04-06 ENCOUNTER — Encounter (HOSPITAL_COMMUNITY): Payer: Self-pay

## 2017-04-06 DIAGNOSIS — O24415 Gestational diabetes mellitus in pregnancy, controlled by oral hypoglycemic drugs: Secondary | ICD-10-CM

## 2017-04-06 DIAGNOSIS — Z362 Encounter for other antenatal screening follow-up: Secondary | ICD-10-CM

## 2017-04-06 DIAGNOSIS — Z3686 Encounter for antenatal screening for cervical length: Secondary | ICD-10-CM | POA: Insufficient documentation

## 2017-04-06 DIAGNOSIS — Z3A23 23 weeks gestation of pregnancy: Secondary | ICD-10-CM

## 2017-04-06 DIAGNOSIS — O09212 Supervision of pregnancy with history of pre-term labor, second trimester: Secondary | ICD-10-CM | POA: Insufficient documentation

## 2017-04-06 DIAGNOSIS — O09219 Supervision of pregnancy with history of pre-term labor, unspecified trimester: Secondary | ICD-10-CM

## 2017-04-09 ENCOUNTER — Ambulatory Visit (INDEPENDENT_AMBULATORY_CARE_PROVIDER_SITE_OTHER): Payer: Self-pay | Admitting: Obstetrics & Gynecology

## 2017-04-09 VITALS — BP 119/73 | HR 93 | Wt 183.0 lb

## 2017-04-09 DIAGNOSIS — O0990 Supervision of high risk pregnancy, unspecified, unspecified trimester: Secondary | ICD-10-CM

## 2017-04-09 DIAGNOSIS — O24410 Gestational diabetes mellitus in pregnancy, diet controlled: Secondary | ICD-10-CM

## 2017-04-09 MED ORDER — HYDROXYPROGESTERONE CAPROATE 275 MG/1.1ML ~~LOC~~ SOAJ
275.0000 mg | Freq: Once | SUBCUTANEOUS | Status: AC
  Administered 2017-04-09: 275 mg via SUBCUTANEOUS

## 2017-04-09 MED ORDER — HYDROCORTISONE 0.5 % EX OINT: 1.0000 "application " | TOPICAL_OINTMENT | Freq: Two times a day (BID) | CUTANEOUS | 1 refills | Status: DC

## 2017-04-09 NOTE — Progress Notes (Signed)
   PRENATAL VISIT NOTE  Subjective:  Julie Brooks is a 32 y.o. [id number] at 3080w0d being seen today for ongoing prenatal care.  She is currently monitored for the following issues for this high-risk pregnancy and has Supervision of high risk pregnancy, antepartum; History of preterm delivery, currently pregnant; Gestational diabetes; and Language barrier on their problem list.  Patient reports no complaints.  Contractions: Not present. Vag. Bleeding: None.  Movement: Present. Denies leaking of fluid.   The following portions of the patient's history were reviewed and updated as appropriate: allergies, current medications, past family history, past medical history, past social history, past surgical history and problem list. Problem list updated.  Objective:   Vitals:   04/09/17 1451  BP: 119/73  Pulse: 93  Weight: 183 lb (83 kg)    Fetal Status: Fetal Heart Rate (bpm): 154   Movement: Present     General:  Alert, oriented and cooperative. Patient is in no acute distress.  Skin: Skin is warm and dry. No rash noted.   Cardiovascular: Normal heart rate noted  Respiratory: Normal respiratory effort, no problems with respiration noted  Abdomen: Soft, gravid, appropriate for gestational age.  Pain/Pressure: Present     Pelvic: Cervical exam deferred        Extremities: Normal range of motion.  Edema: None  Mental Status:  Normal mood and affect. Normal behavior. Normal judgment and thought content.   Assessment and Plan:  Pregnancy: [id number] at 6580w0d  1.  gestational diabetes mellitus (GDM) in second trimester Continue glyburide dose, BG control is good  2. Supervision of high risk pregnancy, antepartum 17 P weekly  Preterm labor symptoms and general obstetric precautions including but not limited to vaginal bleeding, contractions, leaking of fluid and fetal movement were reviewed in detail with the patient. Please refer to After Visit Summary for other counseling  recommendations.  Return for 17 p weekly.   Scheryl DarterJames Arnold, MD

## 2017-04-09 NOTE — Progress Notes (Signed)
[facility]--

## 2017-04-09 NOTE — Patient Instructions (Signed)

## 2017-04-11 ENCOUNTER — Ambulatory Visit: Payer: Self-pay

## 2017-04-18 ENCOUNTER — Ambulatory Visit: Payer: Self-pay | Admitting: General Practice

## 2017-04-18 VITALS — BP 115/61 | HR 94 | Ht 59.0 in | Wt 185.0 lb

## 2017-04-18 DIAGNOSIS — O09219 Supervision of pregnancy with history of pre-term labor, unspecified trimester: Principal | ICD-10-CM

## 2017-04-18 DIAGNOSIS — O09899 Supervision of other high risk pregnancies, unspecified trimester: Secondary | ICD-10-CM

## 2017-04-18 NOTE — Progress Notes (Signed)
Shyanna Mas Esquivel here for 17-P  Injection.  Injection administered without complication. Patient will return in one week for next injection.  Marylynn PearsonCarrie Hillman, RN 04/18/2017  3:06 PM

## 2017-04-25 ENCOUNTER — Ambulatory Visit (INDEPENDENT_AMBULATORY_CARE_PROVIDER_SITE_OTHER): Payer: Self-pay | Admitting: *Deleted

## 2017-04-25 VITALS — BP 120/61 | HR 98

## 2017-04-25 DIAGNOSIS — O09212 Supervision of pregnancy with history of pre-term labor, second trimester: Secondary | ICD-10-CM

## 2017-04-25 DIAGNOSIS — L299 Pruritus, unspecified: Secondary | ICD-10-CM

## 2017-04-25 DIAGNOSIS — O24410 Gestational diabetes mellitus in pregnancy, diet controlled: Secondary | ICD-10-CM

## 2017-04-25 MED ORDER — HYDROCORTISONE 0.5 % EX OINT: 1.0000 "application " | TOPICAL_OINTMENT | Freq: Two times a day (BID) | CUTANEOUS | 1 refills | Status: DC

## 2017-04-25 NOTE — Progress Notes (Signed)
Here for 17p. Request to change next appt due to work- will do at check out. Explained 17p should be every 5-9 days, ideally every 7 days. C/o some irritation at site of 17p. Discussed some irritation normal but if has rash or trouble breathing, etc this is allergic reaction. Will continue 17p. Also states rx for itching on abdomen was not at pharmacy. Per note was printed- will eprescribe.

## 2017-04-25 NOTE — Patient Instructions (Signed)
Hydrocortisone Ointment 0.5% is what provider ordered for itching on your abdomen.

## 2017-05-02 ENCOUNTER — Ambulatory Visit: Payer: Self-pay

## 2017-05-04 ENCOUNTER — Ambulatory Visit: Payer: Self-pay

## 2017-05-04 ENCOUNTER — Ambulatory Visit (HOSPITAL_COMMUNITY)
Admission: RE | Admit: 2017-05-04 | Discharge: 2017-05-04 | Disposition: A | Discharge status: Home / Self Care | Payer: Self-pay | Source: Ambulatory Visit | Attending: Family Medicine | Admitting: Family Medicine

## 2017-05-04 ENCOUNTER — Encounter (HOSPITAL_COMMUNITY): Payer: Self-pay

## 2017-05-04 ENCOUNTER — Other Ambulatory Visit (HOSPITAL_COMMUNITY): Payer: Self-pay | Admitting: Maternal and Fetal Medicine

## 2017-05-04 DIAGNOSIS — O09219 Supervision of pregnancy with history of pre-term labor, unspecified trimester: Principal | ICD-10-CM

## 2017-05-04 DIAGNOSIS — O24415 Gestational diabetes mellitus in pregnancy, controlled by oral hypoglycemic drugs: Secondary | ICD-10-CM | POA: Insufficient documentation

## 2017-05-04 DIAGNOSIS — Z362 Encounter for other antenatal screening follow-up: Secondary | ICD-10-CM | POA: Insufficient documentation

## 2017-05-04 DIAGNOSIS — Z3A27 27 weeks gestation of pregnancy: Secondary | ICD-10-CM | POA: Insufficient documentation

## 2017-05-04 DIAGNOSIS — O0990 Supervision of high risk pregnancy, unspecified, unspecified trimester: Secondary | ICD-10-CM

## 2017-05-04 DIAGNOSIS — O09899 Supervision of other high risk pregnancies, unspecified trimester: Secondary | ICD-10-CM

## 2017-05-04 DIAGNOSIS — O09212 Supervision of pregnancy with history of pre-term labor, second trimester: Secondary | ICD-10-CM | POA: Insufficient documentation

## 2017-05-04 NOTE — Progress Notes (Signed)
Chart reviewed for nurse visit. Agree with plan of care.   Marylene LandKooistra, Annamary Buschman Lorraine, CNM 05/04/2017 4:06 PM

## 2017-05-04 NOTE — Progress Notes (Signed)
Pt came for 17-p injection at 1534 today 05/04/17.

## 2017-05-07 ENCOUNTER — Other Ambulatory Visit (HOSPITAL_COMMUNITY): Payer: Self-pay | Admitting: *Deleted

## 2017-05-07 DIAGNOSIS — O24119 Pre-existing diabetes mellitus, type 2, in pregnancy, unspecified trimester: Secondary | ICD-10-CM

## 2017-05-10 ENCOUNTER — Ambulatory Visit (INDEPENDENT_AMBULATORY_CARE_PROVIDER_SITE_OTHER): Payer: Self-pay | Admitting: Family Medicine

## 2017-05-10 VITALS — BP 100/57 | HR 96 | Wt 187.1 lb

## 2017-05-10 DIAGNOSIS — O0993 Supervision of high risk pregnancy, unspecified, third trimester: Secondary | ICD-10-CM

## 2017-05-10 DIAGNOSIS — O280 Abnormal hematological finding on antenatal screening of mother: Secondary | ICD-10-CM

## 2017-05-10 DIAGNOSIS — O09219 Supervision of pregnancy with history of pre-term labor, unspecified trimester: Secondary | ICD-10-CM

## 2017-05-10 DIAGNOSIS — O0990 Supervision of high risk pregnancy, unspecified, unspecified trimester: Secondary | ICD-10-CM

## 2017-05-10 DIAGNOSIS — O09213 Supervision of pregnancy with history of pre-term labor, third trimester: Secondary | ICD-10-CM

## 2017-05-10 DIAGNOSIS — O24410 Gestational diabetes mellitus in pregnancy, diet controlled: Secondary | ICD-10-CM

## 2017-05-10 DIAGNOSIS — O09899 Supervision of other high risk pregnancies, unspecified trimester: Secondary | ICD-10-CM

## 2017-05-10 MED ORDER — GLYBURIDE 2.5 MG PO TABS
3.7500 mg | ORAL_TABLET | Freq: Every day | ORAL | 3 refills | Status: DC
Start: 2017-05-10 — End: 2017-05-31

## 2017-05-10 NOTE — Progress Notes (Signed)
US for growth scheduled 4/21

## 2017-05-10 NOTE — Progress Notes (Signed)
    PRENATAL VISIT NOTE Video Spanish interpreter: [name] # [phone] used Subjective:  Julie Brooks is a [age] y.o. [id number] at 3461w3d being seen today for ongoing prenatal care.  She is currently monitored for the following issues for this high-risk pregnancy and has Supervision of high risk pregnancy, antepartum; History of preterm delivery, currently pregnant; Gestational diabetes; and Language barrier on their problem list.  Patient reports no complaints.  Contractions: Not present. Vag. Bleeding: None.  Movement: Present. Denies leaking of fluid.   The following portions of the patient's history were reviewed and updated as appropriate: allergies, current medications, past family history, past medical history, past social history, past surgical history and problem list. Problem list updated.  Objective:   Vitals:   05/10/17 0925  BP: (!) 100/57  Pulse: 96  Weight: 187 lb 1.6 oz (84.9 kg)    Fetal Status: Fetal Heart Rate (bpm): 141 Fundal Height: 30 cm Movement: Present     General:  Alert, oriented and cooperative. Patient is in no acute distress.  Skin: Skin is warm and dry. No rash noted.   Cardiovascular: Normal heart rate noted  Respiratory: Normal respiratory effort, no problems with respiration noted  Abdomen: Soft, gravid, appropriate for gestational age.  Pain/Pressure: Present     Pelvic: Cervical exam deferred        Extremities: Normal range of motion.  Edema: None  Mental Status:  Normal mood and affect. Normal behavior. Normal judgment and thought content.  U/s trv, nml fluid, EFW 1390 gm 3 lb 1 oz (81%) nml anatomy CBG 78-112 7 of 12 out of range 2 hr 75-140 7 of 37 are out of range Assessment and Plan:  Pregnancy: [id number] at 34061w3d  1. Supervision of high risk pregnancy, antepartum 28 wk labs today - CBC - RPR - HIV antibody - Tdap vaccine greater than or equal to 34yo IM  2. History of preterm delivery, currently pregnant Continue 17 P weekly  3.  Diet controlled gestational diabetes mellitus (GDM) in second trimester Increased glyburide q hs to 3.75 mg due to increasing FBS - glyBURIDE (DIABETA) 2.5 MG tablet; Take 1.5 tablets (3.75 mg total) by mouth at bedtime.  Dispense: 60 tablet; Refill: 3   Preterm labor symptoms and general obstetric precautions including but not limited to vaginal bleeding, contractions, leaking of fluid and fetal movement were reviewed in detail with the patient. Return in 2 weeks (on 05/24/2017) for 17 P weekly.  Please refer to After Visit Summary for other counseling recommendations.    Reva Boresanya S Hanson Medeiros, MD

## 2017-05-10 NOTE — Patient Instructions (Signed)
 Lactancia materna Breastfeeding Decidir amamantar es una de las mejores elecciones que puede hacer por usted y su beb. Un cambio en las hormonas durante el embarazo hace que las mamas produzcan leche materna en las glndulas productoras de leche. Las hormonas impiden que la leche materna sea liberada antes del nacimiento del beb. Adems, impulsan el flujo de leche luego del nacimiento. Una vez que ha comenzado a amamantar, pensar en el beb, as como la succin o el llanto, pueden estimular la liberacin de leche de las glndulas productoras de leche. Los beneficios de amamantar Las investigaciones demuestran que la lactancia materna ofrece muchos beneficios de salud para bebs y madres. Adems, ofrece una forma gratuita y conveniente de alimentar al beb. Para el beb  La primera leche (calostro) ayuda a mejorar el funcionamiento del aparato digestivo del beb.  Las clulas especiales de la leche (anticuerpos) ayudan a combatir las infecciones en el beb.  Los bebs que se alimentan con leche materna tambin tienen menos probabilidades de tener asma, alergias, obesidad o diabetes de tipo 2. Adems, tienen menor riesgo de sufrir el sndrome de muerte sbita del lactante (SMSL).  Los nutrientes de la leche materna son mejores para satisfacer las necesidades del beb en comparacin con la leche maternizada.  La leche materna mejora el desarrollo cerebral del beb. Para usted  La lactancia materna favorece el desarrollo de un vnculo muy especial entre la madre y el beb.  Es conveniente. La leche materna es econmica y siempre est disponible a la temperatura correcta.  La lactancia materna ayuda a quemar caloras. Le ayuda a perder el peso ganado durante el embarazo.  Hace que el tero vuelva al tamao que tena antes del embarazo ms rpido. Adems, disminuye el sangrado (loquios) despus del parto.  La lactancia materna contribuye a reducir el riesgo de tener diabetes de tipo 2,  osteoporosis, artritis reumatoide, enfermedades cardiovasculares y cncer de mama, ovario, tero y endometrio en el futuro. Informacin bsica sobre la lactancia Comienzo de la lactancia  Encuentre un lugar cmodo para sentarse o acostarse, con un buen respaldo para el cuello y la espalda.  Coloque una almohada o una manta enrollada debajo del beb para acomodarlo a la altura de la mama (si est sentada). Las almohadas para amamantar se han diseado especialmente a fin de servir de apoyo para los brazos y el beb mientras amamanta.  Asegrese de que la barriga del beb (abdomen) est frente a la suya.  Masajee suavemente la mama. Con las yemas de los dedos, masajee los bordes exteriores de la mama hacia adentro, en direccin al pezn. Esto estimula el flujo de leche. Si la leche fluye lentamente, es posible que deba continuar con este movimiento durante la lactancia.  Sostenga la mama con 4 dedos por debajo y el pulgar por arriba del pezn (forme la letra "C" con la mano). Asegrese de que los dedos se encuentren lejos del pezn y de la boca del beb.  Empuje suavemente los labios del beb con el pezn o con el dedo.  Cuando la boca del beb se abra lo suficiente, acrquelo rpidamente a la mama e introduzca todo el pezn y la arola, tanto como sea posible, dentro de la boca del beb. La arola es la zona de color que rodea al pezn. ? Debe haber ms arola visible por arriba del labio superior del beb que por debajo del labio inferior. ? Los labios del beb deben estar abiertos y extendidos hacia afuera (evertidos) para asegurar que   el beb se prenda de forma adecuada y cmoda. ? La lengua del beb debe estar entre la enca inferior y la mama.  Asegrese de que la boca del beb est en la posicin correcta alrededor del pezn (prendido). Los labios del beb deben crear un sello sobre la mama y estar doblados hacia afuera (invertidos).  Es comn que el beb succione durante 2 a 3 minutos  para que comience el flujo de leche materna. Cmo debe prenderse Es muy importante que le ensee al beb cmo prenderse adecuadamente a la mama. Si el beb no se prende adecuadamente, puede causar dolor en los pezones, reducir la produccin de leche materna y hacer que el beb tenga un escaso aumento de peso. Adems, si el beb no se prende adecuadamente al pezn, puede tragar aire durante la alimentacin. Esto puede causarle molestias al beb. Hacer eructar al beb al cambiar de mama puede ayudarlo a liberar el aire. Sin embargo, ensearle al beb cmo prenderse a la mama adecuadamente es la mejor manera de evitar que se sienta molesto por tragar aire mientras se alimenta. Signos de que el beb se ha prendido adecuadamente al pezn  Tironea o succiona de modo silencioso, sin causarle dolor. Los labios del beb deben estar extendidos hacia afuera (evertidos).  Se escucha que traga cada 3 o 4 succiones una vez que la leche ha comenzado a fluir (despus de que se produzca el reflejo de eyeccin de la leche).  Hay movimientos musculares por arriba y por delante de sus odos al succionar.  Signos de que el beb no se ha prendido adecuadamente al pezn  Hace ruidos de succin o de chasquido mientras se alimenta.  Siente dolor en los pezones.  Si cree que el beb no se prendi correctamente, deslice el dedo en la comisura de la boca y colquelo entre las encas del beb para interrumpir la succin. Intente volver a comenzar a amamantar. Signos de lactancia materna exitosa Signos del beb  El beb disminuir gradualmente el nmero de succiones o dejar de succionar por completo.  El beb se quedar dormido.  El cuerpo del beb se relajar.  El beb retendr una pequea cantidad de leche en la boca.  El beb se desprender solo del pecho.  Signos que presenta usted  Las mamas han aumentado la firmeza, el peso y el tamao 1 a 3 horas despus de amamantar.  Estn ms blandas inmediatamente  despus de amamantar.  Se producen un aumento del volumen de leche y un cambio en su consistencia y color hacia el quinto da de lactancia.  Los pezones no duelen, no estn agrietados ni sangran.  Signos de que su beb recibe la cantidad de leche suficiente  Mojar por lo menos 1 o 2paales durante las primeras 24horas despus del nacimiento.  Mojar por lo menos 5 o 6paales cada 24horas durante la primera semana despus del nacimiento. La orina debe ser clara o de color amarillo plido a los 5das de vida.  Mojar entre 6 y 8paales cada 24horas a medida que el beb sigue creciendo y desarrollndose.  Defeca por lo menos 3 veces en 24 horas a los 5 das de vida. Las heces deben ser blandas y amarillentas.  Defeca por lo menos 3 veces en 24 horas a los 7 das de vida. Las heces deben ser grumosas y amarillentas.  No registra una prdida de peso mayor al 10% del peso al nacer durante los primeros 3 das de vida.  Aumenta de peso un   promedio de 4 a 7onzas (113 a 198g) por semana despus de los 4 das de vida.  Aumenta de peso, diariamente, de manera uniforme a partir de los 5 das de vida, sin registrar prdida de peso despus de las 2semanas de vida. Despus de alimentarse, es posible que el beb regurgite una pequea cantidad de leche. Esto es normal. Frecuencia y duracin de la lactancia El amamantamiento frecuente la ayudar a producir ms leche y puede prevenir dolores en los pezones y las mamas extremadamente llenas (congestin mamaria). Alimente al beb cuando muestre signos de hambre o si siente la necesidad de reducir la congestin de las mamas. Esto se denomina "lactancia a demanda". Las seales de que el beb tiene hambre incluyen las siguientes:  Aumento del estado de alerta, actividad o inquietud.  Mueve la cabeza de un lado a otro.  Abre la boca cuando se le toca la mejilla o la comisura de la boca (reflejo de bsqueda).  Aumenta las vocalizaciones, tales como  sonidos de succin, se relame los labios, emite arrullos, suspiros o chirridos.  Mueve la mano hacia la boca y se chupa los dedos o las manos.  Est molesto o llora.  Evite el uso del chupete en las primeras 4 a 6 semanas despus del nacimiento del beb. Despus de este perodo, podr usar un chupete. Las investigaciones demostraron que el uso del chupete durante el primer ao de vida del beb disminuye el riesgo de tener el sndrome de muerte sbita del lactante (SMSL). Permita que el nio se alimente en cada mama todo lo que desee. Cuando el beb se desprende o se queda dormido mientras se est alimentando de la primera mama, ofrzcale la segunda. Debido a que, con frecuencia, los recin nacidos estn somnolientos las primeras semanas de vida, es posible que deba despertar al beb para alimentarlo. Los horarios de lactancia varan de un beb a otro. Sin embargo, las siguientes reglas pueden servir como gua para ayudarla a garantizar que el beb se alimenta adecuadamente:  Se puede amamantar a los recin nacidos (bebs de 4 semanas o menos de vida) cada 1 a 3 horas.  No deben transcurrir ms de 3 horas durante el da o 5 horas durante la noche sin que se amamante a los recin nacidos.  Debe amamantar al beb un mnimo de 8 veces en un perodo de 24 horas.  Extraccin de leche materna La extraccin y el almacenamiento de la leche materna le permiten asegurarse de que el beb se alimente exclusivamente de su leche materna, aun en momentos en los que no puede amamantar. Esto tiene especial importancia si debe regresar al trabajo en el perodo en que an est amamantando o si no puede estar presente en los momentos en que el beb debe alimentarse. Su asesor en lactancia puede ayudarla a encontrar un mtodo de extraccin que funcione mejor para usted y orientarla sobre cunto tiempo es seguro almacenar leche materna. Cmo cuidar las mamas durante la lactancia Los pezones pueden secarse, agrietarse y  doler durante la lactancia. Las siguientes recomendaciones pueden ayudarla a mantener las mamas humectadas y sanas:  Evite usar jabn en los pezones.  Use un sostn de soporte diseado especialmente para la lactancia materna. Evite usar sostenes con aro o sostenes muy ajustados (sostenes deportivos).  Seque al aire sus pezones durante 3 a 4minutos despus de amamantar al beb.  Utilice solo apsitos de algodn en el sostn para absorber las prdidas de leche. La prdida de un poco de leche materna   entre las tomas es normal.  Utilice lanolina sobre los pezones luego de amamantar. La lanolina ayuda a mantener la humedad normal de la piel. La lanolina pura no es perjudicial (no es txica) para el beb. Adems, puede extraer manualmente algunas gotas de leche materna y masajear suavemente esa leche sobre los pezones para que la leche se seque al aire.  Durante las primeras semanas despus del nacimiento, algunas mujeres experimentan congestin mamaria. La congestin mamaria puede hacer que sienta las mamas pesadas, calientes y sensibles al tacto. El pico de la congestin mamaria ocurre en el plazo de los 3 a 5 das despus del parto. Las siguientes recomendaciones pueden ayudarla a aliviar la congestin mamaria:  Vace por completo las mamas al amamantar o extraer leche. Puede aplicar calor hmedo en las mamas (en la ducha o con toallas hmedas para manos) antes de amamantar o extraer leche. Esto aumenta la circulacin y ayuda a que la leche fluya. Si el beb no vaca por completo las mamas cuando lo amamanta, extraiga la leche restante despus de que haya finalizado.  Aplique compresas de hielo sobre las mamas inmediatamente despus de amamantar o extraer leche, a menos que le resulte demasiado incmodo. Haga lo siguiente: ? Ponga el hielo en una bolsa plstica. ? Coloque una toalla entre la piel y la bolsa de hielo. ? Coloque el hielo durante 20minutos, 2 o 3veces por da.  Asegrese de que el  beb est prendido y se encuentre en la posicin correcta mientras lo alimenta.  Si la congestin mamaria persiste luego de 48 horas o despus de seguir estas recomendaciones, comunquese con su mdico o un asesor en lactancia. Recomendaciones de salud general durante la lactancia  Consuma 3 comidas y 3 colaciones saludables todos los das. Las madres bien alimentadas que amamantan necesitan entre 450 y 500 caloras adicionales por da. Puede cumplir con este requisito al aumentar la cantidad de una dieta equilibrada que realice.  Beba suficiente agua para mantener la orina clara o de color amarillo plido.  Descanse con frecuencia, reljese y siga tomando sus vitaminas prenatales para prevenir la fatiga, el estrs y los niveles bajos de vitaminas y minerales en el cuerpo (deficiencias de nutrientes).  No consuma ningn producto que contenga nicotina o tabaco, como cigarrillos y cigarrillos electrnicos. El beb puede verse afectado por las sustancias qumicas de los cigarrillos que pasan a la leche materna y por la exposicin al humo ambiental del tabaco. Si necesita ayuda para dejar de fumar, consulte al mdico.  Evite el consumo de alcohol.  No consuma drogas ilegales o marihuana.  Antes de usar cualquier medicamento, hable con el mdico. Estos incluyen medicamentos recetados y de venta libre, como tambin vitaminas y suplementos a base de hierbas. Algunos medicamentos, que pueden ser perjudiciales para el beb, pueden pasar a travs de la leche materna.  Puede quedar embarazada durante la lactancia. Si se desea un mtodo anticonceptivo, consulte al mdico sobre cules son las opciones seguras durante la lactancia. Dnde encontrar ms informacin: Liga internacional La Leche: www.llli.org. Comunquese con un mdico si:  Siente que quiere dejar de amamantar o se siente frustrada con la lactancia.  Sus pezones estn agrietados o sangran.  Sus mamas estn irritadas, sensibles o  calientes.  Tiene los siguientes sntomas: ? Dolor en las mamas o en los pezones. ? Un rea hinchada en cualquiera de las mamas. ? Fiebre o escalofros. ? Nuseas o vmitos. ? Drenaje de otro lquido distinto de la leche materna desde los pezones.    Sus mamas no se llenan antes de amamantar al beb para el quinto da despus del parto.  Se siente triste y deprimida.  El beb: ? Est demasiado somnoliento como para comer bien. ? Tiene problemas para dormir. ? Tiene ms de 1 semana de vida y moja menos de 6 paales en un periodo de 24 horas. ? No ha aumentado de peso a los 5 das de vida.  El beb defeca menos de 3 veces en 24 horas.  La piel del beb o las partes blancas de los ojos se vuelven amarillentas. Solicite ayuda de inmediato si:  El beb est muy cansado (letargo) y no se quiere despertar para comer.  Le sube la fiebre sin causa. Resumen  La lactancia materna ofrece muchos beneficios de salud para bebs y madres.  Intente amamantar a su beb cuando muestre signos tempranos de hambre.  Haga cosquillas o empuje suavemente los labios del beb con el dedo o el pezn para lograr que el beb abra la boca. Acerque el beb a la mama. Asegrese de que la mayor parte de la arola se encuentre dentro de la boca del beb. Ofrzcale una mama y haga eructar al beb antes de pasar a la otra.  Hable con su mdico o asesor en lactancia si tiene dudas o problemas con la lactancia. Esta informacin no tiene como fin reemplazar el consejo del mdico. Asegrese de hacerle al mdico cualquier pregunta que tenga. Document Released: 02/06/2005 Document Revised: 05/29/2016 Document Reviewed: 05/29/2016 Elsevier Interactive Patient Education  2018 Elsevier Inc.  

## 2017-05-11 LAB — CBC
HEMATOCRIT: 34.2 % (ref 34.0–46.6)
HEMOGLOBIN: 11.4 g/dL (ref 11.1–15.9)
MCH: 27.7 pg (ref 26.6–33.0)
MCHC: 33.3 g/dL (ref 31.5–35.7)
MCV: 83 fL (ref 79–97)
PLATELETS: 267 10*3/uL (ref 150–379)
RBC: 4.12 x10E6/uL (ref 3.77–5.28)
RDW: 14.6 % (ref 12.3–15.4)
WBC: 13.2 10*3/uL — AB (ref 3.4–10.8)

## 2017-05-11 LAB — RPR: RPR Ser Ql: NONREACTIVE

## 2017-05-11 LAB — HIV ANTIBODY (ROUTINE TESTING W REFLEX): HIV Screen 4th Generation wRfx: NONREACTIVE

## 2017-05-17 ENCOUNTER — Ambulatory Visit (INDEPENDENT_AMBULATORY_CARE_PROVIDER_SITE_OTHER): Payer: Self-pay | Admitting: Obstetrics and Gynecology

## 2017-05-17 ENCOUNTER — Ambulatory Visit: Payer: Self-pay

## 2017-05-17 VITALS — BP 118/76 | HR 97 | Ht 61.02 in | Wt 188.0 lb

## 2017-05-17 VITALS — BP 118/76 | HR 97 | Wt 188.0 lb

## 2017-05-17 DIAGNOSIS — O24410 Gestational diabetes mellitus in pregnancy, diet controlled: Secondary | ICD-10-CM

## 2017-05-17 DIAGNOSIS — O0990 Supervision of high risk pregnancy, unspecified, unspecified trimester: Secondary | ICD-10-CM

## 2017-05-17 DIAGNOSIS — O09899 Supervision of other high risk pregnancies, unspecified trimester: Secondary | ICD-10-CM

## 2017-05-17 DIAGNOSIS — O09219 Supervision of pregnancy with history of pre-term labor, unspecified trimester: Secondary | ICD-10-CM

## 2017-05-17 DIAGNOSIS — O09213 Supervision of pregnancy with history of pre-term labor, third trimester: Secondary | ICD-10-CM

## 2017-05-17 DIAGNOSIS — Z789 Other specified health status: Secondary | ICD-10-CM

## 2017-05-17 NOTE — Progress Notes (Signed)
Pt was seen as W/I d/t increased vaginal pressure over the last 24 hrs. No VB, LOF or recent IC Pt with H/O PTD and is receiving 17 OHP CL 3.55 cm on 05/04/17 [facility] LTC  Pt reassured PTL precautions reviewed with pt. Continue with current medications and keep weekly appts for 17 OHP and routine OB appts.

## 2017-05-17 NOTE — Progress Notes (Signed)
Julie Brooks here for 17-P  Injection.  Injection administered without complication. Patient will return in one week for next injection.  Marylynn PearsonCarrie Praneel Haisley, RN 05/17/2017  3:22 PM   Patient reports increased vaginal pressure and contractions similar to last preterm delivery. Will have patient see Dr Alysia PennaErvin for exam.

## 2017-05-24 ENCOUNTER — Ambulatory Visit: Payer: Self-pay

## 2017-05-24 ENCOUNTER — Ambulatory Visit (INDEPENDENT_AMBULATORY_CARE_PROVIDER_SITE_OTHER): Payer: Self-pay

## 2017-05-24 DIAGNOSIS — O09219 Supervision of pregnancy with history of pre-term labor, unspecified trimester: Secondary | ICD-10-CM

## 2017-05-24 DIAGNOSIS — O09899 Supervision of other high risk pregnancies, unspecified trimester: Secondary | ICD-10-CM

## 2017-05-24 DIAGNOSIS — O09213 Supervision of pregnancy with history of pre-term labor, third trimester: Secondary | ICD-10-CM

## 2017-05-24 NOTE — Progress Notes (Signed)
Interpreter Julie Brooks. Julie Brooks here for 17-P  Injection.  Injection administered without complication. Patient will return in one week for next injection.  Julie Brooks l Julie Brooks, CMA 05/24/2017  2:28 PM

## 2017-05-25 NOTE — Progress Notes (Signed)
I have reviewed the chart and agree with nursing staff's documentation of this patient's encounter.  Kahli Mayon, MD 05/25/2017 2:20 PM    

## 2017-05-31 ENCOUNTER — Ambulatory Visit (INDEPENDENT_AMBULATORY_CARE_PROVIDER_SITE_OTHER): Payer: Self-pay | Admitting: Obstetrics and Gynecology

## 2017-05-31 VITALS — BP 108/49 | HR 105 | Wt 189.8 lb

## 2017-05-31 DIAGNOSIS — O24410 Gestational diabetes mellitus in pregnancy, diet controlled: Secondary | ICD-10-CM

## 2017-05-31 DIAGNOSIS — O09219 Supervision of pregnancy with history of pre-term labor, unspecified trimester: Secondary | ICD-10-CM

## 2017-05-31 DIAGNOSIS — Z789 Other specified health status: Secondary | ICD-10-CM

## 2017-05-31 DIAGNOSIS — O09899 Supervision of other high risk pregnancies, unspecified trimester: Secondary | ICD-10-CM

## 2017-05-31 DIAGNOSIS — O0993 Supervision of high risk pregnancy, unspecified, third trimester: Secondary | ICD-10-CM

## 2017-05-31 DIAGNOSIS — O09213 Supervision of pregnancy with history of pre-term labor, third trimester: Secondary | ICD-10-CM

## 2017-05-31 DIAGNOSIS — O0990 Supervision of high risk pregnancy, unspecified, unspecified trimester: Secondary | ICD-10-CM

## 2017-05-31 DIAGNOSIS — O24415 Gestational diabetes mellitus in pregnancy, controlled by oral hypoglycemic drugs: Secondary | ICD-10-CM

## 2017-05-31 MED ORDER — GLYBURIDE 2.5 MG PO TABS: 3.7500 mg | ORAL_TABLET | Freq: Every day | ORAL | 3 refills | Status: DC

## 2017-05-31 NOTE — Progress Notes (Signed)
Prenatal Visit Note Date: 05/31/2017 [facility]  Subjective:  Julie CatalanGlendy Mas Brooks is a 32 y.o. [phone] at 7471w3d being seen today for ongoing prenatal care.  She is currently monitored for the following issues for this high-risk pregnancy and has Supervision of high risk pregnancy, antepartum; History of preterm delivery, currently pregnant; Gestational diabetes; and Language barrier on their problem list.  Patient reports low belly and back discomfort. .   Contractions: Irritability. Vag. Bleeding: None.  Movement: Present. Denies leaking of fluid.   The following portions of the patient's history were reviewed and updated as appropriate: allergies, current medications, past family history, past medical history, past social history, past surgical history and problem list. Problem list updated.  Objective:   Vitals:   05/31/17 1405  BP: (!) 108/49  Pulse: (!) 105  Weight: 189 lb 12.8 oz (86.1 kg)    Fetal Status: Fetal Heart Rate (bpm): 155   Movement: Present     General:  Alert, oriented and cooperative. Patient is in no acute distress.  Skin: Skin is warm and dry. No rash noted.   Cardiovascular: Normal heart rate noted  Respiratory: Normal respiratory effort, no problems with respiration noted  Abdomen: Soft, gravid, appropriate for gestational age. Pain/Pressure: Present     Pelvic:  Cervical exam deferred        Extremities: Normal range of motion.  Edema: None  Mental Status: Normal mood and affect. Normal behavior. Normal judgment and thought content.   Urinalysis:      Assessment and Plan:  Pregnancy: [id number] at 3271w3d  1. Supervision of high risk pregnancy, antepartum Routine care. Ask about BC nv  2. Gestational diabetes mellitus (GDM) controlled on oral hypoglycemic drug, antepartum Doing well on glybuide 3.75 qhs. Will start ap testing next week. 3/15 u/s normal efw/afi, ac >97%.   3. History of preterm delivery, currently  pregnant 17p today. Continue qwk  4. Language barrier Interpreter used.   5. Diet controlled gestational diabetes mellitus (GDM) in second trimester - glyBURIDE (DIABETA) 2.5 MG tablet; Take 1.5 tablets (3.75 mg total) by mouth at bedtime.  Dispense: 60 tablet; Refill: 3  Preterm labor symptoms and general obstetric precautions including but not limited to vaginal bleeding, contractions, leaking of fluid and fetal movement were reviewed in detail with the patient. Please refer to After Visit Summary for other counseling recommendations.  Return in about 1 week (around 06/07/2017) for 1wk 17p, nst/bpp. 2wk 17p nst/bpp/hrob.   [place] BingPickens, Antavius Sperbeck, MD

## 2017-05-31 NOTE — Progress Notes (Signed)
Spanish interpreter Okey RegalCarol present for visit

## 2017-06-01 ENCOUNTER — Encounter (HOSPITAL_COMMUNITY): Payer: Self-pay

## 2017-06-01 ENCOUNTER — Ambulatory Visit (HOSPITAL_COMMUNITY)
Admission: RE | Admit: 2017-06-01 | Discharge: 2017-06-01 | Disposition: A | Discharge status: Home / Self Care | Payer: Self-pay | Source: Ambulatory Visit | Attending: Family Medicine | Admitting: Family Medicine

## 2017-06-01 ENCOUNTER — Ambulatory Visit (HOSPITAL_COMMUNITY): Payer: Self-pay

## 2017-06-01 DIAGNOSIS — O09219 Supervision of pregnancy with history of pre-term labor, unspecified trimester: Secondary | ICD-10-CM

## 2017-06-01 DIAGNOSIS — O0990 Supervision of high risk pregnancy, unspecified, unspecified trimester: Secondary | ICD-10-CM

## 2017-06-01 DIAGNOSIS — O24119 Pre-existing diabetes mellitus, type 2, in pregnancy, unspecified trimester: Secondary | ICD-10-CM

## 2017-06-01 DIAGNOSIS — Z3A31 31 weeks gestation of pregnancy: Secondary | ICD-10-CM | POA: Insufficient documentation

## 2017-06-01 DIAGNOSIS — O24415 Gestational diabetes mellitus in pregnancy, controlled by oral hypoglycemic drugs: Secondary | ICD-10-CM | POA: Insufficient documentation

## 2017-06-01 DIAGNOSIS — O99213 Obesity complicating pregnancy, third trimester: Secondary | ICD-10-CM | POA: Insufficient documentation

## 2017-06-01 DIAGNOSIS — O09899 Supervision of other high risk pregnancies, unspecified trimester: Secondary | ICD-10-CM

## 2017-06-01 DIAGNOSIS — O09213 Supervision of pregnancy with history of pre-term labor, third trimester: Secondary | ICD-10-CM | POA: Insufficient documentation

## 2017-06-04 ENCOUNTER — Other Ambulatory Visit: Payer: Self-pay | Admitting: *Deleted

## 2017-06-04 DIAGNOSIS — O0990 Supervision of high risk pregnancy, unspecified, unspecified trimester: Secondary | ICD-10-CM

## 2017-06-04 MED ORDER — PRENATAL VITAMIN 27-0.8 MG PO TABS: 1.0000 | ORAL_TABLET | Freq: Every day | ORAL | 6 refills | Status: DC

## 2017-06-07 ENCOUNTER — Ambulatory Visit (INDEPENDENT_AMBULATORY_CARE_PROVIDER_SITE_OTHER): Payer: Self-pay | Admitting: *Deleted

## 2017-06-07 ENCOUNTER — Ambulatory Visit: Payer: Self-pay

## 2017-06-07 VITALS — BP 113/66 | HR 86

## 2017-06-07 DIAGNOSIS — O24415 Gestational diabetes mellitus in pregnancy, controlled by oral hypoglycemic drugs: Secondary | ICD-10-CM

## 2017-06-07 NOTE — Progress Notes (Signed)

## 2017-06-13 ENCOUNTER — Ambulatory Visit (INDEPENDENT_AMBULATORY_CARE_PROVIDER_SITE_OTHER): Payer: Self-pay | Admitting: Obstetrics & Gynecology

## 2017-06-13 ENCOUNTER — Ambulatory Visit (INDEPENDENT_AMBULATORY_CARE_PROVIDER_SITE_OTHER): Payer: Self-pay | Admitting: *Deleted

## 2017-06-13 ENCOUNTER — Inpatient Hospital Stay (HOSPITAL_COMMUNITY)
Admission: AD | Admit: 2017-06-13 | Discharge: 2017-06-15 | DRG: 833 | Disposition: A | Discharge status: Home / Self Care | Payer: Self-pay | Source: Ambulatory Visit | Attending: Obstetrics & Gynecology | Admitting: Obstetrics & Gynecology

## 2017-06-13 ENCOUNTER — Encounter (HOSPITAL_COMMUNITY): Payer: Self-pay | Admitting: *Deleted

## 2017-06-13 ENCOUNTER — Ambulatory Visit: Payer: Self-pay

## 2017-06-13 VITALS — BP 102/61 | HR 92 | Wt 189.0 lb

## 2017-06-13 DIAGNOSIS — Z8632 Personal history of gestational diabetes: Secondary | ICD-10-CM | POA: Diagnosis present

## 2017-06-13 DIAGNOSIS — O09899 Supervision of other high risk pregnancies, unspecified trimester: Secondary | ICD-10-CM

## 2017-06-13 DIAGNOSIS — O24415 Gestational diabetes mellitus in pregnancy, controlled by oral hypoglycemic drugs: Secondary | ICD-10-CM

## 2017-06-13 DIAGNOSIS — O09213 Supervision of pregnancy with history of pre-term labor, third trimester: Secondary | ICD-10-CM

## 2017-06-13 DIAGNOSIS — Z8751 Personal history of pre-term labor: Secondary | ICD-10-CM

## 2017-06-13 DIAGNOSIS — Z789 Other specified health status: Secondary | ICD-10-CM | POA: Diagnosis present

## 2017-06-13 DIAGNOSIS — Z3A33 33 weeks gestation of pregnancy: Secondary | ICD-10-CM

## 2017-06-13 DIAGNOSIS — O0990 Supervision of high risk pregnancy, unspecified, unspecified trimester: Secondary | ICD-10-CM

## 2017-06-13 DIAGNOSIS — O09219 Supervision of pregnancy with history of pre-term labor, unspecified trimester: Secondary | ICD-10-CM

## 2017-06-13 DIAGNOSIS — Z603 Acculturation difficulty: Secondary | ICD-10-CM

## 2017-06-13 MED ORDER — BETAMETHASONE SOD PHOS & ACET 6 (3-3) MG/ML IJ SUSP
12.0000 mg | Freq: Once | INTRAMUSCULAR | Status: AC
  Administered 2017-06-13: 12 mg via INTRAMUSCULAR

## 2017-06-13 MED ORDER — NIFEDIPINE 10 MG PO CAPS
10.0000 mg | ORAL_CAPSULE | ORAL | Status: DC | PRN
  Administered 2017-06-13 – 2017-06-14 (×4): 10 mg via ORAL
  Filled 2017-06-13 (×4): qty 1

## 2017-06-13 MED ORDER — BETAMETHASONE SOD PHOS & ACET 6 (3-3) MG/ML IJ SUSP
12.0000 mg | Freq: Once | INTRAMUSCULAR | Status: DC
Start: 2017-06-14 — End: 2017-06-14
  Filled 2017-06-13: qty 2

## 2017-06-13 MED ORDER — HYDROXYPROGESTERONE CAPROATE 275 MG/1.1ML ~~LOC~~ SOAJ
275.0000 mg | Freq: Once | SUBCUTANEOUS | Status: AC
  Administered 2017-06-13: 275 mg via SUBCUTANEOUS

## 2017-06-13 NOTE — Progress Notes (Signed)
   PRENATAL VISIT NOTE  Subjective:  Julie Brooks is a 32 y.o. [phone] at 7560w2d being seen today for ongoing prenatal care.  She is currently monitored for the following issues for this high-risk pregnancy and has Supervision of high risk pregnancy, antepartum; History of preterm delivery, currently pregnant; Gestational diabetes; and Language barrier on their problem list.  Patient reports contractions for days.  Contractions: Irregular. Vag. Bleeding: None.  Movement: Present. Denies leaking of fluid.   The following portions of the patient's history were reviewed and updated as appropriate: allergies, current medications, past family history, past medical history, past social history, past surgical history and problem list. Problem list updated.  Objective:   Vitals:   06/13/17 1458  BP: 102/61  Pulse: 92  Weight: 189 lb (85.7 kg)    Fetal Status: Fetal Heart Rate (bpm): NST   Movement: Present     General:  Alert, oriented and cooperative. Patient is in no acute distress.  Skin: Skin is warm and dry. No rash noted.   Cardiovascular: Normal heart rate noted  Respiratory: Normal respiratory effort, no problems with respiration noted  Abdomen: Soft, gravid, appropriate for gestational age.  Pain/Pressure: Present     Pelvic: Cervical exam performed        Extremities: Normal range of motion.  Edema: None  Mental Status: Normal mood and affect. Normal behavior. Normal judgment and thought content.   Assessment and Plan:  Pregnancy: [id number] at 7760w2d  1. Supervision of high risk pregnancy, antepartum - MFM u/s on 07-04-17  2. Language barrier - video interpretor used for visit  3. History of preterm delivery, currently pregnant - BMZ today and tomorrow - HYDROXYprogesterone Caproate SOAJ 275 mg - recommend bedrest as much as possible - PTL precautions  4. Gestational diabetes mellitus (GDM) controlled on oral hypoglycemic drug, antepartum - fantastic  sugars  Preterm labor symptoms and general obstetric precautions including but not limited to vaginal bleeding, contractions, leaking of fluid and fetal movement were reviewed in detail with the patient. Please refer to After Visit Summary for other counseling recommendations.  Return in about 1 day (around 06/14/2017) for weekly provider visit and BPP with NST  and  tomorrow at 11:30 am for repeat betamethasone.  Future Appointments  Date Time Provider Department Center  06/29/2017  3:00 PM [facility] US 3 [facility]    Allie BossierMyra C Carnie Bruemmer, MD

## 2017-06-13 NOTE — Progress Notes (Signed)
Interpreter Jamesetta OrleansEricka present for encounter.  Pt reports frequent UC's last night. She ahs difficulty sleeping @ night.

## 2017-06-13 NOTE — MAU Note (Signed)
Pt states she has been contracting all day, was seen in the clinic and was 1cm dilated but it has gotten worse since.  Denies LOF/VB.

## 2017-06-13 NOTE — Progress Notes (Signed)

## 2017-06-14 ENCOUNTER — Ambulatory Visit: Payer: Self-pay

## 2017-06-14 ENCOUNTER — Other Ambulatory Visit: Payer: Self-pay

## 2017-06-14 LAB — TYPE AND SCREEN
ABO/RH(D): O POS
ANTIBODY SCREEN: NEGATIVE

## 2017-06-14 LAB — URINALYSIS, ROUTINE W REFLEX MICROSCOPIC
Bilirubin Urine: NEGATIVE
Glucose, UA: >=500 mg/dL — AB
Hgb urine dipstick: NEGATIVE
Ketones, ur: NEGATIVE mg/dL
Leukocytes, UA: NEGATIVE
Nitrite: NEGATIVE
Protein, ur: NEGATIVE mg/dL
Specific Gravity, Urine: 1 — ABNORMAL LOW (ref 1.005–1.030)
pH: 7 (ref 5.0–8.0)

## 2017-06-14 LAB — GLUCOSE, CAPILLARY
GLUCOSE-CAPILLARY: 205 mg/dL — AB (ref 65–99)
Glucose-Capillary: 186 mg/dL — ABNORMAL HIGH (ref 65–99)

## 2017-06-14 LAB — ABO/RH: ABO/RH(D): O POS

## 2017-06-14 MED ORDER — ZOLPIDEM TARTRATE 5 MG PO TABS
5.0000 mg | ORAL_TABLET | Freq: Every evening | ORAL | Status: DC | PRN
  Administered 2017-06-15: 5 mg via ORAL
  Filled 2017-06-14: qty 1

## 2017-06-14 MED ORDER — LACTATED RINGERS IV SOLN
INTRAVENOUS | Status: DC
  Administered 2017-06-14 – 2017-06-15 (×4): via INTRAVENOUS

## 2017-06-14 MED ORDER — GLYBURIDE 2.5 MG PO TABS
3.7500 mg | ORAL_TABLET | Freq: Every day | ORAL | Status: DC
  Administered 2017-06-14: 3.75 mg via ORAL
  Filled 2017-06-14 (×2): qty 1

## 2017-06-14 MED ORDER — BETAMETHASONE SOD PHOS & ACET 6 (3-3) MG/ML IJ SUSP
12.0000 mg | INTRAMUSCULAR | Status: AC
  Administered 2017-06-14: 12 mg via INTRAMUSCULAR
  Filled 2017-06-14: qty 2

## 2017-06-14 MED ORDER — GLYBURIDE 2.5 MG PO TABS: 2.5000 mg | ORAL_TABLET | Freq: Two times a day (BID) | ORAL | Status: DC

## 2017-06-14 MED ORDER — PRENATAL MULTIVITAMIN CH
1.0000 | ORAL_TABLET | Freq: Every day | ORAL | Status: DC
  Administered 2017-06-14 – 2017-06-15 (×2): 1 via ORAL
  Filled 2017-06-14 (×3): qty 1

## 2017-06-14 MED ORDER — MAGNESIUM SULFATE BOLUS VIA INFUSION
4.0000 g | Freq: Once | INTRAVENOUS | Status: AC
  Administered 2017-06-14: 4 g via INTRAVENOUS
  Filled 2017-06-14: qty 500

## 2017-06-14 MED ORDER — TERBUTALINE SULFATE 1 MG/ML IJ SOLN
0.2500 mg | Freq: Once | INTRAMUSCULAR | Status: AC
  Administered 2017-06-14: 0.25 mg via SUBCUTANEOUS
  Filled 2017-06-14: qty 1

## 2017-06-14 MED ORDER — CALCIUM CARBONATE ANTACID 500 MG PO CHEW: 2.0000 | CHEWABLE_TABLET | ORAL | Status: DC | PRN

## 2017-06-14 MED ORDER — MAGNESIUM SULFATE 40 G IN LACTATED RINGERS - SIMPLE
2.5000 g/h | INTRAVENOUS | Status: DC
  Administered 2017-06-14: 2.5 g/h via INTRAVENOUS
  Administered 2017-06-14: 2 g/h via INTRAVENOUS
  Administered 2017-06-14: 2.5 g/h via INTRAVENOUS
  Filled 2017-06-14 (×2): qty 40

## 2017-06-14 MED ORDER — DOCUSATE SODIUM 100 MG PO CAPS
100.0000 mg | ORAL_CAPSULE | Freq: Every day | ORAL | Status: DC
  Administered 2017-06-14 – 2017-06-15 (×2): 100 mg via ORAL
  Filled 2017-06-14 (×3): qty 1

## 2017-06-14 NOTE — Progress Notes (Signed)
Inpatient Diabetes Program Recommendations   ADA Standards of Care 2019 Diabetes in Pregnancy Target Glucose Ranges:  Fasting: 60 - 90 mg/dL Preprandial: 60 - 409105 mg/dL 1 hr postprandial: Less than 140mg /dL (from first bite of meal) 2 hr postprandial: Less than 120 mg/dL (from first bite of meal)     Review of Glycemic Control  Diabetes history: Gestational DM Outpatient Diabetes medications: Glyburide 3.75 mg qhs Current orders for Inpatient glycemic control: Glyburide 3.75 mg qhs  Inpatient Diabetes Program Recommendations:    Patient received Betamethasone in office and plan is to receive another dose today. Will watch trends. Consider changing diet to Gestational DM tray.  Thanks,  Christena DeemShannon Misaki Sozio RN, MSN, BC-ADM, [facility] Inpatient Diabetes Coordinator Team Pager [phone] (8a-5p)

## 2017-06-14 NOTE — Progress Notes (Signed)
Faculty Practice OB/GYN Attending Note  Subjective:  Called to evaluate patient with increased pain with contractions. FHR reassuring, no LOF or vaginal bleeding. Good FM. Patient is Spanish-speaking only, Spanish interpreter present for this encounter.  Admitted on 06/13/2017 for Preterm labor in third trimester.    Objective:  Blood pressure (!) 101/57, pulse (!) 111, temperature 98.3 F (36.8 C), temperature source Oral, resp. rate 16, height 5' 1.02" (1.55 m), weight 196 lb (88.9 kg), last menstrual period 09/27/2016, SpO2 99 %. FHT  Baseline 135 bpm, moderate variability, +accelerations, no decelerations Toco:q5-6 minutes Gen: NAD HENT: Normocephalic, atraumatic Lungs: Normal respiratory effort Heart: Regular rate noted Abdomen: NT gravid fundus, soft Cervix: 3/50/-3/vertex. unchanged Ext: 2+ DTRs, no edema, no cyanosis, negative Homan's sign  Assessment & Plan:  32 y.o. [id number] at 4877w3d admitted for PTL, with frequent contractions and pain. Has A2GDM too.  -  Increase magnesium sulfate to 2.5 g/hr, analgesia as needed -  Continue betamethasone series -  Neonatologist consulted (Dr. Katrinka BlazingSmith called) -  Monitor BS, home glyburide ordered. Will adjust regimen as needed given betamethasone administration.  -  Routine antenatal care   Jaynie CollinsUGONNA  Catherene Kaleta, MD, FACOG Obstetrician & Gynecologist, [facility], [facility]

## 2017-06-14 NOTE — H&P (Addendum)
Julie Brooks is a 32 y.o. female presenting for uterine contractions   Have gotten more severe as the day has progressed.  Was seen today and cervix was 1cm.  Was given 17-P and first dose of Betamethasone.  Is gestational diabetic on Glyburide with good control.  History of one preterm delivery at 36 weeks and two SABs  Speaks some English, but interpretor used.  Patient Active Problem List   Diagnosis Date Noted  . Language barrier 02/22/2017  . Supervision of high risk pregnancy, antepartum 02/02/2017  . History of preterm delivery, currently pregnant 02/02/2017  . Gestational diabetes 02/02/2017    RN Note: Pt states she has been contracting all day, was seen in the clinic and was 1cm dilated but it has gotten worse since.  Denies LOF/VB   . OB History    Gravida  4   Para  1   Term  0   Preterm  1   AB  2   Living  1     SAB  2   TAB  0   Ectopic  0   Multiple  0   Live Births  1          Past Medical History:  Diagnosis Date  . Gestational diabetes    Past Surgical History:  Procedure Laterality Date  . APPENDECTOMY    . DILATION AND CURETTAGE OF UTERUS  2004   Family History: family history includes Asthma in her daughter; Breast cancer in her paternal aunt; Diabetes in her father and mother; Prostate cancer in her paternal uncle. Social History:  reports that she has never smoked. She has never used smokeless tobacco. She reports that she does not drink alcohol or use drugs.     Maternal Diabetes: Yes:  Diabetes Type:  Insulin/Medication controlled Genetic Screening: Normal Maternal Ultrasounds/Referrals: Normal Fetal Ultrasounds or other Referrals:  None Maternal Substance Abuse:  No Significant Maternal Medications:  Meds include: Other:  Glyburide Significant Maternal Lab Results:  None Other Comments:  None  Review of Systems  Constitutional: Negative for chills and fever.  Respiratory: Negative for shortness of breath.    Cardiovascular: Negative for leg swelling.  Gastrointestinal: Positive for abdominal pain. Negative for constipation, diarrhea, nausea and vomiting.  Musculoskeletal: Positive for back pain.   Maternal Medical History:  Reason for admission: Contractions.  Nausea.  Contractions: Onset was 6-12 hours ago.   Frequency: regular.   Perceived severity is moderate.    Fetal activity: Perceived fetal activity is normal.   Last perceived fetal movement was within the past hour.    Prenatal complications: Preterm labor.   No bleeding, PIH or pre-eclampsia.   Prenatal Complications - Diabetes: gestational. Diabetes is managed by oral agent (monotherapy).      Dilation: 3 Effacement (%): 70 Station: -2 Exam by:: Wynelle Bourgeois, CNM Blood pressure 117/71, pulse (!) 102, temperature 98.6 F (37 C), resp. rate 17, weight 196 lb (88.9 kg), last menstrual period 09/27/2016, SpO2 97 %. Maternal Exam:  Uterine Assessment: Contraction strength is moderate.  Contraction frequency is regular.   Abdomen: Patient reports no abdominal tenderness. Fetal presentation: vertex  Introitus: Normal vulva. Normal vagina.  Ferning test: not done.  Nitrazine test: not done. Amniotic fluid character: not assessed.  Pelvis: adequate for delivery.   Cervix: Cervix evaluated by digital exam.     Fetal Exam Fetal Monitor Review: Mode: ultrasound.   Baseline rate: 140.  Variability: moderate (6-25 bpm).   Pattern: accelerations present  and no decelerations.    Fetal State Assessment: Category I - tracings are normal.     Physical Exam  Constitutional: She is oriented to person, place, and time. She appears well-developed and well-nourished. No distress.  HENT:  Head: Normocephalic.  Cardiovascular: Normal rate and regular rhythm.  Respiratory: Effort normal. No respiratory distress.  GI: Soft. She exhibits no distension. There is no tenderness. There is no rebound and no guarding.   Genitourinary:  Genitourinary Comments: Dilation: 3 Effacement (%): 70 Station: -2 Presentation: Vertex Exam by:: Wynelle BourgeoisMarie Kavin Weckwerth, CNM  Prior exam was 1cm today then 2cm   Musculoskeletal: Normal range of motion.  Neurological: She is alert and oriented to person, place, and time.  Skin: Skin is warm and dry.  Psychiatric: She has a normal mood and affect.    Was contracting and 2cm on arrival Gave Procardia series x 4 doses without effect Dr Despina HiddenEure consulted.  Will admit   Prenatal labs: ABO, Rh: O/Positive/-- (11/29 0000) Antibody: Negative (11/29 0000) Rubella: Immune (11/26 0000) RPR: Non Reactive (03/21 1021)  HBsAg: Negative (11/26 0000)  HIV: Non Reactive (03/21 1021)  GBS:     Assessment/Plan: Single IUP at 1076w3d Preterm labor Hx prior preterm delivery  Admit to Antenatal  Betamethasone given in office, next dose due about noon Given Procardia series and Terbutaline Magnesium Sulfate infusion ordered Expectant management   Wynelle BourgeoisMarie Celia Gibbons 06/14/2017, 2:41 AM

## 2017-06-14 NOTE — Progress Notes (Signed)
I have reviewed this chart and agree with the RN/CMA assessment and management.    Laasya Peyton C Yishai Rehfeld, MD, FACOG Attending Physician, [facility]  

## 2017-06-15 LAB — GLUCOSE, CAPILLARY
GLUCOSE-CAPILLARY: 144 mg/dL — AB (ref 65–99)
Glucose-Capillary: 124 mg/dL — ABNORMAL HIGH (ref 65–99)
Glucose-Capillary: 146 mg/dL — ABNORMAL HIGH (ref 65–99)

## 2017-06-15 LAB — WET PREP, GENITAL
CLUE CELLS WET PREP: NONE SEEN
Sperm: NONE SEEN
Trich, Wet Prep: NONE SEEN
Yeast Wet Prep HPF POC: NONE SEEN

## 2017-06-15 MED ORDER — TRAMADOL HCL 50 MG PO TABS: 50.0000 mg | ORAL_TABLET | Freq: Four times a day (QID) | ORAL | 0 refills | Status: DC | PRN

## 2017-06-15 MED ORDER — GLYBURIDE 2.5 MG PO TABS: ORAL_TABLET | ORAL | 2 refills | Status: DC

## 2017-06-15 MED ORDER — NIFEDIPINE ER OSMOTIC RELEASE 30 MG PO TB24
30.0000 mg | ORAL_TABLET | Freq: Once | ORAL | Status: AC
  Administered 2017-06-15: 30 mg via ORAL
  Filled 2017-06-15: qty 1

## 2017-06-15 MED ORDER — NIFEDIPINE ER 30 MG PO TB24: 30.0000 mg | ORAL_TABLET | Freq: Two times a day (BID) | ORAL | 3 refills | Status: DC

## 2017-06-15 MED ORDER — NIFEDIPINE ER 30 MG PO TB24: 30.0000 mg | ORAL_TABLET | Freq: Two times a day (BID) | ORAL | 2 refills | Status: DC

## 2017-06-15 MED ORDER — TRAMADOL HCL 50 MG PO TABS
50.0000 mg | ORAL_TABLET | Freq: Four times a day (QID) | ORAL | Status: DC | PRN
  Administered 2017-06-15: 50 mg via ORAL
  Filled 2017-06-15: qty 1

## 2017-06-15 MED ORDER — FLUCONAZOLE 150 MG PO TABS
150.0000 mg | ORAL_TABLET | Freq: Once | ORAL | Status: AC
  Administered 2017-06-15: 150 mg via ORAL
  Filled 2017-06-15: qty 1

## 2017-06-15 MED ORDER — DOCUSATE SODIUM 100 MG PO CAPS: 100.0000 mg | ORAL_CAPSULE | Freq: Every day | ORAL | 2 refills | Status: DC

## 2017-06-15 MED ORDER — CYCLOBENZAPRINE HCL 10 MG PO TABS
10.0000 mg | ORAL_TABLET | Freq: Three times a day (TID) | ORAL | Status: DC | PRN
  Administered 2017-06-15: 10 mg via ORAL
  Filled 2017-06-15 (×2): qty 1

## 2017-06-15 MED ORDER — GLYBURIDE 2.5 MG PO TABS
2.5000 mg | ORAL_TABLET | Freq: Every day | ORAL | Status: DC
  Administered 2017-06-15: 2.5 mg via ORAL
  Filled 2017-06-15 (×2): qty 1

## 2017-06-15 MED ORDER — CYCLOBENZAPRINE HCL 10 MG PO TABS: 10.0000 mg | ORAL_TABLET | Freq: Three times a day (TID) | ORAL | 3 refills | Status: DC | PRN

## 2017-06-15 NOTE — Progress Notes (Signed)
Pt discharged to home with husband and family.  Discharge instructions reviewed with pt and husband together using [employer]pre[employer] [phone] "Charlcie Cradleaul".  Pt to car via wheelchair with Ronald Lobo. Lloyd-Gainey, NT.  No equipment for home ordered at discharge.

## 2017-06-15 NOTE — Discharge Instructions (Signed)
Información sobre parto y trabajo de parto prematuros °(Preterm Labor and Birth Information) °La duración de un embarazo normal es de 39 a 41 semanas. Se llama trabajo de parto prematuro cuando se inicia antes de las 37 semanas de embarazo. °¿CUÁLES SON LOS FACTORES DE RIESGO DEL TRABAJO DE PARTO PREMATURO? °Existen mayores probabilidades de trabajo de parto prematuro en mujeres con las siguientes características: °· Tienen ciertas infecciones durante el embarazo, como infección de vejiga, infección de transmisión sexual o infección en el útero (corioamnionitis). °· Tienen el cuello del útero más corto que lo normal. °· Tuvieron trabajo de parto prematuro anteriormente. °· Se sometieron a una cirugía en el cuello del útero. °· Son menores de 17 años o mayores de 35 años de edad. °· Son afroamericanas. °· Están embarazadas de mellizos o de varios bebés (gestación múltiple). °· Consumen drogas o fuman mientras están embarazadas. °· No aumentan de peso lo suficiente durante el embarazo. °· Se embarazan poco después de haber estado embarazadas. °¿CUÁLES SON LOS SÍNTOMAS DEL TRABAJO DE PARTO PREMATURO? °Los síntomas del trabajo de parto prematuro incluyen lo siguiente: °· Calambres similares a los que ocurren durante el período menstrual. Los calambres pueden presentarse con diarrea. °· Dolor en el abdomen o en la parte inferior de la espalda. °· Contracciones uterinas regulares que se pueden sentir como una presión en el abdomen. °· Una sensación de mayor presión en la pelvis. °· Aumento de la secreción de moco acuoso o sanguinolento en la vagina. °· Rotura de bolsa (rotura de saco amniótico). °¿POR QUÉ ES IMPORTANTE RECONOCER LOS SIGNOS DEL TRABAJO DE PARTO PREMATURO? °Es importante reconocer los signos del trabajo de parto prematuro porque los bebés que nacen de forma prematura pueden no estar completamente desarrollados. Por lo tanto, pueden correr mayor riesgo de lo siguiente: °· Problemas cardíacos y pulmonares a  largo plazo (crónicos). °· Inmediatamente después del parto, dificultades para regular los sistemas corporales, que incluyen glucemia, temperatura corporal, frecuencia cardíaca y frecuencia respiratoria. °· Hemorragia cerebral. °· Parálisis cerebral. °· Dificultades en el aprendizaje. °· Muerte. °Estos riesgos son mucho mayores para bebés que nacen antes de las 34 semanas de embarazo. °¿CÓMO SE TRATA EL TRABAJO DE PARTO PREMATURO? °El tratamiento depende del tiempo de su embarazo, su afección y la salud de su bebé. Puede incluir lo siguiente: °· Tener un punto (sutura) en el cuello del útero para evitar que este se abra demasiado pronto (cerclaje). °· Tomar medicamentos, por ejemplo: °? Medicamentos hormonales. Estos se pueden administrar de forma temprana en el embarazo para ayudar a mantener el embarazo. °? Medicamentos para detener las contracciones. °? Medicamentos que ayudan a madurar los pulmones del bebé. Estos se pueden recetar si el riesgo de parto es alto. °? Medicamentos para evitar que el bebé desarrolle parálisis cerebral. °Si el trabajo de parto de inicia antes de las 34 semanas de embarazo, es posible que deba hospitalizarse. °¿QUÉ DEBO HACER SI CREO QUE ESTOY EN TRABAJO DE PARTO PREMATURO? °Si cree que está iniciando trabajo de parto prematuro, llame al médico de inmediato. °¿CÓMO PUEDO EVITAR EL TRABAJO DE PARTO PREMATURO EN FUTUROS EMBARAZOS? °Para aumentar las probabilidades de tener un embarazo a término, tenga en cuenta lo siguiente: °· No consuma ningún producto que contenga tabaco, lo que incluye cigarrillos, tabaco de mascar y cigarrillos electrónicos. Si necesita ayuda para dejar de fumar, consulte al médico. °· No consuma drogas ni medicamentos que no sean recetados durante el embarazo. °· Hable con el médico antes de tomar suplementos a base de hierbas aunque los haya estado tomando   periódicamente. °· Asegúrese de llegar a un peso saludable durante el embarazo. °· Tenga cuidado con las  infecciones. Si cree que puede tener una infección, consulte al médico para que la revisen. °· Asegúrese de informarle al médico si ha tenido trabajo de parto prematuro antes. °Esta información no tiene como fin reemplazar el consejo del médico. Asegúrese de hacerle al médico cualquier pregunta que tenga. °Document Released: 05/16/2007 Document Revised: 10/09/2012 Document Reviewed: 06/30/2015 °Elsevier Interactive Patient Education © 2018 Elsevier Inc[employer] ° °

## 2017-06-15 NOTE — Discharge Summary (Signed)
Antenatal Physician Discharge Summary  Patient ID: Julie Brooks MRN: [id number] DOB/AGE: [birth year] 32 y.o.  Admit date: 06/13/2017 Discharge date: 06/15/2017  Admission Diagnoses: Preterm labor at [redacted] weeks gestation; A2GDM  Discharge Diagnoses: The same  Prenatal Procedures: NST  Hospital Course:  This is a 32 y.o. [id number] with IUP at [redacted]w[redacted]d admitted for preterm labor She was admitted with contractions, noted to have a cervical exam of 3/50/-3.  No leaking of fluid and no bleeding.  She was initially started on magnesium sulfate for tocolysis and neuroprotection and also received betamethasone x 2 doses.  Her tocolysis was transitioned to Procardia XL 30 mg po bid on the day of discharge.  Also received Flexeril and Tramadol prn pain.  She was observed, fetal heart rate monitoring remained reassuring, and she had no signs/symptoms of progressing preterm labor or other maternal-fetal concerns.  Her cervical exam was unchanged from admission.  She was deemed stable for discharge to home with outpatient follow up.  Of note, her blood sugars were elevated after betamethasone administration; Glyburide was adjusted accordingly. Will follow up in clinic.    Discharge Exam: Temp:  [97.6 F (36.4 C)-99 F (37.2 C)] 98.3 F (36.8 C) (04/26 1602) Pulse Rate:  [91-104] 97 (04/26 1602) Resp:  [16-20] 18 (04/26 1602) BP: (97-116)/(55-68) 97/55 (04/26 1602) SpO2:  [97 %-100 %] 99 % (04/26 1602) Physical Examination: CONSTITUTIONAL: Well-developed, well-nourished female in no acute distress.  HENT:  Normocephalic, atraumatic, External right and left ear normal. Oropharynx is clear and moist EYES: Conjunctivae and EOM are normal. Pupils are equal, round, and reactive to light. No scleral icterus.  NECK: Normal range of motion, supple, no masses SKIN: Skin is warm and dry. No rash noted. Not diaphoretic. No erythema. No pallor. NEUROLGIC: Alert and oriented to person, place, and time. Normal  reflexes, muscle tone coordination. No cranial nerve deficit noted. PSYCHIATRIC: Normal mood and affect. Normal behavior. Normal judgment and thought content. CARDIOVASCULAR: Normal heart rate noted, regular rhythm RESPIRATORY: Effort and breath sounds normal, no problems with respiration noted MUSCULOSKELETAL: Normal range of motion. No edema and no tenderness. 2+ distal pulses. ABDOMEN: Soft, nontender, nondistended, gravid. CERVIX: Dilation: 3 Effacement (%): 50 Station: -2 Presentation: Vertex Exam by:: Enriqueta Augusta  Fetal monitoring: FHR: 140 bpm, Variability: moderate, Accelerations: Present, Decelerations: Absent  Uterine activity: 1-2 contractions per hour  Significant Diagnostic Studies:  Results for orders placed or performed during the hospital encounter of 06/13/17 (from the past 168 hour(s))  Urinalysis, Routine w reflex microscopic   Collection Time: 06/14/17  2:18 AM  Result Value Ref Range   Color, Urine COLORLESS (A) YELLOW   APPearance CLEAR CLEAR   Specific Gravity, Urine 1.000 (L) 1.005 - 1.030   pH 7.0 5.0 - 8.0   Glucose, UA >=500 (A) NEGATIVE mg/dL   Hgb urine dipstick NEGATIVE NEGATIVE   Bilirubin Urine NEGATIVE NEGATIVE   Ketones, ur NEGATIVE NEGATIVE mg/dL   Protein, ur NEGATIVE NEGATIVE mg/dL   Nitrite NEGATIVE NEGATIVE   Leukocytes, UA NEGATIVE NEGATIVE   WBC, UA 0-5 0 - 5 WBC/hpf   Bacteria, UA RARE (A) NONE SEEN   Squamous Epithelial / LPF 0-5 0 - 5   Mucus PRESENT   ABO/Rh   Collection Time: 06/14/17  2:35 AM  Result Value Ref Range   ABO/RH(D)      O POS Performed at [facility], [address], [place], [place] [address]   Type and screen [facility] [place]   Collection Time:  06/14/17  2:38 AM  Result Value Ref Range   ABO/RH(D) O POS    Antibody Screen NEG    Sample Expiration      06/17/2017 Performed at [facility], [address] [place], [place] [address]   Glucose, capillary   Collection Time: 06/14/17  2:49  PM  Result Value Ref Range   Glucose-Capillary 186 (H) 65 - 99 mg/dL  Glucose, capillary   Collection Time: 06/14/17 11:35 PM  Result Value Ref Range   Glucose-Capillary 205 (H) 65 - 99 mg/dL  Glucose, capillary   Collection Time: 06/15/17  5:57 AM  Result Value Ref Range   Glucose-Capillary 124 (H) 65 - 99 mg/dL  Glucose, capillary   Collection Time: 06/15/17 10:35 AM  Result Value Ref Range   Glucose-Capillary 146 (H) 65 - 99 mg/dL  Wet prep, genital   Collection Time: 06/15/17 12:43 PM  Result Value Ref Range   Yeast Wet Prep HPF POC NONE SEEN NONE SEEN   Trich, Wet Prep NONE SEEN NONE SEEN   Clue Cells Wet Prep HPF POC NONE SEEN NONE SEEN   WBC, Wet Prep HPF POC MODERATE (A) NONE SEEN   Sperm NONE SEEN   Glucose, capillary   Collection Time: 06/15/17  2:34 PM  Result Value Ref Range   Glucose-Capillary 144 (H) 65 - 99 mg/dL    Future Appointments: Future Appointments  Date Time Provider Department Center  06/20/2017 11:15 AM [facility]OC-[facility] NST WOC-[facility]A WOC  06/29/2017 10:15 AM [facility] NST [facility] WOC  06/29/2017 11:15 AM Reva Bores, MD [facility] WOC  06/29/2017  3:00 PM WH-MFC [place] 3 WH-MFCUS MFC-US    Discharge Condition: Stable  Disposition: Discharge disposition: 01-Home or Self Care       Discharge Instructions    Discharge activity:   Complete by:  As directed    Limited activities as discussed   Discharge diet:   Complete by:  As directed    Diabetic diet   Do not have sex or do anything that might make you have an orgasm   Complete by:  As directed    Fetal Kick Count:  Lie on our left side for one hour after a meal, and count the number of times your baby kicks.  If it is less than 5 times, get up, move around and drink some juice.  Repeat the test 30 minutes later.  If it is still less than 5 kicks in an hour, notify your doctor.   Complete by:  As directed    Notify physician for a general feeling that "something is not right"   Complete by:  As  directed    Notify physician for increase or change in vaginal discharge   Complete by:  As directed    Notify physician for leaking of fluid   Complete by:  As directed    Notify physician for pelvic pressure   Complete by:  As directed    Notify physician for vaginal bleeding   Complete by:  As directed    PRETERM LABOR:  Includes any of the follwing symptoms that occur between 20 - [redacted] weeks gestation.  If these symptoms are not stopped, preterm labor can result in preterm delivery, placing your baby at risk   Complete by:  As directed      Allergies as of 06/15/2017      Reactions   Azithromycin Swelling   Possible reaction to current medication   Fioricet [butalbital-apap-caffeine] Swelling   Possible  reaction to current medication      Medication List    TAKE these medications   aspirin EC 81 MG tablet Take 1 tablet (81 mg total) by mouth daily.   cyclobenzaprine 10 MG tablet Commonly known as:  FLEXERIL Take 1 tablet (10 mg total) by mouth 3 (three) times daily as needed for muscle spasms.   docusate sodium 100 MG capsule Commonly known as:  COLACE Take 1 capsule (100 mg total) by mouth daily. Start taking on:  06/16/2017   glyBURIDE 2.5 MG tablet Commonly known as:  DIABETA Take 1 tablet after breakfast and 1.5 tablets at bedtime What changed:    how much to take  how to take this  when to take this  additional instructions   hydrocortisone ointment 0.5 % Apply 1 application topically 2 (two) times daily.   NIFEdipine 30 MG 24 hr tablet Commonly known as:  PROCARDIA-XL/ADALAT CC Take 1 tablet (30 mg total) by mouth 2 (two) times daily.   Prenatal Vitamin 27-0.8 MG Tabs Take 1 tablet by mouth daily.   traMADol 50 MG tablet Commonly known as:  ULTRAM Take 1 tablet (50 mg total) by mouth every 6 (six) hours as needed for moderate pain or severe pain.        Signed: Jaynie CollinsUgonna Yehonatan Grandison M.D. 06/15/2017, 4:30 PM

## 2017-06-18 ENCOUNTER — Other Ambulatory Visit: Payer: Self-pay

## 2017-06-18 ENCOUNTER — Encounter (HOSPITAL_COMMUNITY): Payer: Self-pay | Admitting: *Deleted

## 2017-06-18 ENCOUNTER — Inpatient Hospital Stay (HOSPITAL_COMMUNITY)
Admission: AD | Admit: 2017-06-18 | Discharge: 2017-06-18 | Disposition: A | Discharge status: Home / Self Care | Payer: Self-pay | Source: Ambulatory Visit | Attending: Obstetrics & Gynecology | Admitting: Obstetrics & Gynecology

## 2017-06-18 DIAGNOSIS — O479 False labor, unspecified: Secondary | ICD-10-CM

## 2017-06-18 DIAGNOSIS — O4703 False labor before 37 completed weeks of gestation, third trimester: Secondary | ICD-10-CM | POA: Insufficient documentation

## 2017-06-18 DIAGNOSIS — Z7982 Long term (current) use of aspirin: Secondary | ICD-10-CM | POA: Insufficient documentation

## 2017-06-18 DIAGNOSIS — O4700 False labor before 37 completed weeks of gestation, unspecified trimester: Secondary | ICD-10-CM

## 2017-06-18 DIAGNOSIS — Z3A34 34 weeks gestation of pregnancy: Secondary | ICD-10-CM | POA: Insufficient documentation

## 2017-06-18 LAB — URINALYSIS, ROUTINE W REFLEX MICROSCOPIC
Bilirubin Urine: NEGATIVE
Glucose, UA: NEGATIVE mg/dL
Ketones, ur: NEGATIVE mg/dL
Nitrite: NEGATIVE
PH: 7 (ref 5.0–8.0)
Protein, ur: NEGATIVE mg/dL
SPECIFIC GRAVITY, URINE: 1.005 (ref 1.005–1.030)

## 2017-06-18 NOTE — MAU Note (Signed)
Pain and pressure in vagina started last night.  Getting worse. Had been in hosp, dc'd on Friday. Stomach gets hard.

## 2017-06-18 NOTE — MAU Provider Note (Signed)
History     CSN: [id number]  Arrival date and time: 06/18/17 1357   First Provider Initiated Contact with Patient 06/18/17 1534      Chief Complaint  Patient presents with  . Vaginal Pain  . Vaginal Bleeding   HPI   Ms.Julie Brooks is a 32 y.o. female [phone] @ [redacted]w[redacted]d here in MAU with contractions. Says the contractions became strong 1 hour prior to her arrival to MAU. History of 36 week preterm vaginal delivery. She did receive betamethasone at her last visit here.  Says she saw some sticky, thick discharge and that is why she called EMS. + fetal movement. No bleeding.   OB History    Gravida  4   Para  1   Term  0   Preterm  1   AB  2   Living  1     SAB  2   TAB  0   Ectopic  0   Multiple  0   Live Births  1           History reviewed. No pertinent past medical history.  Past Surgical History:  Procedure Laterality Date  . APPENDECTOMY    . DILATION AND CURETTAGE OF UTERUS  2004    Family History  Problem Relation Age of Onset  . Diabetes Father   . Diabetes Mother   . Asthma Daughter   . Breast cancer Paternal Aunt   . Prostate cancer Paternal Uncle     Social History   Tobacco Use  . Smoking status: Never Smoker  . Smokeless tobacco: Never Used  Substance Use Topics  . Alcohol use: No  . Drug use: No    Allergies:  Allergies  Allergen Reactions  . Azithromycin Swelling    Possible reaction to current medication  . Fioricet [Butalbital-Apap-Caffeine] Swelling    Possible reaction to current medication    Medications Prior to Admission  Medication Sig Dispense Refill Last Dose  . aspirin EC 81 MG tablet Take 1 tablet (81 mg total) by mouth daily. 90 tablet 3 06/18/17 at 0700  . cyclobenzaprine (FLEXERIL) 10 MG tablet Take 1 tablet (10 mg total) by mouth 3 (three) times daily as needed for muscle spasms. 30 tablet 3   . docusate sodium (COLACE) 100 MG capsule Take 1 capsule (100 mg total) by mouth daily. 30 capsule 2   .  glyBURIDE (DIABETA) 2.5 MG tablet Take 1 tablet after breakfast and 1.5 tablets at bedtime 60 tablet 2 06/18/17 at 0700  . hydrocortisone ointment 0.5 % Apply 1 application topically 2 (two) times daily. 30 g 1 06/13/2017 at Unknown time  . NIFEdipine (PROCARDIA-XL/ADALAT CC) 30 MG 24 hr tablet Take 1 tablet (30 mg total) by mouth 2 (two) times daily. 60 tablet 2 06/18/2017 at 1100  . Prenatal Vit-Fe Fumarate-FA (PRENATAL VITAMIN) 27-0.8 MG TABS Take 1 tablet by mouth daily. 30 tablet 6 06/18/17 at 1100  . traMADol (ULTRAM) 50 MG tablet Take 1 tablet (50 mg total) by mouth every 6 (six) hours as needed for moderate pain or severe pain. 30 tablet 0 06/17/17   Results for orders placed or performed during the hospital encounter of 06/18/17 (from the past 48 hour(s))  Urinalysis, Routine w reflex microscopic     Status: Abnormal   Collection Time: 06/18/17  2:26 PM  Result Value Ref Range   Color, Urine COLORLESS (A) YELLOW   APPearance CLEAR CLEAR   Specific Gravity, Urine 1.005 1.005 - 1.030  pH 7.0 5.0 - 8.0   Glucose, UA NEGATIVE NEGATIVE mg/dL   Hgb urine dipstick SMALL (A) NEGATIVE   Bilirubin Urine NEGATIVE NEGATIVE   Ketones, ur NEGATIVE NEGATIVE mg/dL   Protein, ur NEGATIVE NEGATIVE mg/dL   Nitrite NEGATIVE NEGATIVE   Leukocytes, UA MODERATE (A) NEGATIVE   RBC / HPF 0-5 0 - 5 RBC/hpf   WBC, UA 0-5 0 - 5 WBC/hpf   Bacteria, UA RARE (A) NONE SEEN   Squamous Epithelial / LPF 0-5 0 - 5    Comment: Please note change in reference range.   Mucus PRESENT     Comment: Performed at [facility], [address] [place], [place] [address]    Review of Systems  Gastrointestinal: Positive for abdominal pain (Contractions only).  Genitourinary: Positive for vaginal discharge. Negative for vaginal bleeding.   Physical Exam   Blood pressure 109/71, pulse 97, temperature 98.3 F (36.8 C), temperature source Oral, resp. rate (!) 97, weight 195 lb 12 oz (88.8 kg), last menstrual period  09/27/2016.  Physical Exam  Constitutional: She is oriented to person, place, and time. She appears well-developed and well-nourished. No distress.  HENT:  Head: Normocephalic.  Eyes: Pupils are equal, round, and reactive to light.  Neck: Neck supple.  GI: Soft. She exhibits no distension. There is no tenderness.  Genitourinary:  Genitourinary Comments: Dilation: 3 Effacement (%): 60 Cervical Position: Anterior Exam by:: Victorino Dike Francisco Eyerly  Musculoskeletal: Normal range of motion.  Neurological: She is alert and oriented to person, place, and time.  Skin: She is not diaphoretic.  Psychiatric: Her behavior is normal.   Fetal Tracing: Baseline: 150 bpm Variability: Moderate  Accelerations: 15x15 Decelerations: None Toco: 2-4 irregular pattern initially, UI after 1 hour  MAU Course  Procedures  None  MDM  Cervix essentially unchanged after 2 hours 3 cm 80% -3 Patient resting in the room when RN and NP arrived.    Assessment and Plan   A:  False labor Braxton hicks contractions  P:   Discharge home with strict return precautions Follow up with OB as scheduled Return to MAU if symptoms worsen  Finlee Concepcion, Harolyn Rutherford, NP 06/18/2017 5:07 PM

## 2017-06-18 NOTE — Discharge Instructions (Signed)
Información sobre parto y trabajo de parto prematuros °Preterm Labor and Birth Information °El embarazo tiene generalmente una duración de 39 a 41 semanas. El trabajo de parto es prematuro cuando se inicia muy pronto. Comienza antes de completar las 37 semanas de embarazo. °¿Cuáles son los factores de riesgo del trabajo de parto prematuro? °Existen mayores probabilidades de trabajo de parto prematuro en mujeres con las siguientes características: °· Tuvieron una infección durante el embarazo. °· El cuello uterino es corto. °· Tuvieron trabajo de parto prematuro anteriormente. °· Se sometieron a una cirugía en el cuello uterino. °· Son menores de 17 años. °· Tienen más de 35 años. °· Son afroamericanas. °· Están embarazadas de dos o más bebés. °· Consumen drogas mientras están embarazadas. °· Fuman mientras están embarazadas. °· No aumentan de peso lo suficiente durante el embarazo. °· Se embarazaron inmediatamente después de otro embarazo. ° °¿Cuáles son los síntomas del trabajo de parto prematuro? °Los síntomas del trabajo de parto prematuro incluyen lo siguiente: °· Calambres. Los calambres pueden parecerse a los que tiene una mujer durante el período menstrual. Los calambres pueden presentarse con diarrea. °· Dolor de vientre (abdomen). °· Dolor en la zona lumbar. °· Tiene contracciones regulares o endurecimiento del útero. Siente como si el vientre se endurece. °· Presión en la zona inferior del vientre que parece empeorar. °· Pierde más líquido (secreción) por la vagina. El líquido puede ser acuoso o con sangre. °· Ruptura de la bolsa de aguas. ° °¿Por qué es importante notar los signos del trabajo de parto prematuro? °Los bebés que nacen antes de tiempo pueden no estar completamente desarrollados. Estos pueden tener un riesgo mayor de padecer: °· Problemas cardíacos a largo plazo. °· Problemas pulmonares a largo plazo. °· Dificultades para controlar los sistemas corporales, por ejemplo, respirar. °· Hemorragia  cerebral. °· Una afección que se denomina parálisis cerebral. °· Dificultades en el aprendizaje. °· Muerte. ° °Estos riesgos son mucho mayores para bebés que nacen antes de las 34 semanas de embarazo. °¿Cómo se trata el trabajo de parto prematuro? °El tratamiento depende de lo siguiente: °· El tiempo de embarazo. °· Su estado de salud. °· La salud del bebé. ° °El tratamiento puede incluir lo siguiente: °· Un punto (sutura) en el cuello uterino. Al parir, el cuello uterino se abre para que el bebé pueda salir. El punto impide que el cuello uterino se abra antes de tiempo. °· Permanecer en el hospital. °· Tomar medicamentos como, por ejemplo: °? Medicamentos hormonales. °? Medicamentos para detener las contracciones. °? Medicamentos para ayudar a la maduración de los pulmones del bebé. °? Medicamentos para evitar que el bebé desarrolle parálisis cerebral. ° °¿Qué debo hacer si estoy en trabajo de parto prematuro? °Si cree que está en trabajo de parto demasiado pronto, llame a su médico de inmediato. °¿Cómo puedo prevenir el trabajo de parto prematuro? °· No use productos que contengan tabaco. °? Estos incluyen cigarrillos, tabaco para mascar y cigarrillos electrónicos. °? Si necesita ayuda para dejar de fumar, consulte al médico. °· No consuma drogas. °· No tome ningún medicamento si el médico no se lo indicó. °· Consulte al médico antes de empezar a tomar cualquier suplemento de hierbas. °· Asegúrese aumentar de peso como corresponde. °· Tenga cuidado con las infecciones. Si cree que puede tener una infección, consulte al médico para que la revisen inmediatamente. °· Infórmele al médico si ha tenido trabajo de parto prematuro anteriormente. °Esta información no tiene como fin reemplazar el consejo del médico. Asegúrese de hacerle al médico   cualquier pregunta que tenga. °Document Released: 03/11/2010 Document Revised: 05/17/2016 Document Reviewed: 06/30/2015 °Elsevier Interactive Patient Education © 2018 [employer]vier  Inc[employer] ° °

## 2017-06-18 NOTE — MAU Note (Signed)
Patient states her vaginal pressure started again last night but the contractions have not stopped since Friday. Patient reports brown discharge around 1217.

## 2017-06-19 ENCOUNTER — Encounter: Payer: Self-pay | Admitting: Obstetrics and Gynecology

## 2017-06-20 ENCOUNTER — Other Ambulatory Visit: Payer: Self-pay

## 2017-06-20 ENCOUNTER — Ambulatory Visit (INDEPENDENT_AMBULATORY_CARE_PROVIDER_SITE_OTHER): Payer: Self-pay | Admitting: General Practice

## 2017-06-20 ENCOUNTER — Ambulatory Visit: Payer: Self-pay

## 2017-06-20 VITALS — BP 112/56 | HR 85

## 2017-06-20 DIAGNOSIS — O09899 Supervision of other high risk pregnancies, unspecified trimester: Secondary | ICD-10-CM

## 2017-06-20 DIAGNOSIS — O0990 Supervision of high risk pregnancy, unspecified, unspecified trimester: Secondary | ICD-10-CM

## 2017-06-20 DIAGNOSIS — O09213 Supervision of pregnancy with history of pre-term labor, third trimester: Secondary | ICD-10-CM

## 2017-06-20 DIAGNOSIS — O24415 Gestational diabetes mellitus in pregnancy, controlled by oral hypoglycemic drugs: Secondary | ICD-10-CM

## 2017-06-20 DIAGNOSIS — O09219 Supervision of pregnancy with history of pre-term labor, unspecified trimester: Secondary | ICD-10-CM

## 2017-06-20 MED ORDER — HYDROXYPROGESTERONE CAPROATE 275 MG/1.1ML ~~LOC~~ SOAJ
275.0000 mg | Freq: Once | SUBCUTANEOUS | Status: AC
  Administered 2017-06-20: 275 mg via SUBCUTANEOUS

## 2017-06-20 NOTE — Progress Notes (Signed)
Pt informed that the ultrasound is considered a limited OB ultrasound and is not intended to be a complete ultrasound exam.  Patient also informed that the ultrasound is not being completed with the intent of assessing for fetal or placental anomalies or any pelvic abnormalities.  Explained that the purpose of today's ultrasound is to assess for  BPP, presentation and AFI.  Patient acknowledges the purpose of the exam and the limitations of the study.    

## 2017-06-24 ENCOUNTER — Inpatient Hospital Stay (HOSPITAL_COMMUNITY)
Admission: AD | Admit: 2017-06-24 | Discharge: 2017-06-27 | DRG: 805 | Disposition: A | Discharge status: Home / Self Care | Payer: Medicaid Other | Source: Ambulatory Visit | Attending: Obstetrics and Gynecology | Admitting: Obstetrics and Gynecology

## 2017-06-24 ENCOUNTER — Encounter (HOSPITAL_COMMUNITY): Payer: Self-pay | Admitting: *Deleted

## 2017-06-24 DIAGNOSIS — O24425 Gestational diabetes mellitus in childbirth, controlled by oral hypoglycemic drugs: Secondary | ICD-10-CM | POA: Diagnosis present

## 2017-06-24 DIAGNOSIS — O99214 Obesity complicating childbirth: Secondary | ICD-10-CM | POA: Diagnosis present

## 2017-06-24 DIAGNOSIS — Z3A34 34 weeks gestation of pregnancy: Secondary | ICD-10-CM

## 2017-06-24 LAB — CBC
HCT: 36.3 % (ref 36.0–46.0)
Hemoglobin: 12.4 g/dL (ref 12.0–15.0)
MCH: 26.7 pg (ref 26.0–34.0)
MCHC: 34.2 g/dL (ref 30.0–36.0)
MCV: 78.2 fL (ref 78.0–100.0)
PLATELETS: 211 10*3/uL (ref 150–400)
RBC: 4.64 MIL/uL (ref 3.87–5.11)
RDW: 14.5 % (ref 11.5–15.5)
WBC: 13.9 10*3/uL — AB (ref 4.0–10.5)

## 2017-06-24 LAB — URINALYSIS, ROUTINE W REFLEX MICROSCOPIC
BACTERIA UA: NONE SEEN
BILIRUBIN URINE: NEGATIVE
Glucose, UA: NEGATIVE mg/dL
KETONES UR: NEGATIVE mg/dL
LEUKOCYTES UA: NEGATIVE
NITRITE: NEGATIVE
PH: 7 (ref 5.0–8.0)
PROTEIN: NEGATIVE mg/dL
Specific Gravity, Urine: 1.002 — ABNORMAL LOW (ref 1.005–1.030)

## 2017-06-24 LAB — GLUCOSE, CAPILLARY: GLUCOSE-CAPILLARY: 98 mg/dL (ref 65–99)

## 2017-06-24 MED ORDER — ONDANSETRON HCL 4 MG/2ML IJ SOLN: 4.0000 mg | Freq: Four times a day (QID) | INTRAMUSCULAR | Status: DC | PRN

## 2017-06-24 MED ORDER — LACTATED RINGERS IV SOLN: 500.0000 mL | INTRAVENOUS | Status: DC | PRN

## 2017-06-24 MED ORDER — PENICILLIN G POT IN DEXTROSE 60000 UNIT/ML IV SOLN
3.0000 10*6.[IU] | INTRAVENOUS | Status: DC
  Administered 2017-06-25 (×3): 3 10*6.[IU] via INTRAVENOUS
  Filled 2017-06-24 (×7): qty 50

## 2017-06-24 MED ORDER — SODIUM CHLORIDE 0.9 % IV SOLN
5.0000 10*6.[IU] | Freq: Once | INTRAVENOUS | Status: AC
  Administered 2017-06-25: 5 10*6.[IU] via INTRAVENOUS
  Filled 2017-06-24: qty 5

## 2017-06-24 MED ORDER — FLEET ENEMA 7-19 GM/118ML RE ENEM: 1.0000 | ENEMA | RECTAL | Status: DC | PRN

## 2017-06-24 MED ORDER — SOD CITRATE-CITRIC ACID 500-334 MG/5ML PO SOLN: 30.0000 mL | ORAL | Status: DC | PRN

## 2017-06-24 MED ORDER — LIDOCAINE HCL (PF) 1 % IJ SOLN
30.0000 mL | INTRAMUSCULAR | Status: DC | PRN
  Filled 2017-06-24: qty 30

## 2017-06-24 MED ORDER — LACTATED RINGERS IV SOLN
INTRAVENOUS | Status: DC
  Administered 2017-06-24 – 2017-06-25 (×2): via INTRAVENOUS

## 2017-06-24 MED ORDER — FENTANYL CITRATE (PF) 100 MCG/2ML IJ SOLN: 50.0000 ug | INTRAMUSCULAR | Status: DC | PRN

## 2017-06-24 MED ORDER — OXYTOCIN BOLUS FROM INFUSION
500.0000 mL | Freq: Once | INTRAVENOUS | Status: AC
  Administered 2017-06-25: 500 mL via INTRAVENOUS

## 2017-06-24 MED ORDER — OXYTOCIN 40 UNITS IN LACTATED RINGERS INFUSION - SIMPLE MED
2.5000 [IU]/h | INTRAVENOUS | Status: DC
  Administered 2017-06-25: 2.5 [IU]/h via INTRAVENOUS
  Filled 2017-06-24: qty 1000

## 2017-06-24 NOTE — H&P (Signed)
OBSTETRIC ADMISSION HISTORY AND PHYSICAL  Julie Brooks is a 32 y.o. female [phone] with IUP at [redacted]w[redacted]d by LMP presenting for spontaneous onset of labor. She reports regular contractions since this afternoon that have become progressively worse. She reports bright red blood when she wipes. She has A2GDM and takes glyburide. EFW on 4/12 was 5lb 3oz, 88% with AC >97%.  Patient was admitted on 4/25 for preterm labor and given BMZx2 and magnesium for neuro protection.   She reports +FMs, No LOF, no blurry vision, headaches or peripheral edema, and RUQ pain.  She plans on breast and bottle feeding. She is unsure what she desires for birth control. She received her prenatal care at [facility]   Dating: By LMP --->  Estimated Date of Delivery: 07/30/17  Clinic [facility] Prenatal Labs  Dating LMP Blood type: O/Positive/-- (11/29 0000)   Genetic Screen Quad:   WNL  Antibody:Negative (11/29 0000)  Anatomic [place] nml anatomy Rubella: Immune (11/26 0000)  GTT Early:  150 3 hour--100/178/195/175       RPR: Nonreactive (11/29 0000)   Flu vaccine 01/18/17 HBsAg: Negative (11/26 0000)   TDaP vaccine                                            HIV: Negative  Baby Food                                               GBS: (For PCN allergy, check sensitivities)  Contraception  Pap:Neg 01/2016  Circumcision    Pediatrician  CF:Neg   Support Person  SMA  Prenatal Classes  Hgb electrophoresis:   Prenatal History/Complications:  Past Medical History: History reviewed. No pertinent past medical history.  Past Surgical History: Past Surgical History:  Procedure Laterality Date  . APPENDECTOMY    . DILATION AND CURETTAGE OF UTERUS  2004    Obstetrical History: OB History    Gravida  4   Para  1   Term  0   Preterm  1   AB  2   Living  1     SAB  2   TAB  0   Ectopic  0   Multiple  0   Live Births  1           Social History: Social History   Socioeconomic History  . Marital status:  Single    Spouse name: Not on file  . Number of children: Not on file  . Years of education: Not on file  . Highest education level: Not on file  Occupational History  . Not on file  Social Needs  . Financial resource strain: Not on file  . Food insecurity:    Worry: Not on file    Inability: Not on file  . Transportation needs:    Medical: Not on file    Non-medical: Not on file  Tobacco Use  . Smoking status: Never Smoker  . Smokeless tobacco: Never Used  Substance and Sexual Activity  . Alcohol use: No  . Drug use: No  . Sexual activity: Yes    Birth control/protection: None  Lifestyle  . Physical activity:    Days per week: Not on file    Minutes per  session: Not on file  . Stress: Not on file  Relationships  . Social connections:    Talks on phone: Not on file    Gets together: Not on file    Attends religious service: Not on file    Active member of club or organization: Not on file    Attends meetings of clubs or organizations: Not on file    Relationship status: Not on file  Other Topics Concern  . Not on file  Social History Narrative  . Not on file    Family History: Family History  Problem Relation Age of Onset  . Diabetes Father   . Diabetes Mother   . Asthma Daughter   . Breast cancer Paternal Aunt   . Prostate cancer Paternal Uncle     Allergies: Allergies  Allergen Reactions  . Azithromycin Swelling    Possible reaction to current medication  . Fioricet [Butalbital-Apap-Caffeine] Swelling    Possible reaction to current medication    Medications Prior to Admission  Medication Sig Dispense Refill Last Dose  . aspirin EC 81 MG tablet Take 1 tablet (81 mg total) by mouth daily. 90 tablet 3 06/18/2017 at 0700  . cyclobenzaprine (FLEXERIL) 10 MG tablet Take 1 tablet (10 mg total) by mouth 3 (three) times daily as needed for muscle spasms. 30 tablet 3 prn  . docusate sodium (COLACE) 100 MG capsule Take 1 capsule (100 mg total) by mouth daily.  30 capsule 2 06/17/2017 at Unknown time  . glyBURIDE (DIABETA) 2.5 MG tablet Take 1 tablet after breakfast and 1.5 tablets at bedtime 60 tablet 2 06/18/2017 at 0700  . hydrocortisone ointment 0.5 % Apply 1 application topically 2 (two) times daily. 30 g 1 06/17/2017 at Unknown time  . NIFEdipine (PROCARDIA-XL/ADALAT CC) 30 MG 24 hr tablet Take 1 tablet (30 mg total) by mouth 2 (two) times daily. 60 tablet 2 06/18/2017 at 1100  . Prenatal Vit-Fe Fumarate-FA (PRENATAL VITAMIN) 27-0.8 MG TABS Take 1 tablet by mouth daily. 30 tablet 6 06/18/2017 at 1100  . traMADol (ULTRAM) 50 MG tablet Take 1 tablet (50 mg total) by mouth every 6 (six) hours as needed for moderate pain or severe pain. 30 tablet 0 prn   Review of Systems   All systems reviewed and negative except as stated in HPI  Blood pressure 136/83, pulse 87, temperature 97.8 F (36.6 C), resp. rate 19, last menstrual period 09/27/2016, SpO2 97 %. General appearance: alert, cooperative and mild distress Lungs: clear to auscultation bilaterally Heart: regular rate and rhythm Abdomen: soft, non-tender; bowel sounds normal Pelvic: n/a Extremities: Homans sign is negative, no sign of DVT DTR's +2 Presentation: cephalic Fetal monitoringBaseline: 150 bpm, Variability: Good {> 6 bpm), Accelerations: Reactive and Decelerations: Absent Uterine activityFrequency: Every 4-5 minutes Dilation: 4.5 Effacement (%): 90   Prenatal labs: ABO, Rh: --/--/O POS (04/25 0238) Antibody: NEG (04/25 0238) Rubella: Immune (11/26 0000) RPR: Non Reactive (03/21 1021)  HBsAg: Negative (11/26 0000)  HIV: Non Reactive (03/21 1021)  GBS:     Prenatal Transfer Tool  Maternal Diabetes: Yes:  Diabetes Type:  Insulin/Medication controlled Genetic Screening: Normal Maternal Ultrasounds/Referrals: Normal Fetal Ultrasounds or other Referrals:  None Maternal Substance Abuse:  No Significant Maternal Medications:  None Significant Maternal Lab Results: None  Results  for orders placed or performed during the hospital encounter of 06/24/17 (from the past 24 hour(s))  Urinalysis, Routine w reflex microscopic   Collection Time: 06/24/17 10:28 PM  Result Value Ref Range  Color, Urine STRAW (A) YELLOW   APPearance CLEAR CLEAR   Specific Gravity, Urine 1.002 (L) 1.005 - 1.030   pH 7.0 5.0 - 8.0   Glucose, UA NEGATIVE NEGATIVE mg/dL   Hgb urine dipstick MODERATE (A) NEGATIVE   Bilirubin Urine NEGATIVE NEGATIVE   Ketones, ur NEGATIVE NEGATIVE mg/dL   Protein, ur NEGATIVE NEGATIVE mg/dL   Nitrite NEGATIVE NEGATIVE   Leukocytes, UA NEGATIVE NEGATIVE   RBC / HPF 0-5 0 - 5 RBC/hpf   WBC, UA 0-5 0 - 5 WBC/hpf   Bacteria, UA NONE SEEN NONE SEEN   Squamous Epithelial / LPF 0-5 0 - 5    Patient Active Problem List   Diagnosis Date Noted  . Preterm labor in third trimester 06/14/2017  . Language barrier 02/22/2017  . Supervision of high risk pregnancy, antepartum 02/02/2017  . History of preterm delivery, currently pregnant 02/02/2017  . Gestational diabetes 02/02/2017    Assessment/Plan:  Julie Brooks is a 32 y.o. [id number] at [redacted]w[redacted]d here for preterm labor.  #Labor: expectant management #Pain: Per patient request #FWB: Cat 1 #ID:  GBS unknown, will treat due to preterm status #MOF: Both #MOC: unsure #Circ:  n/a  Rolm Bookbinder, CNM  06/24/2017, 11:02 PM

## 2017-06-24 NOTE — MAU Note (Signed)
Pt reports contractions every 3 minutes. Pt states yesterday she noticed some pink bleeding that turned bright red today. Has had some increase in discharge this week.

## 2017-06-25 ENCOUNTER — Inpatient Hospital Stay (HOSPITAL_COMMUNITY): Payer: Medicaid Other | Admitting: Anesthesiology

## 2017-06-25 ENCOUNTER — Other Ambulatory Visit: Payer: Self-pay

## 2017-06-25 ENCOUNTER — Encounter (HOSPITAL_COMMUNITY): Payer: Self-pay | Admitting: *Deleted

## 2017-06-25 DIAGNOSIS — Z3A34 34 weeks gestation of pregnancy: Secondary | ICD-10-CM

## 2017-06-25 LAB — COMPREHENSIVE METABOLIC PANEL
ALT: 16 U/L (ref 14–54)
AST: 25 U/L (ref 15–41)
Albumin: 2.2 g/dL — ABNORMAL LOW (ref 3.5–5.0)
Alkaline Phosphatase: 122 U/L (ref 38–126)
Anion gap: 9 (ref 5–15)
BUN: 8 mg/dL (ref 6–20)
CO2: 19 mmol/L — ABNORMAL LOW (ref 22–32)
Calcium: 8.5 mg/dL — ABNORMAL LOW (ref 8.9–10.3)
Chloride: 108 mmol/L (ref 101–111)
Creatinine, Ser: 0.84 mg/dL (ref 0.44–1.00)
GFR calc Af Amer: >60 mL/min (ref >60)
GFR calc non Af Amer: >60 mL/min (ref >60)
Glucose, Bld: 158 mg/dL — ABNORMAL HIGH (ref 65–99)
Potassium: 3.6 mmol/L (ref 3.5–5.1)
Sodium: 136 mmol/L (ref 135–145)
Total Bilirubin: 0.2 mg/dL — ABNORMAL LOW (ref 0.3–1.2)
Total Protein: 5.9 g/dL — ABNORMAL LOW (ref 6.5–8.1)

## 2017-06-25 LAB — CBC
HCT: 34.6 % — ABNORMAL LOW (ref 36.0–46.0)
Hemoglobin: 11.9 g/dL — ABNORMAL LOW (ref 12.0–15.0)
MCH: 26.9 pg (ref 26.0–34.0)
MCHC: 34.4 g/dL (ref 30.0–36.0)
MCV: 78.1 fL (ref 78.0–100.0)
Platelets: 176 10*3/uL (ref 150–400)
RBC: 4.43 MIL/uL (ref 3.87–5.11)
RDW: 14.7 % (ref 11.5–15.5)
WBC: 17.5 10*3/uL — ABNORMAL HIGH (ref 4.0–10.5)

## 2017-06-25 LAB — TYPE AND SCREEN
ABO/RH(D): O POS
ANTIBODY SCREEN: NEGATIVE

## 2017-06-25 LAB — GLUCOSE, CAPILLARY
GLUCOSE-CAPILLARY: 79 mg/dL (ref 65–99)
Glucose-Capillary: 92 mg/dL (ref 65–99)
Glucose-Capillary: 76 mg/dL (ref 65–99)
Glucose-Capillary: 190 mg/dL — ABNORMAL HIGH (ref 65–99)

## 2017-06-25 LAB — RPR: RPR: NONREACTIVE

## 2017-06-25 MED ORDER — PHENYLEPHRINE 40 MCG/ML (10ML) SYRINGE FOR IV PUSH (FOR BLOOD PRESSURE SUPPORT)
80.0000 ug | PREFILLED_SYRINGE | INTRAVENOUS | Status: DC | PRN
  Filled 2017-06-25: qty 5

## 2017-06-25 MED ORDER — METHYLERGONOVINE MALEATE 0.2 MG/ML IJ SOLN
0.2000 mg | Freq: Once | INTRAMUSCULAR | Status: AC
  Administered 2017-06-25: 0.2 mg via INTRAMUSCULAR

## 2017-06-25 MED ORDER — METHYLERGONOVINE MALEATE 0.2 MG/ML IJ SOLN
0.2000 mg | Freq: Once | INTRAMUSCULAR | Status: DC
  Filled 2017-06-25: qty 1

## 2017-06-25 MED ORDER — PHENYLEPHRINE 40 MCG/ML (10ML) SYRINGE FOR IV PUSH (FOR BLOOD PRESSURE SUPPORT)
80.0000 ug | PREFILLED_SYRINGE | INTRAVENOUS | Status: DC | PRN
  Filled 2017-06-25: qty 10
  Filled 2017-06-25: qty 5

## 2017-06-25 MED ORDER — METHYLERGONOVINE MALEATE 0.2 MG/ML IJ SOLN
INTRAMUSCULAR | Status: AC
  Filled 2017-06-25: qty 1

## 2017-06-25 MED ORDER — FENTANYL 2.5 MCG/ML BUPIVACAINE 1/10 % EPIDURAL INFUSION (WH - ANES)
14.0000 mL/h | INTRAMUSCULAR | Status: DC | PRN
  Administered 2017-06-25 (×2): 14 mL/h via EPIDURAL
  Filled 2017-06-25 (×2): qty 100

## 2017-06-25 MED ORDER — EPHEDRINE 5 MG/ML INJ
10.0000 mg | INTRAVENOUS | Status: DC | PRN
  Filled 2017-06-25: qty 2

## 2017-06-25 MED ORDER — ACETAMINOPHEN 325 MG PO TABS: 650.0000 mg | ORAL_TABLET | Freq: Four times a day (QID) | ORAL | Status: DC | PRN

## 2017-06-25 MED ORDER — LACTATED RINGERS IV SOLN: 500.0000 mL | Freq: Once | INTRAVENOUS | Status: DC

## 2017-06-25 MED ORDER — CEFAZOLIN SODIUM-DEXTROSE 2-4 GM/100ML-% IV SOLN
2.0000 g | Freq: Once | INTRAVENOUS | Status: AC
  Administered 2017-06-25: 2 g via INTRAVENOUS
  Filled 2017-06-25: qty 100

## 2017-06-25 MED ORDER — LIDOCAINE HCL (PF) 1 % IJ SOLN
INTRAMUSCULAR | Status: DC | PRN
  Administered 2017-06-25: 5 mL via EPIDURAL

## 2017-06-25 MED ORDER — CYCLOBENZAPRINE HCL 5 MG PO TABS
5.0000 mg | ORAL_TABLET | Freq: Three times a day (TID) | ORAL | Status: DC | PRN
  Administered 2017-06-25 – 2017-06-26 (×2): 5 mg via ORAL
  Filled 2017-06-25 (×3): qty 1

## 2017-06-25 MED ORDER — DIPHENHYDRAMINE HCL 50 MG/ML IJ SOLN: 12.5000 mg | INTRAMUSCULAR | Status: DC | PRN

## 2017-06-25 NOTE — Anesthesia Pain Management Evaluation Note (Signed)
  CRNA Pain Management Visit Note  Patient: Julie Brooks, 32 y.o., female  "Hello I am a member of the anesthesia team at [facility]. We have an anesthesia team available at all times to provide care throughout the hospital, including epidural management and anesthesia for C-section. I don't know your plan for the delivery whether it a natural birth, water birth, IV sedation, nitrous supplementation, doula or epidural, but we want to meet your pain goals."   1.Was your pain managed to your expectations on prior hospitalizations?   Yes   2.What is your expectation for pain management during this hospitalization?     Epidural  3.How can we help you reach that goal? *support**  Record the patient's initial score and the patient's pain goal.   Pain: 0  Pain Goal: 5 [facility] wants you to be able to say your pain was always managed very well.  [name] 06/25/2017

## 2017-06-25 NOTE — Anesthesia Procedure Notes (Signed)
Epidural Patient location during procedure: OB Start time: 06/25/2017 2:54 AM End time: 06/25/2017 3:18 AM  Staffing Anesthesiologist: Trevor Iha, MD Performed: anesthesiologist   Preanesthetic Checklist Completed: patient identified, site marked, surgical consent, pre-op evaluation, timeout performed, IV checked, risks and benefits discussed and monitors and equipment checked  Epidural Patient position: sitting Prep: site prepped and draped and DuraPrep Patient monitoring: continuous pulse ox and blood pressure Approach: midline Location: L3-L4 Injection technique: LOR air  Needle:  Needle type: Tuohy  Needle gauge: 17 G Needle length: 9 cm and 9 Needle insertion depth: 5 cm cm Catheter type: closed end flexible Catheter size: 19 Gauge Catheter at skin depth: 10 cm Test dose: negative  Assessment Events: blood not aspirated, injection not painful, no injection resistance, negative IV test and no paresthesia

## 2017-06-25 NOTE — Progress Notes (Signed)
LABOR PROGRESS NOTE  Julie Brooks is a 32 y.o. [id number] at [redacted]w[redacted]d  admitted for PTL  Subjective: Patient doing well. Contractions had gotten stronger, and she now has epidural. Reports good pain control.   Objective: BP 125/74   Pulse 73   Temp 98.2 F (36.8 C) (Oral)   Resp 19   LMP 09/27/2016 (Exact Date)   SpO2 98%  or  Vitals:   06/25/17 0430 06/25/17 0500 06/25/17 0530 06/25/17 0600  BP: 123/66 122/69 131/75 125/74  Pulse: 81 84 74 73  Resp:      Temp:      TempSrc:      SpO2:        Last SVE: Dilation: 5 Effacement (%): 70 Station: -2 Presentation: Vertex Exam by:: Gearldine Bienenstock, RN FHT: baseline rate 150, moderate varibility, +acel, no decel Toco: not tracing very well, appears q 1-3 min  Assessment / Plan: 32 y.o. [id number] at [redacted]w[redacted]d here for PTL  Labor: Expectant managemnet for now Fetal Wellbeing:  Cat I Pain Control:  Well-controlled with epidural Anticipated MOD:  SVD  Frederik Pear, MD 06/25/2017, 6:46 AM

## 2017-06-25 NOTE — Anesthesia Postprocedure Evaluation (Signed)
Anesthesia Post Note  Patient: Julie Brooks  Procedure(s) Performed: AN AD HOC LABOR EPIDURAL     Patient location during evaluation: Mother Baby Anesthesia Type: Epidural Level of consciousness: awake and alert and oriented Pain management: satisfactory to patient Vital Signs Assessment: post-procedure vital signs reviewed and stable Respiratory status: respiratory function stable Cardiovascular status: stable Postop Assessment: no headache, no backache, epidural receding, patient able to bend at knees, no signs of nausea or vomiting and adequate PO intake Anesthetic complications: no    Last Vitals:  Vitals:   06/25/17 1831 06/25/17 1930  BP: (!) 155/91 (!) 164/95  Pulse: 68 64  Resp: 20 18  Temp: 37 C 37 C  SpO2:      Last Pain:  Vitals:   06/25/17 2145  TempSrc:   PainSc: 8    Pain Goal:                 Julie Brooks

## 2017-06-25 NOTE — Anesthesia Preprocedure Evaluation (Signed)
Anesthesia Evaluation  Patient identified by MRN, date of birth, ID band Patient awake    Reviewed: Allergy & Precautions, NPO status , Patient's Chart, lab work & pertinent test results  Airway Mallampati: II  TM Distance: >3 FB Neck ROM: Full    Dental no notable dental hx.    Pulmonary neg pulmonary ROS,    Pulmonary exam normal breath sounds clear to auscultation       Cardiovascular Exercise Tolerance: Good hypertension, Pt. on medications negative cardio ROS Normal cardiovascular exam Rhythm:Regular Rate:Normal     Neuro/Psych negative neurological ROS  negative psych ROS   GI/Hepatic negative GI ROS, Neg liver ROS,   Endo/Other  diabetes, GestationalMorbid obesity  Renal/GU negative Renal ROS     Musculoskeletal negative musculoskeletal ROS (+)   Abdominal (+) + obese,   Peds negative pediatric ROS (+)  Hematology negative hematology ROS (+)   Anesthesia Other Findings   Reproductive/Obstetrics (+) Pregnancy                             Lab Results  Component Value Date   WBC 13.9 (H) 06/24/2017   HGB 12.4 06/24/2017   HCT 36.3 06/24/2017   MCV 78.2 06/24/2017   PLT 211 06/24/2017     Anesthesia Physical Anesthesia Plan  ASA: III  Anesthesia Plan: Epidural   Post-op Pain Management:    Induction:   PONV Risk Score and Plan:   Airway Management Planned:   Additional Equipment:   Intra-op Plan:   Post-operative Plan:   Informed Consent: I have reviewed the patients History and Physical, chart, labs and discussed the procedure including the risks, benefits and alternatives for the proposed anesthesia with the patient or authorized representative who has indicated his/her understanding and acceptance.     Plan Discussed with:   Anesthesia Plan Comments:         Anesthesia Quick Evaluation

## 2017-06-26 LAB — CBC
HEMATOCRIT: 33.8 % — AB (ref 36.0–46.0)
Hemoglobin: 11.7 g/dL — ABNORMAL LOW (ref 12.0–15.0)
MCH: 27 pg (ref 26.0–34.0)
MCHC: 34.6 g/dL (ref 30.0–36.0)
MCV: 77.9 fL — AB (ref 78.0–100.0)
Platelets: 199 10*3/uL (ref 150–400)
RBC: 4.34 MIL/uL (ref 3.87–5.11)
RDW: 14.6 % (ref 11.5–15.5)
WBC: 18.6 10*3/uL — ABNORMAL HIGH (ref 4.0–10.5)

## 2017-06-26 LAB — PROTEIN / CREATININE RATIO, URINE
Creatinine, Urine: 59 mg/dL
Protein Creatinine Ratio: 0.51 mg/mg{Cre} — ABNORMAL HIGH (ref 0.00–0.15)
Total Protein, Urine: 30 mg/dL

## 2017-06-26 NOTE — Progress Notes (Signed)
Post Partum Day 1  Subjective: up ad lib, voiding and tolerating PO  Pt reports episode of lightheadedness this morning, in the setting of postpartum hemorrhage. Monitoring H&H  Objective: Blood pressure 133/88, pulse 79, temperature 98.9 F (37.2 C), temperature source Oral, resp. rate 18, last menstrual period 09/27/2016, SpO2 98 %, unknown if currently breastfeeding.  Physical Exam:  General: alert, cooperative and no distress Lochia: appropriate Uterine Fundus: firm, tender to palpation Incision: N/A DVT Evaluation: No evidence of DVT seen on physical exam.  Recent Labs    06/25/17 2116 06/26/17 0614  HGB 11.9* 11.7*  HCT 34.6* 33.8*    Assessment/Plan: Plan for discharge tomorrow and Breastfeeding  Encourage PO hydration   LOS: 2 days   Julie Brooks T Cristie Hem 06/26/2017, 7:35 AM

## 2017-06-26 NOTE — Lactation Note (Signed)
This note was copied from a baby's chart. Lactation Consultation Note  Patient Name: Julie Brooks ZOXWR'U Date: 06/26/2017 Reason for consult: Initial assessment;1st time breastfeeding;Late-preterm 72-36.6wks  G4P2 mother whose infant is now 82 hours old.  Mother did not breastfeed her first child.  This baby is a LPTI at 35 weeks and weight is 7 lbs+10 oz.  Mother required Spanish interpreter and [phone] used throughout the entire visit  Mother has bottle fed twice since birth for a total of 20 mls/feed.  I offered to assist mother with latching infant and she accepted.  Attempted to latch infant onto the right breast in the football hold but infant very sleepy.  She made no attempt to open her mouth even with stimulation.  Infant swaddled with hat on and placed on back in bassinet.    Taught and reviewed STS, breast massage and hand expression (with return demonstration from mother) and feeding 8-12 times/24 hours knowing that this baby may need to be awakened at the three hour interval if she remains sleeping.  Mother will attempt to breastfeed first and then supplement per protocol.    DEBP set up and discussed along with how to clean pump parts.  Mother is using a #27 flange with no pain during pumping.  No colostrum drops noted at this time.  Reinforced that this is okay but to be very diligent about feeding baby and post pumping using hand expression to increase milk volume.  Mother verbalized understanding via interpreter.  Mother is very sleepy and is having a hard time keeping her eyes open.  FOB is sleeping on the couch.  Mom made aware of O/P services, breastfeeding support groups, community resources, and our phone # for post-discharge questions. Mother will call for latch assistance as needed.   Maternal Data Formula Feeding for Exclusion: No Has patient been taught Hand Expression?: Yes Does the patient have breastfeeding experience prior to this delivery?:  No  Feeding Feeding Type: Breast Fed Nipple Type: Slow - flow Length of feed: 0 min  LATCH Score Latch: Too sleepy or reluctant, no latch achieved, no sucking elicited.  Audible Swallowing: None  Type of Nipple: Everted at rest and after stimulation  Comfort (Breast/Nipple): Soft / non-tender  Hold (Positioning): Assistance needed to correctly position infant at breast and maintain latch.  LATCH Score: 5  Interventions Interventions: Breast feeding basics reviewed;Assisted with latch;Breast massage;Hand express;Position options;Support pillows;Adjust position;Breast compression;DEBP  Lactation Tools Discussed/Used Tools: Pump Breast pump type: Double-Electric Breast Pump   Consult Status Consult Status: Follow-up Date: 06/27/17 Follow-up type: In-patient    Edwin Baines R Tattianna Schnarr 06/26/2017, 1:47 AM

## 2017-06-27 ENCOUNTER — Other Ambulatory Visit: Payer: Self-pay

## 2017-06-27 LAB — CULTURE, BETA STREP (GROUP B ONLY)

## 2017-06-27 LAB — GLUCOSE, CAPILLARY: GLUCOSE-CAPILLARY: 110 mg/dL — AB (ref 65–99)

## 2017-06-27 NOTE — Discharge Summary (Signed)
OB Discharge Summary     Patient Name: Julie Brooks DOB: 06-22-1985 MRN: [id number]  Date of admission: 06/24/2017 Delivering MD: Raelyn Mora   Date of discharge: 06/27/2017  Admitting diagnosis: 35 WKS, CTXS, BLEEDING Intrauterine pregnancy: [redacted]w[redacted]d     Secondary diagnosis:  Active Problems:   Preterm labor in third trimester without delivery   Preterm delivery, delivered  Additional problems: Gestational DM     Discharge diagnosis: Preterm Pregnancy Delivered                                                                                                Post partum procedures:none  Augmentation: N/a  Complications: Retained placenta, manually extracted  Hospital course:  Onset of Labor With Vaginal Delivery     32 y.o. yo [id number] at [redacted]w[redacted]d was admitted in Active Labor on 06/24/2017. Patient had an uncomplicated labor course as follows:  Membrane Rupture Time/Date: 9:25 AM ,06/25/2017   Intrapartum Procedures: Episiotomy: None [1]                                         Lacerations:  None [1]  Patient had a delivery of a Viable infant. 06/25/2017  Information for the patient's newborn:  Mas Fallan, Mccarey [[id number]]  Delivery Method: Vaginal, Spontaneous(Filed from Delivery Summary)    Pateint had an uncomplicated postpartum course.  She is ambulating, tolerating a regular diet, passing flatus, and urinating well. Patient is discharged home in stable condition on 06/27/17.   Physical exam  Vitals:   06/25/17 2345 06/26/17 0815 06/26/17 1831 06/27/17 0500  BP: 133/88 113/62 119/82 112/84  Pulse: 79 92 93 77  Resp: Temp: 98.9 F (37.2 C) 98.5 F (36.9 C) 98.1 F (36.7 C) 98.9 F (37.2 C)  TempSrc: Oral Oral Oral Oral  SpO2:    99%   General: alert, cooperative and no distress Lochia: appropriate Uterine Fundus: firm Incision: N/A DVT Evaluation: No evidence of DVT seen on physical exam. Labs: Lab Results  Component Value Date   WBC 18.6  (H) 06/26/2017   HGB 11.7 (L) 06/26/2017   HCT 33.8 (L) 06/26/2017   MCV 77.9 (L) 06/26/2017   PLT 199 06/26/2017   CMP Latest Ref Rng & Units 06/25/2017  Glucose 65 - 99 mg/dL 664(Q)  BUN 6 - 20 mg/dL 8  Creatinine 0.34 - 7.42 mg/dL 5.95  Sodium 638 - 756 mmol/L 136  Potassium 3.5 - 5.1 mmol/L 3.6  Chloride 101 - 111 mmol/L 108  CO2 22 - 32 mmol/L 19(L)  Calcium 8.9 - 10.3 mg/dL 4.3(P)  Total Protein 6.5 - 8.1 g/dL 5.9(L)  Total Bilirubin 0.3 - 1.2 mg/dL 2.9(J)  Alkaline Phos 38 - 126 U/L 122  AST 15 - 41 U/L 25  ALT 14 - 54 U/L 16    Discharge instruction: per After Visit Summary and "Baby and Me Booklet".  After visit meds:  Allergies as of 06/27/2017      Reactions  Azithromycin Swelling   Possible reaction to current medication   Fioricet [butalbital-apap-caffeine] Swelling   Possible reaction to current medication      Medication List    TAKE these medications   aspirin EC 81 MG tablet Take 1 tablet (81 mg total) by mouth daily.   cyclobenzaprine 10 MG tablet Commonly known as:  FLEXERIL Take 1 tablet (10 mg total) by mouth 3 (three) times daily as needed for muscle spasms.   docusate sodium 100 MG capsule Commonly known as:  COLACE Take 1 capsule (100 mg total) by mouth daily.   glyBURIDE 2.5 MG tablet Commonly known as:  DIABETA Take 1 tablet after breakfast and 1.5 tablets at bedtime   hydrocortisone ointment 0.5 % Apply 1 application topically 2 (two) times daily.   NIFEdipine 30 MG 24 hr tablet Commonly known as:  PROCARDIA-XL/ADALAT CC Take 1 tablet (30 mg total) by mouth 2 (two) times daily.   Prenatal Vitamin 27-0.8 MG Tabs Take 1 tablet by mouth daily.   traMADol 50 MG tablet Commonly known as:  ULTRAM Take 1 tablet (50 mg total) by mouth every 6 (six) hours as needed for moderate pain or severe pain.       Diet: routine diet  Activity: Advance as tolerated. Pelvic rest for 6 weeks.   Outpatient follow up:6 weeks Follow up  Appt: Future Appointments  Date Time Provider Department Center  07/31/2017  8:30 AM [facility] NURSE [facility]  07/31/2017 10:55 AM Reva Bores, MD [facility]   Follow up Visit:No follow-ups on file.  Postpartum contraception: Undecided  Newborn Data: Live born female  Birth Weight: 7 lb 10.1 oz (3460 g) APGAR: 9, 9  Newborn Delivery   Birth date/time:  06/25/2017 15:55:00 Delivery type:  Vaginal, Spontaneous     Baby Feeding: Breast Disposition:home with mother   06/27/2017 Felisa Bonier, MD, PGY-1 Family Medicine - [facility] [place]

## 2017-06-27 NOTE — Lactation Note (Signed)
This note was copied from a baby's chart. Lactation Consultation Note  Patient Name: Julie Brooks ZOXWR'U Date: 06/27/2017 Reason for consult: Follow-up assessment   Baby 41 hours old.  35 weeks. Spanish Interpreter [phone].  8.1% Mother has been mostly formula feeding and states baby has been sleepy. Undressed baby to attempt breastfeeding and hand expressed flow. Had mother prepump w/ manual pump. Baby latched briefly and fell back asleep.  Recently had bottle of formula. Encouraged mother to offer breast and then formula. Mom encouraged to feed baby 8-12 times/24 hours and with feeding cues.  Wake baby if she does not feed within 3 hours. Reviewed engorgement care and monitoring voids/stools. Provided manual pump and left message for [facility] to visit for DEBP.    Maternal Data Has patient been taught Hand Expression?: Yes  Feeding Feeding Type: Formula  LATCH Score                   Interventions    Lactation Tools Discussed/Used     Consult Status Consult Status: Follow-up Date: 06/28/17 Follow-up type: In-patient    Dahlia Byes [facility] 06/27/2017, 9:36 AM

## 2017-06-27 NOTE — Progress Notes (Signed)
Post Partum Day #2  Subjective: Julie Brooks is a 32 y.o. [id number] s/p SVD with EBL of 500cc and retained placenta manually extracted. Pain is well controlled.  Plan for birth control is undecided .  Method of Feeding: both no complaints, up ad lib, voiding and tolerating PO  Objective: Blood pressure 112/84, pulse 77, temperature 98.9 F (37.2 C), temperature source Oral, resp. rate 16, last menstrual period 09/27/2016, SpO2 99 %, unknown if currently breastfeeding.  Physical Exam:  General: alert, cooperative, appears stated age and no distress Lochia: appropriate Uterine Fundus: firm Incision: N/A DVT Evaluation: No evidence of DVT seen on physical exam.  Recent Labs    06/25/17 2116 06/26/17 0614  HGB 11.9* 11.7*  HCT 34.6* 33.8*    Assessment/Plan: Discharge home and Breastfeeding  Outpatient f/u in 6 wks   LOS: 3 days   Felisa Bonier, MD, PGY-1 Family Medicine - [facility] [place] 06/27/2017, 7:28 AM

## 2017-06-28 ENCOUNTER — Ambulatory Visit: Payer: Self-pay

## 2017-06-28 NOTE — Lactation Note (Signed)
This note was copied from a baby's chart. Lactation Consultation Note: Infant is 42 hours old and is [redacted] weeks gestation. Infants weight is down 8% this am. Mother reports that infant is hungry all the time.  Mother was attempting to latch infant when I arrived in the room. Mother reports '"no milk". Assist mother with hand expression. Observed small drops of colostrum.  Assist mother with properly latching infant in football and cross cradle hold. Infant tugged a few times on each breast and fell asleep.  Mother advised to post pump with electric pump for 15 mins or use the harmony hand pump for 15 mins on each breast. Mother has last given infant 42 ml of formula with slow flow bottle nipple.  Discussed supply and demand. Mother reports that she has to wash pump parts and then she will pump. Advised mother to pump every 2-3 hours. Discussed good breast massage and treatment and prevention of engorgement. Mother was informed of all available LC services and community support.   Patient Name: Julie Brooks'X Date: 06/28/2017 Reason for consult: Follow-up assessment   Maternal Data    Feeding Feeding Type: Formula Nipple Type: Slow - flow  LATCH Score                   Interventions    Lactation Tools Discussed/Used     Consult Status Consult Status: Follow-up Date: 06/28/17 Follow-up type: In-patient    Stevan Born [facility] 06/28/2017, 11:39 AM

## 2017-06-29 ENCOUNTER — Ambulatory Visit (HOSPITAL_COMMUNITY): Payer: Self-pay

## 2017-06-29 ENCOUNTER — Encounter: Payer: Self-pay | Admitting: Family Medicine

## 2017-06-29 ENCOUNTER — Other Ambulatory Visit: Payer: Self-pay

## 2017-07-04 ENCOUNTER — Encounter: Payer: Self-pay | Admitting: Obstetrics & Gynecology

## 2017-07-04 ENCOUNTER — Other Ambulatory Visit: Payer: Self-pay

## 2017-07-11 ENCOUNTER — Other Ambulatory Visit: Payer: Self-pay

## 2017-07-11 ENCOUNTER — Encounter: Payer: Self-pay | Admitting: Family Medicine

## 2017-07-18 ENCOUNTER — Other Ambulatory Visit: Payer: Self-pay

## 2017-07-18 ENCOUNTER — Encounter: Payer: Self-pay | Admitting: Obstetrics & Gynecology

## 2017-07-25 ENCOUNTER — Encounter: Payer: Self-pay | Admitting: Obstetrics & Gynecology

## 2017-07-25 ENCOUNTER — Other Ambulatory Visit: Payer: Self-pay

## 2017-07-31 ENCOUNTER — Ambulatory Visit: Payer: Self-pay

## 2017-07-31 ENCOUNTER — Ambulatory Visit: Payer: Self-pay | Admitting: Family Medicine

## 2017-08-01 ENCOUNTER — Other Ambulatory Visit: Payer: Self-pay

## 2017-08-01 ENCOUNTER — Encounter: Payer: Self-pay | Admitting: Obstetrics and Gynecology

## 2017-08-09 ENCOUNTER — Ambulatory Visit (INDEPENDENT_AMBULATORY_CARE_PROVIDER_SITE_OTHER): Payer: Self-pay | Admitting: Family Medicine

## 2017-08-09 ENCOUNTER — Ambulatory Visit: Payer: Self-pay | Admitting: *Deleted

## 2017-08-09 ENCOUNTER — Encounter: Payer: Self-pay | Admitting: Family Medicine

## 2017-08-09 VITALS — BP 127/76 | HR 82 | Ht 65.0 in | Wt 189.0 lb

## 2017-08-09 DIAGNOSIS — Z8632 Personal history of gestational diabetes: Secondary | ICD-10-CM

## 2017-08-09 DIAGNOSIS — Z30011 Encounter for initial prescription of contraceptive pills: Secondary | ICD-10-CM

## 2017-08-09 DIAGNOSIS — Z8751 Personal history of pre-term labor: Secondary | ICD-10-CM

## 2017-08-09 MED ORDER — NORETHINDRONE 0.35 MG PO TABS: 1.0000 | ORAL_TABLET | Freq: Every day | ORAL | 11 refills | Status: DC

## 2017-08-09 NOTE — Progress Notes (Signed)
Subjective:     Julie Brooks is a 32 y.o. female who presents for a postpartum visit. She is 6 weeks postpartum following a spontaneous vaginal delivery. I have fully reviewed the prenatal and intrapartum course. The delivery was at 35 gestational weeks. Outcome: spontaneous vaginal delivery. Anesthesia: epidural. Postpartum course has been unremarkable. Baby's course has been unremarkable. Baby is feeding by both breast and bottle - Gerber. Bleeding no bleeding. Bowel function is normal. Bladder function is normal. Patient is not sexually active. Contraception method is none. Patient is interested in the Nexplanon. Postpartum depression screening: negative. I have reviewed the above information  The following portions of the patient's history were reviewed and updated as appropriate: allergies, current medications, past family history, past medical history, past social history, past surgical history and problem list. Nml pap 01/2016 Review of Systems Pertinent items noted in HPI and remainder of comprehensive ROS otherwise negative.   Objective:    BP 127/76   Pulse 82   Ht 5\' 5"  (1.651 m)   Wt 189 lb (85.7 kg)   Breastfeeding? Yes   BMI 31.45 kg/m   General:  alert, cooperative and appears stated age  Lungs: normal effort  Heart:  regular rate and rhythm  Abdomen: soft, non-tender; bowel sounds normal; no masses,  no organomegaly        Assessment:    Nml postpartum exam. Pap smear not done at today's visit.   Plan:    1. Contraception: oral progesterone-only contraceptive [facility] 2. 2 hour today 3. Follow up in: 1 year or as needed.

## 2017-08-09 NOTE — Progress Notes (Signed)
Patient reports constant daily headaches since delivery. Denies blurry vision/dizziness.

## 2017-08-10 LAB — GLUCOSE TOLERANCE, 2 HOURS
GLUCOSE FASTING GTT: 146 mg/dL — AB (ref 65–99)
Glucose, 2 hour: 239 mg/dL — ABNORMAL HIGH (ref 65–139)

## 2017-08-17 ENCOUNTER — Telehealth: Payer: Self-pay

## 2017-08-17 NOTE — Telephone Encounter (Signed)
-----   Message from Reva Boresanya S Pratt, MD sent at 08/10/2017  9:38 AM EDT ----- Clearly remains diabetic postpartum--need PCP referral

## 2017-08-17 NOTE — Telephone Encounter (Signed)
Tried to contact patient regarding her lab results and recommendations. No voicemail.  Left message on alternate number to call office.  Patient needs to see PCP regarding diabetes results.

## 2018-01-18 ENCOUNTER — Other Ambulatory Visit: Payer: Self-pay

## 2018-01-18 ENCOUNTER — Emergency Department (HOSPITAL_COMMUNITY)
Admission: EM | Admit: 2018-01-18 | Discharge: 2018-01-18 | Disposition: A | Discharge status: Home / Self Care | Payer: No Typology Code available for payment source | Attending: Emergency Medicine | Admitting: Emergency Medicine

## 2018-01-18 ENCOUNTER — Encounter (HOSPITAL_COMMUNITY): Payer: Self-pay

## 2018-01-18 ENCOUNTER — Emergency Department (HOSPITAL_COMMUNITY): Payer: No Typology Code available for payment source

## 2018-01-18 DIAGNOSIS — S0990XA Unspecified injury of head, initial encounter: Secondary | ICD-10-CM

## 2018-01-18 DIAGNOSIS — S4992XA Unspecified injury of left shoulder and upper arm, initial encounter: Secondary | ICD-10-CM | POA: Diagnosis present

## 2018-01-18 DIAGNOSIS — Y998 Other external cause status: Secondary | ICD-10-CM | POA: Diagnosis not present

## 2018-01-18 DIAGNOSIS — S40022A Contusion of left upper arm, initial encounter: Secondary | ICD-10-CM | POA: Insufficient documentation

## 2018-01-18 DIAGNOSIS — Y92410 Unspecified street and highway as the place of occurrence of the external cause: Secondary | ICD-10-CM | POA: Insufficient documentation

## 2018-01-18 DIAGNOSIS — Y939 Activity, unspecified: Secondary | ICD-10-CM | POA: Insufficient documentation

## 2018-01-18 MED ORDER — IBUPROFEN 400 MG PO TABS
600.0000 mg | ORAL_TABLET | Freq: Once | ORAL | Status: AC
  Administered 2018-01-18: 600 mg via ORAL
  Filled 2018-01-18: qty 1

## 2018-01-18 MED ORDER — HYDROCODONE-ACETAMINOPHEN 5-325 MG PO TABS
2.0000 | ORAL_TABLET | Freq: Once | ORAL | Status: AC
  Administered 2018-01-18: 2 via ORAL
  Filled 2018-01-18: qty 2
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
## 2018-01-18 NOTE — ED Notes (Signed)
Patient transported to X-ray 
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
## 2018-01-18 NOTE — Discharge Instructions (Signed)
If you develop severe worsening arm pain, weakness or numbness in your arm, severe worsening headache, vomiting, or any other new/concerning symptoms then return to the ER for evaluation.  Otherwise take ibuprofen and/or Tylenol and apply ice to the affected areas.  Si desarrolla un dolor intenso en el brazo que [place], debilidad o entumecimiento en el brazo, dolor de cabeza que empeora severamente, vmitos o cualquier otro sntoma nuevo / preocupante, vuelva a la sala de emergencias para su evaluacin.  De lo contrario, tome ibuprofeno y / o Tylenol y aplique hielo en las reas afectadas.

## 2018-01-18 NOTE — ED Notes (Signed)
Pt thinks she may have swallowed som glass

## 2018-01-18 NOTE — ED Notes (Signed)
Discharge instructions discussed with Pt. Pt verbalized understanding. Pt stable and ambulatory.    

## 2018-03-02 IMAGING — US US MFM OB TRANSVAGINAL
1 series · 14 of 28 positions shown · non-contrast
Comparison: none

[Series 1: us mfm ob transvaginal · 14 of 50 slices shown]
[im 2/50]
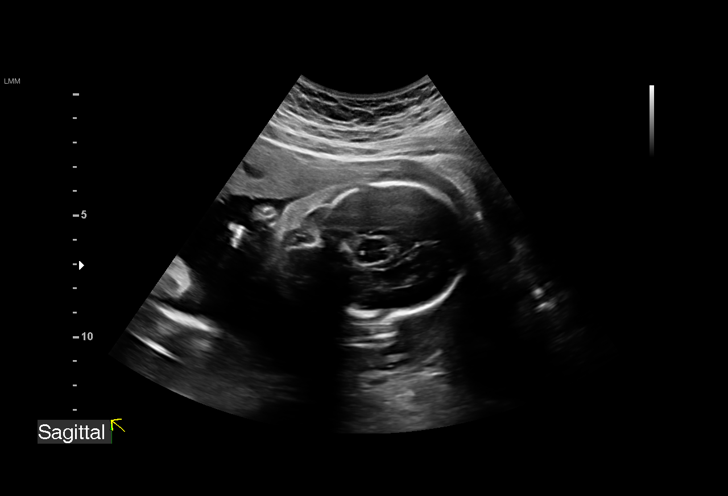
[im 6/50]
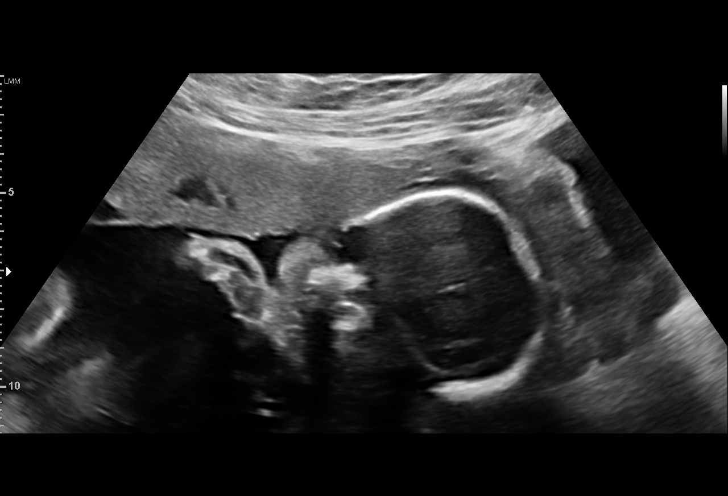
[im 10/50]
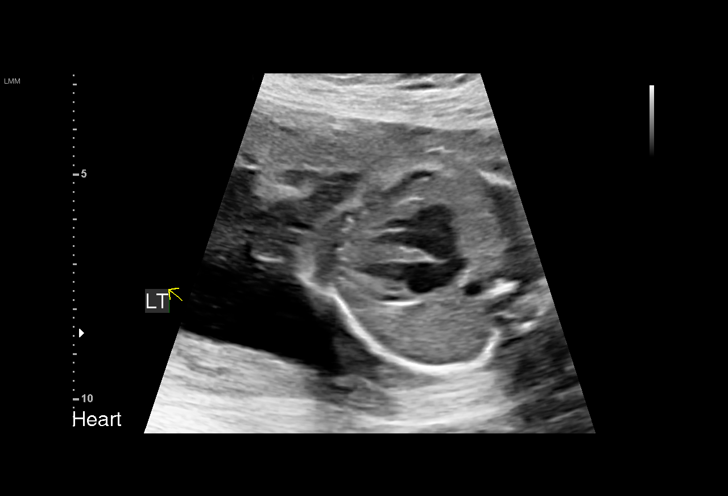
[im 13/50]
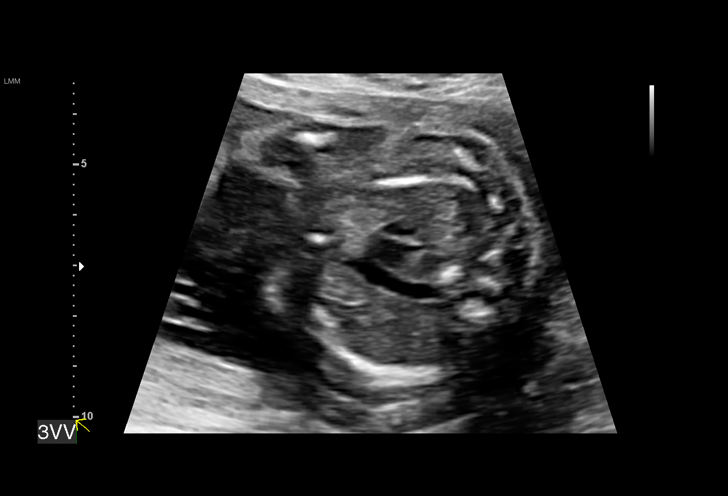
[im 17/50]
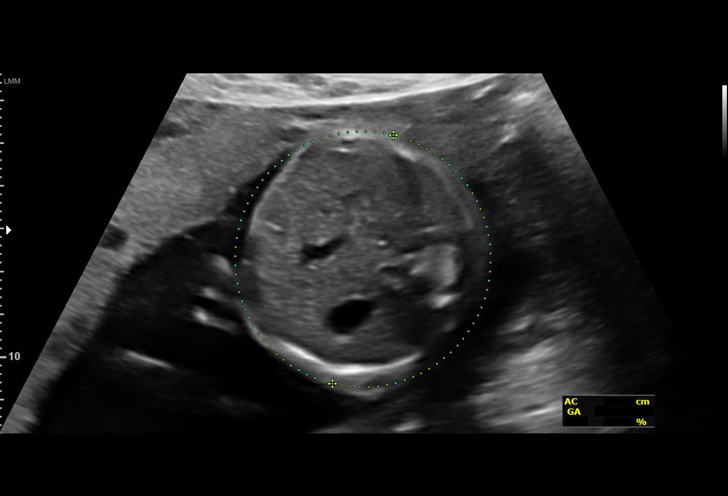
[im 20/50]
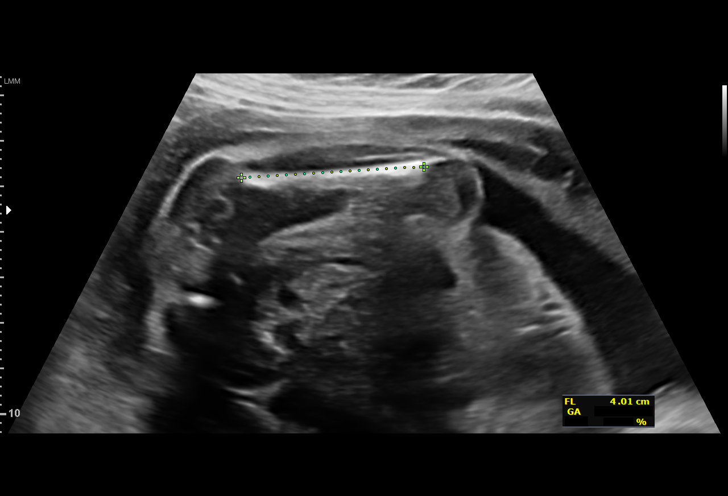
[im 24/50]
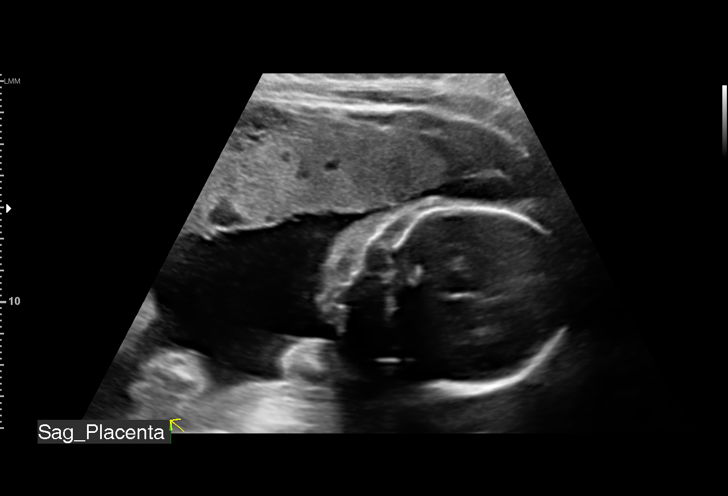
[im 28/50]
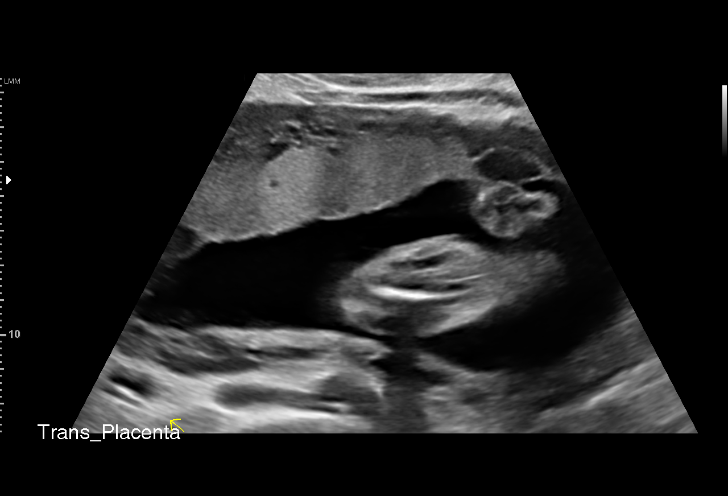
[im 31/50]
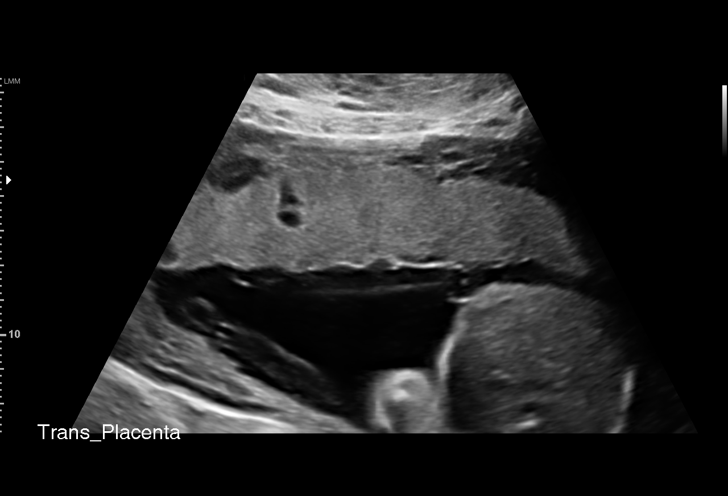
[im 35/50]
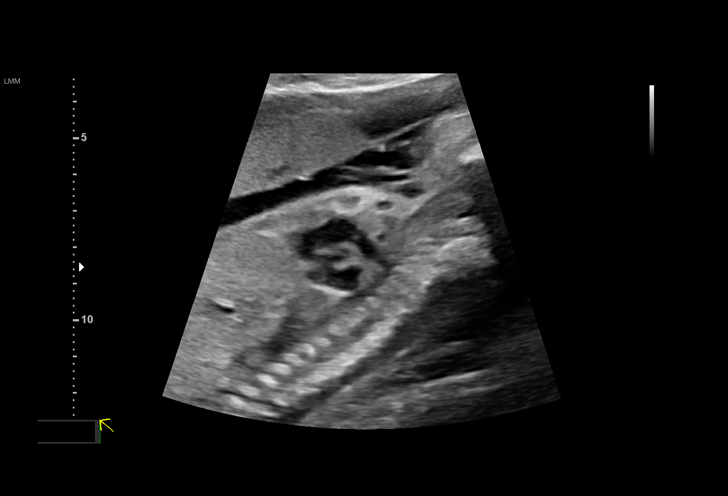
[im 39/50]
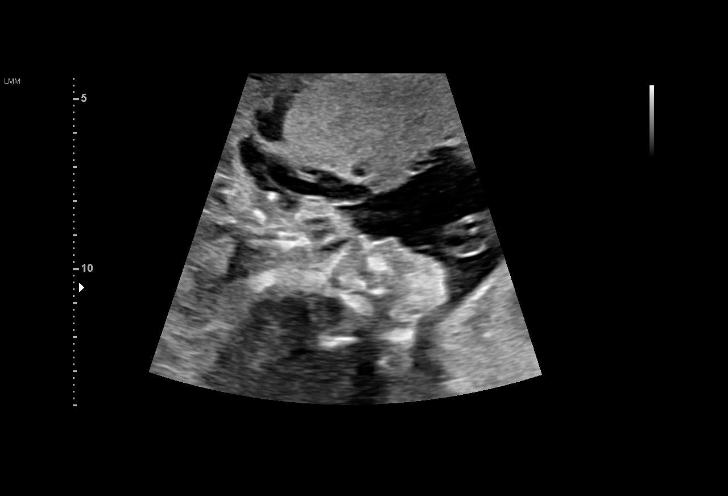
[im 42/50]
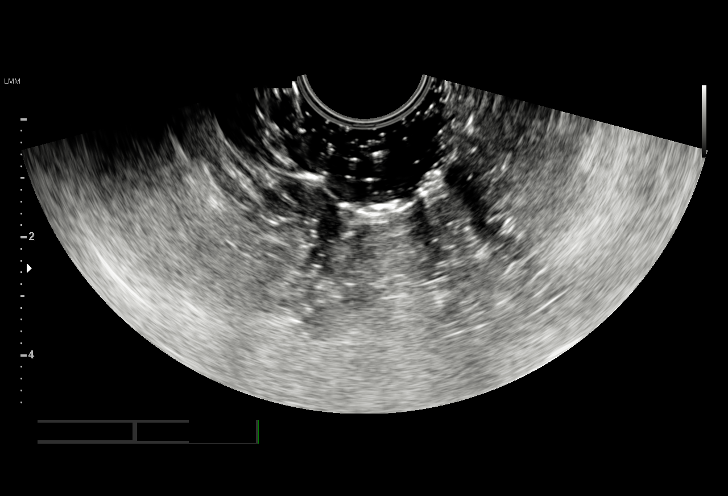
[im 46/50]
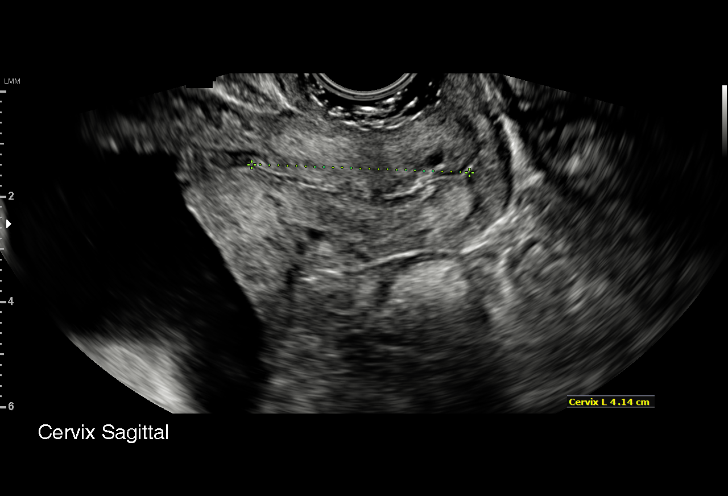
[im 50/50]
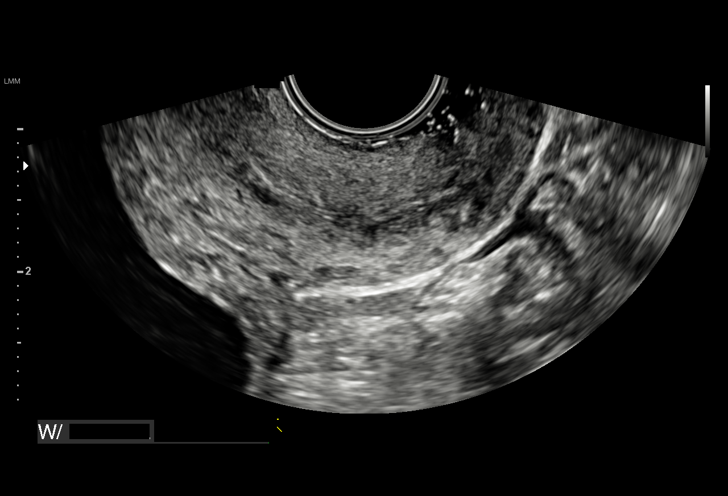

[14 of 28 positions shown; findings below may reference images not displayed]

[REDACTED]-
Faculty Physician

Indications

23 weeks gestation of pregnancy
Poor obstetric history: Previous preterm
delivery, antepartum (PROM at 36 weeks;
17p injections)
Encounter for cervical length
Encounter for other antenatal screening
follow-up
Gestational diabetes in pregnancy,
controlled by oral hypoglycemic drugs;
glyburide
OB History

Blood Type:            Height:  4'11"  Weight (lb):  183       BMI:
Gravidity:    4         Prem:   1         SAB:   2
Living:       1
Fetal Evaluation

Num Of Fetuses:     1
Fetal Heart         144
Rate(bpm):
Cardiac Activity:   Observed
Presentation:       Cephalic
Placenta:           Anterior, above cervical os
P. Cord Insertion:  Previously Visualized
Amniotic Fluid
AFI FV:      Subjectively within normal limits

Largest Pocket(cm)
5.92
Biometry

BPD:      56.9  mm     G. Age:  23w 3d         38  %    CI:        75.86   %    70 - 86
FL/HC:      19.6   %    18.7 -
HC:      207.1  mm     G. Age:  22w 6d         11  %    HC/AC:      1.08        1.05 -
AC:      191.9  mm     G. Age:  23w 6d         52  %    FL/BPD:     71.2   %    71 - 87
FL:       40.5  mm     G. Age:  23w 1d         24  %    FL/AC:      21.1   %    20 - 24

Est. FW:     599  gm      1 lb 5 oz     51  %
Gestational Age

LMP:           27w 2d        Date:  09/27/16                 EDD:   07/04/17
U/S Today:     23w 2d                                        EDD:   08/01/17
Best:          23w 4d     Det. By:  Early Ultrasound         EDD:   07/30/17
(02/16/17)
Anatomy

Cranium:               Appears normal         Aortic Arch:            Appears normal
Cavum:                 Appears normal         Ductal Arch:            Appears normal
Ventricles:            Appears normal         Diaphragm:              Previously seen
Choroid Plexus:        Previously seen        Stomach:                Appears normal, left
sided
Cerebellum:            Previously seen        Abdomen:                Appears normal
Posterior Fossa:       Previously seen        Abdominal Wall:         Previously seen
Nuchal Fold:           Previously seen        Cord Vessels:           Previously seen
Face:                  Orbits and profile     Kidneys:                Appear normal
previously seen
Lips:                  Previously seen        Bladder:                Appears normal
Thoracic:              Appears normal         Spine:                  Previously seen
Heart:                 Appears normal         Upper Extremities:      Previously seen
(4CH, axis, and situs
RVOT:                  Appears normal         Lower Extremities:      Previously seen
LVOT:                  Appears normal

Other:  Parents do not wish to know sex of fetus.
Cervix Uterus Adnexa

Cervix
Length:           4.01  cm.
Normal appearance by transvaginal scan
Impression

SIUP at 23+4 weeks
Normal interval anatomy; anatomic survey complete
Normal amniotic fluid volume
Appropriate interval growth with EFW at the 51st %tile
EV views of cervix: normal length without funneling; mild LUS
contraction

Recommendations

Follow-up ultrasound for growth in 4 weeks
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
## 2019-04-01 ENCOUNTER — Encounter: Payer: Self-pay | Admitting: *Deleted

## 2020-11-21 NOTE — Progress Notes (Signed)
Erroneous encounter

## 2020-11-25 DIAGNOSIS — E781 Pure hyperglyceridemia: Secondary | ICD-10-CM

## 2020-11-25 DIAGNOSIS — M5440 Lumbago with sciatica, unspecified side: Secondary | ICD-10-CM | POA: Insufficient documentation

## 2020-11-25 HISTORY — DX: Pure hyperglyceridemia: E78.1

## 2020-11-26 ENCOUNTER — Ambulatory Visit: Payer: Self-pay | Admitting: Family

## 2020-11-26 ENCOUNTER — Encounter: Payer: Self-pay | Admitting: Family

## 2020-11-26 DIAGNOSIS — Z7689 Persons encountering health services in other specified circumstances: Secondary | ICD-10-CM

## 2020-11-26 DIAGNOSIS — Z789 Other specified health status: Secondary | ICD-10-CM

## 2021-07-21 ENCOUNTER — Ambulatory Visit (INDEPENDENT_AMBULATORY_CARE_PROVIDER_SITE_OTHER): Payer: Managed Care, Other (non HMO) | Admitting: *Deleted

## 2021-07-21 ENCOUNTER — Encounter: Payer: Self-pay | Admitting: Family Medicine

## 2021-07-21 VITALS — BP 114/79 | HR 88 | Ht 61.5 in | Wt 174.8 lb

## 2021-07-21 DIAGNOSIS — Z3687 Encounter for antenatal screening for uncertain dates: Secondary | ICD-10-CM

## 2021-07-21 DIAGNOSIS — O219 Vomiting of pregnancy, unspecified: Secondary | ICD-10-CM

## 2021-07-21 DIAGNOSIS — M5489 Other dorsalgia: Secondary | ICD-10-CM

## 2021-07-21 DIAGNOSIS — Z8751 Personal history of pre-term labor: Secondary | ICD-10-CM

## 2021-07-21 DIAGNOSIS — O0990 Supervision of high risk pregnancy, unspecified, unspecified trimester: Secondary | ICD-10-CM

## 2021-07-21 DIAGNOSIS — O09529 Supervision of elderly multigravida, unspecified trimester: Secondary | ICD-10-CM | POA: Insufficient documentation

## 2021-07-21 DIAGNOSIS — Z603 Acculturation difficulty: Secondary | ICD-10-CM

## 2021-07-21 DIAGNOSIS — O99210 Obesity complicating pregnancy, unspecified trimester: Secondary | ICD-10-CM

## 2021-07-21 DIAGNOSIS — Z8632 Personal history of gestational diabetes: Secondary | ICD-10-CM

## 2021-07-21 DIAGNOSIS — Z789 Other specified health status: Secondary | ICD-10-CM

## 2021-07-21 DIAGNOSIS — M549 Dorsalgia, unspecified: Secondary | ICD-10-CM | POA: Insufficient documentation

## 2021-07-21 MED ORDER — PROMETHAZINE HCL 25 MG PO TABS: 25.0000 mg | ORAL_TABLET | Freq: Four times a day (QID) | ORAL | 1 refills | Status: DC | PRN

## 2021-07-21 MED ORDER — PRENATAL VITAMIN 27-0.8 MG PO TABS: 1.0000 | ORAL_TABLET | Freq: Every day | ORAL | 11 refills | Status: AC

## 2021-07-21 NOTE — Patient Instructions (Signed)
  At our [facility], we work as an integrated team, providing care to address both physical and emotional health. Your medical provider may refer you to see our Behavioral Health Clinician (BHC) on the same day you see your medical provider, as availability permits; often scheduled virtually at your convenience.  Our BHC is available to all patients, visits generally last between 20-30 minutes, but can be longer or shorter, depending on patient need. The BHC offers help with stress management, coping with symptoms of depression and anxiety, major life changes , sleep issues, changing risky behavior, grief and loss, life stress, working on personal life goals, and  behavioral health issues, as these all affect your overall health and wellness.  The BHC is NOT available for the following: FMLA paperwork, court-ordered evaluations, specialty assessments (custody or disability), letters to employers, or obtaining certification for an emotional support animal. The BHC does not provide long-term therapy. You have the right to refuse integrated behavioral health services, or to reschedule to see the BHC at a later date.  Confidentiality exception: If it is suspected that a child or disabled adult is being abused or neglected, we are required by law to report that to either Child Protective Services or Adult Protective Services.  If you have a diagnosis of Bipolar affective disorder, Schizophrenia, or recurrent Major depressive disorder, we will recommend that you establish care with a psychiatrist, as these are lifelong, chronic conditions, and we want your overall emotional health and medications to be more closely monitored. If you anticipate needing extended maternity leave due to mental health issues postpartum, it it recommended you inform your medical provider, so we can put in a referral to a psychiatrist as soon as possible. The BHC is unable to recommend an extended maternity leave for mental  health issues. Your medical provider or BHC may refer you to a therapist for ongoing, traditional therapy, or to a psychiatrist, for medication management, if it would benefit your overall health. Depending on your insurance, you may have a copay or be charged a deductible, depending on your insurance, to see the BHC. If you are uninsured, it is recommended that you apply for financial assistance. (Forms may be requested at the front desk for in-person visits, via MyChart, or request a form during a virtual visit).  If you see the BHC more than 6 times, you will have to complete a comprehensive clinical assessment interview with the BHC to resume integrated services.  For virtual visits with the BHC, you must be physically in the state of [place] at the time of the visit. For example, if you live in [place], you will have to do an in-person visit with the BHC, and your out-of-state insurance may not cover behavioral health services in [place]. If you are going out of the state or country for any reason, the BHC may see you virtually when you return to [place], but not while you are physically outside of [place].    [email or web address] is a website with information to help you prepare for Labor and delivery, patient information, visitor information and more.    Here is a link to the Pregnancy Navigators . Please Fill out prior to your New OB appointment.   English Link: [email or web address]  Spanish Link: [email or web address]  

## 2021-07-21 NOTE — Addendum Note (Signed)
Addended by: Samuel Germany on: 07/21/2021 05:25 PM   Modules accepted: Orders

## 2021-07-21 NOTE — Progress Notes (Signed)
New OB Intake  Patient came to office for new ob intake.  I explained I am completing New OB Intake today. We discussed her EDD is undertermined due to she confirms LMP 05/23/21 which only lasted 3 days  and was very light and different than her usual which is  5 days. I advised dating [place] which she agreed to . Dating [place] scheduled for 08/02/21.  Pt is G5/P0222. I reviewed her allergies, medications, Medical/Surgical/OB history, and appropriate screenings. I informed her of [facility] services. She did ask for nausea medication and PNV. RX for Phenergan and PNV sent to pharmacy. Based on history, this is a/an  pregnancy complicated by PTD ,AMA .   Patient Active Problem List   Diagnosis Date Noted   Supervision of high risk pregnancy, antepartum 07/21/2021   AMA (advanced maternal age) multigravida 35+ 07/21/2021   Language barrier 02/22/2017   History of preterm delivery 02/02/2017   History of gestational diabetes 02/02/2017    Concerns addressed today  Delivery Plans:  Plans to deliver at [facility] [place].   MyChart/Babyscripts MyChart access verified. I explained pt will have some visits in office and some virtually.   Blood Pressure Cuff  Patient has private insurance; instructed to purchase blood pressure cuff and bring to first prenatal appt.   Weight scale: Patient does not  have weight scale. She said she can buy one.    Anatomy US Explained first scheduled [place] will be around 19 weeks. And will be scheduled after dating [place].    Labs Discussed Johnsie Cancel genetic screening with patient. Would like both Panorama and Horizon drawn at new OB visit.Also if interested in genetic testing, tell patient she will need AFP 15-21 weeks to complete genetic testing .Routine prenatal labs drawn today.  Covid Vaccine Patient has had covid vaccine.   Is patient a CenteringPregnancy candidate?  Declined due to Childcare   Is patient a Mom+Baby Combined Care candidate?  Not a candidate      Is patient  interested in [place]?  Yes   "Interested in [employer] - Schedule next visit with CNM" on sticky note  Informed patient of [facility] website  and placed link in her AVS.   Social Determinants of Health Food Insecurity: Patient denies food insecurity. WIC Referral: Patient is not interested in referral to [facility].  Transportation: Patient denies transportation needs. Childcare: Discussed no children allowed at ultrasound appointments. Offered childcare services; patient declines childcare services at this time.  Send link to Pregnancy Navigators   Placed OB Box on problem list and updated  First visit review I reviewed new OB appt with pt. I explained she will have a pelvic exam, additional bloodwork if needed with genetic screening, and PAP smear. Explained pt will be seen by Dr. Cy Blamer at first visit; encounter routed to appropriate provider. Explained that patient will be seen by pregnancy navigator following visit with provider. [facility] information placed in AVS.   Staci Acosta 07/21/2021  2:05 PM

## 2021-07-22 LAB — CBC/D/PLT+RPR+RH+ABO+RUBIGG...
Antibody Screen: NEGATIVE
Basophils Absolute: 0.1 10*3/uL (ref 0.0–0.2)
Basos: 1 %
EOS (ABSOLUTE): 0.4 10*3/uL (ref 0.0–0.4)
Eos: 3 %
HCV Ab: NONREACTIVE
HIV Screen 4th Generation wRfx: NONREACTIVE
Hematocrit: 38.6 % (ref 34.0–46.6)
Hemoglobin: 13.3 g/dL (ref 11.1–15.9)
Hepatitis B Surface Ag: NEGATIVE
Immature Grans (Abs): 0 10*3/uL (ref 0.0–0.1)
Immature Granulocytes: 0 %
Lymphocytes Absolute: 2.4 10*3/uL (ref 0.7–3.1)
Lymphs: 21 %
MCH: 29.6 pg (ref 26.6–33.0)
MCHC: 34.5 g/dL (ref 31.5–35.7)
MCV: 86 fL (ref 79–97)
Monocytes Absolute: 0.7 10*3/uL (ref 0.1–0.9)
Monocytes: 6 %
Neutrophils Absolute: 8 10*3/uL — ABNORMAL HIGH (ref 1.4–7.0)
Neutrophils: 69 %
Platelets: 257 10*3/uL (ref 150–450)
RBC: 4.5 x10E6/uL (ref 3.77–5.28)
RDW: 12.8 % (ref 11.7–15.4)
RPR Ser Ql: NONREACTIVE
Rh Factor: POSITIVE
Rubella Antibodies, IGG: 2.04 index (ref >0.99)
WBC: 11.6 10*3/uL — ABNORMAL HIGH (ref 3.4–10.8)

## 2021-07-22 LAB — HEMOGLOBIN A1C
Est. average glucose Bld gHb Est-mCnc: 249 mg/dL
Hgb A1c MFr Bld: 10.3 % — ABNORMAL HIGH (ref 4.8–5.6)

## 2021-07-22 LAB — HCV INTERPRETATION

## 2021-07-23 LAB — URINE CULTURE, OB REFLEX

## 2021-07-23 LAB — CULTURE, OB URINE

## 2021-07-28 ENCOUNTER — Telehealth: Payer: Self-pay

## 2021-07-28 NOTE — Telephone Encounter (Addendum)
-----   Message from Warner Mccreedy, MD sent at 07/28/2021  6:55 AM EDT ----- Regarding: Request to schedule with diabetes educator/coordinator Hi This patient looks like she likely has type 2 diabetes (a1c on initial labs is 10.3). Could someone call and discuss with her and also also let her know she should be connected to the diabetes educator soon. Additionally, if possible her initial prenatal visit (with a provider) should be moved up from 6/28 to sometime the week before (but after 6/13 when she has her [place]).   Thanks! Dr. Rondall Allegra pt with Spanish Interpreter Eda R., verified pt if she has ever been informed that she has been dx with Type 2 diabetes.  Pt states that she has never been informed.  I informed pt that her A1C is elevated and the provider would her to see diabetes education.   Pt scheduled for diabetes education on 08/02/21 @ 0915 after U/S at 0830 at [facility].  Pt also informed that we have moved up her OB appt to 08/04/21 instead of 08/12/21.  Pt verbalized understanding with no further questions.   Leonette Nutting  07/28/21

## 2021-08-01 ENCOUNTER — Other Ambulatory Visit: Payer: Self-pay | Admitting: *Deleted

## 2021-08-01 DIAGNOSIS — O24319 Unspecified pre-existing diabetes mellitus in pregnancy, unspecified trimester: Secondary | ICD-10-CM

## 2021-08-01 DIAGNOSIS — R7309 Other abnormal glucose: Secondary | ICD-10-CM

## 2021-08-02 ENCOUNTER — Other Ambulatory Visit: Payer: Managed Care, Other (non HMO)

## 2021-08-02 ENCOUNTER — Ambulatory Visit (INDEPENDENT_AMBULATORY_CARE_PROVIDER_SITE_OTHER): Payer: Managed Care, Other (non HMO) | Admitting: Registered"

## 2021-08-02 ENCOUNTER — Encounter: Payer: Managed Care, Other (non HMO) | Attending: Family Medicine | Admitting: Registered"

## 2021-08-02 ENCOUNTER — Telehealth: Payer: Self-pay | Admitting: Registered"

## 2021-08-02 ENCOUNTER — Other Ambulatory Visit: Payer: Self-pay | Admitting: Family Medicine

## 2021-08-02 DIAGNOSIS — O09511 Supervision of elderly primigravida, first trimester: Secondary | ICD-10-CM | POA: Insufficient documentation

## 2021-08-02 DIAGNOSIS — O24311 Unspecified pre-existing diabetes mellitus in pregnancy, first trimester: Secondary | ICD-10-CM | POA: Diagnosis present

## 2021-08-02 DIAGNOSIS — Z713 Dietary counseling and surveillance: Secondary | ICD-10-CM | POA: Diagnosis not present

## 2021-08-02 DIAGNOSIS — O24111 Pre-existing diabetes mellitus, type 2, in pregnancy, first trimester: Secondary | ICD-10-CM

## 2021-08-02 DIAGNOSIS — O24113 Pre-existing diabetes mellitus, type 2, in pregnancy, third trimester: Secondary | ICD-10-CM | POA: Insufficient documentation

## 2021-08-02 DIAGNOSIS — Z3A00 Weeks of gestation of pregnancy not specified: Secondary | ICD-10-CM | POA: Insufficient documentation

## 2021-08-02 MED ORDER — METFORMIN HCL 1000 MG PO TABS: 1000.0000 mg | ORAL_TABLET | Freq: Two times a day (BID) | ORAL | 11 refills | Status: DC

## 2021-08-02 MED ORDER — INSULIN NPH (HUMAN) (ISOPHANE) 100 UNIT/ML ~~LOC~~ SUSP: 10.0000 [IU] | Freq: Two times a day (BID) | SUBCUTANEOUS | 3 refills | Status: DC

## 2021-08-02 MED ORDER — "INSULIN SYRINGE 31G X 5/16"" 0.5 ML MISC": 1.0000 | Freq: Two times a day (BID) | 11 refills | Status: DC

## 2021-08-02 NOTE — Addendum Note (Signed)
Addended by: Heywood Bene D on: 08/02/2021 11:36 AM   Modules accepted: Level of Service

## 2021-08-02 NOTE — Progress Notes (Signed)
New diagnosis of type 2 DM, rx sent for metformin 1g BID and NPH 10u BID for time being as we do not have accurate dating yet to weight base the patient.

## 2021-08-02 NOTE — Progress Notes (Signed)
AMN video interpreter Victorio Palm [phone]  Patient was seen for T2D in pregancy self-management on 08/02/2021  Start time 0940 and End time 1016   Estimated due date: n/a; having ultra sound today   Clinical: Medications: prenatal, phenergan Medical History: GDM 2019 Labs: OGTT n/a, A1c 10.3%   Dietary and Lifestyle History: Pt states she was taking metformin but stopped before becoming pregnant because it was giving her headaches.  Physical Activity: not assessed Stress: not assessed Sleep: 5-6 hrs on work days; 10-12 hrs on days off  24 hr Recall: (not complete) First Meal: Snack: Second meal: Snack: Third meal: 2 c pasta with sausage Snack: Beverages: mostly water, sometimes juice or gatorade  NUTRITION INTERVENTION  Nutrition education (E-1) on the following topics:   Initial Follow-up  [x]  []  Definition of Gestational Diabetes [x]  []  Why dietary management is important in controlling blood glucose [x]  []  Effects each nutrient has on blood glucose levels []  []  Simple carbohydrates vs complex carbohydrates []  []  Fluid intake []  []  Creating a balanced meal plan [x]  []  Carbohydrate counting  [x]  []  When to check blood glucose levels [x]  []  Proper blood glucose monitoring techniques []  []  Effect of stress and stress reduction techniques  []  []  Exercise effect on blood glucose levels, appropriate exercise during pregnancy []  []  Importance of limiting caffeine and abstaining from alcohol and smoking [x]  []  Medications used for blood sugar control during pregnancy []  []  Hypoglycemia and rule of 15 []  []  Postpartum self care  Blood glucose monitor given: Prodigy Lot # CBG: 247 mg/dL  Patient instructed to monitor glucose levels: FBS: 60 - ? 95 mg/dL (some clinics use 90 for cutoff) 1 hour: ? 140 mg/dL 2 hour: ? mg/dL  Patient received handouts: Nutrition Diabetes and Pregnancy Carbohydrate Counting List  Patient will be seen for follow-up as  needed.

## 2021-08-02 NOTE — Telephone Encounter (Signed)
Placed call to patient with in-person interpreter [facility]  Patient was instructed to pick up metformin, syringes, and NPH insulin and bring to her appointment on 6/15 for insulin instruction. If insulin is too expensive with insurance co-pay she was instructed to go to [facility] to get NPH insulin for $25.  Patient voiced understanding and did not have any further questions.

## 2021-08-04 ENCOUNTER — Encounter: Payer: Self-pay | Admitting: Medical

## 2021-08-04 ENCOUNTER — Ambulatory Visit (INDEPENDENT_AMBULATORY_CARE_PROVIDER_SITE_OTHER): Payer: Managed Care, Other (non HMO) | Admitting: Registered"

## 2021-08-04 ENCOUNTER — Other Ambulatory Visit (HOSPITAL_COMMUNITY)
Admission: RE | Admit: 2021-08-04 | Discharge: 2021-08-04 | Disposition: A | Discharge status: Home / Self Care | Payer: Managed Care, Other (non HMO) | Source: Ambulatory Visit | Attending: Family Medicine | Admitting: Family Medicine

## 2021-08-04 ENCOUNTER — Ambulatory Visit (INDEPENDENT_AMBULATORY_CARE_PROVIDER_SITE_OTHER): Payer: Managed Care, Other (non HMO) | Admitting: Medical

## 2021-08-04 VITALS — BP 120/69 | HR 89 | Wt 174.6 lb

## 2021-08-04 DIAGNOSIS — O0990 Supervision of high risk pregnancy, unspecified, unspecified trimester: Secondary | ICD-10-CM | POA: Diagnosis not present

## 2021-08-04 DIAGNOSIS — Z3A10 10 weeks gestation of pregnancy: Secondary | ICD-10-CM

## 2021-08-04 DIAGNOSIS — O24111 Pre-existing diabetes mellitus, type 2, in pregnancy, first trimester: Secondary | ICD-10-CM

## 2021-08-04 DIAGNOSIS — O99211 Obesity complicating pregnancy, first trimester: Secondary | ICD-10-CM

## 2021-08-04 DIAGNOSIS — Z8632 Personal history of gestational diabetes: Secondary | ICD-10-CM

## 2021-08-04 DIAGNOSIS — Z8751 Personal history of pre-term labor: Secondary | ICD-10-CM

## 2021-08-04 DIAGNOSIS — O09521 Supervision of elderly multigravida, first trimester: Secondary | ICD-10-CM

## 2021-08-04 DIAGNOSIS — Z789 Other specified health status: Secondary | ICD-10-CM

## 2021-08-04 MED ORDER — ASPIRIN 81 MG PO TBEC: 81.0000 mg | DELAYED_RELEASE_TABLET | Freq: Every day | ORAL | 12 refills | Status: DC

## 2021-08-04 NOTE — Progress Notes (Signed)
AMN video interpreter Kendell Bane [phone]  Insulin Instruction  Patient was seen on 08/04/2021 for insulin instruction.  MD orders are:  NPH: 10 units am 10 units pm Metformin 1000 mg bid  The following learning objectives were met by the patient during this visit:   Insulin Action of NPH & R insulins  Reviewed syringe & vial including # units per syringe and vial  Hygiene and storage  Drawing up single doses if using vials  Rotation of Sites  Hypoglycemia- symptoms, causes, treatment choices  Record keeping and MD follow up  Patient demonstrated understanding of insulin administration by return demonstration.  Patient states she is having a lot of heartburn and medication is helping somewhat but feeling bad.  Patient received the following handouts: Insulin Instruction Handout                                        Patient to start on insulin as Rx'd by MD  Patient will be seen for follow-up in 1 weeks or as needed.

## 2021-08-04 NOTE — Progress Notes (Signed)
PRENATAL VISIT NOTE  Subjective:  Julie Brooks is a 36 y.o. [id number] at 93w3dbeing seen today for her first prenatal visit for this pregnancy.  She is currently monitored for the following issues for this high-risk pregnancy and has History of preterm delivery; History of gestational diabetes; Language barrier; Supervision of high risk pregnancy, antepartum; AMA (advanced maternal age) multigravida 35+; Obesity affecting pregnancy; Back pain; and Pre-existing type 2 diabetes mellitus during pregnancy in first trimester on their problem list.  Patient reports no complaints.  Contractions: Not present. Vag. Bleeding: None.  Movement: Absent. Denies leaking of fluid.   She is planning to bottle feed. Desires partner vasectomy for contraception.   The following portions of the patient's history were reviewed and updated as appropriate: allergies, current medications, past family history, past medical history, past social history, past surgical history and problem list.   Objective:   Vitals:   08/04/21 0920  BP: 120/69  Pulse: 89  Weight: 174 lb 9.6 oz (79.2 kg)    Fetal Status: Fetal Heart Rate (bpm): [place]  Movement: Absent     General:  Alert, oriented and cooperative. Patient is in no acute distress.  Skin: Skin is warm and dry. No rash noted.   Cardiovascular: Normal heart rate and rhythm noted  Respiratory: Normal respiratory effort, no problems with respiration noted. Clear to auscultation.   Abdomen: Soft, gravid, appropriate for gestational age. Normal bowel sounds. Non-tender. Pain/Pressure: Present     Pelvic: Cervical exam performed       Normal cervical contour, no lesions, no bleeding following pap, normal discharge  Extremities: Normal range of motion.  Edema: None  Mental Status: Normal mood and affect. Normal behavior. Normal judgment and thought content.    Indications for ASA therapy (per uptodate) One of the following: Previous pregnancy with preeclampsia,  especially early onset and with an adverse outcome No Multifetal gestation No Chronic hypertension No Type 1 or 2 diabetes mellitus Yes Chronic kidney disease No Autoimmune disease (antiphospholipid syndrome, systemic lupus erythematosus) No  Pt informed that the ultrasound is considered a limited OB ultrasound and is not intended to be a complete ultrasound exam.  Patient also informed that the ultrasound is not being completed with the intent of assessing for fetal or placental anomalies or any pelvic abnormalities.  Explained that the purpose of today's ultrasound is to assess for  viability.  Patient acknowledges the purpose of the exam and the limitations of the study.  +FM and +FHR noted, unable to obtain FHR value on POCUS, has dating/viability scan on 08/09/21    Assessment and Plan:  Pregnancy: [id number] 169w3d. Supervision of high risk pregnancy, antepartum - New OB panel and other screening labs drawn at intake visit  - Protein / creatinine ratio, urine - TSH - Cytology - PAP( [place]) - Will defer NIPS to next visit due to unsure GA and BMI  2. Pre-existing type 2 diabetes mellitus during pregnancy in first trimester - aspirin EC 81 MG tablet; Take 1 tablet (81 mg total) by mouth daily. Swallow whole.  Dispense: 30 tablet; Refill: 12 - advised to start at 12 weeks  - Comp Met (CMET) - Started on insulin today, will pick up as prescribed by Dr. EcDione Ploverast week, had Diabetes education today  - Also on Metformin  - Antenatal testing, growth USKoreand delivery timing discussed  3. Multigravida of advanced maternal age in first trimester  4. Obesity affecting pregnancy in first trimester -  aspirin EC 81 MG tablet; Take 1 tablet (81 mg total) by mouth daily. Swallow whole.  Dispense: 30 tablet; Refill: 12  5. History of preterm delivery - at 35 and 36 weeks, SOL  6. History of gestational diabetes  7. Language barrier - Interpreter used on iPad today   8. [redacted] weeks  gestation of pregnancy  Preterm labor/first trimester warning symptoms and general obstetric precautions including but not limited to vaginal bleeding, contractions, leaking of fluid and fetal movement were reviewed in detail with the patient. Please refer to After Visit Summary for other counseling recommendations.   Discussed the normal visit cadence for prenatal care Discussed the nature of our practice with multiple providers including residents and students   Return in about 4 weeks (around 09/01/2021) for [facility] MD only, In-Person.  Future Appointments  Date Time Provider [place]  08/09/2021 10:30 AM [facility]  08/11/2021  9:15 AM [facility]  09/02/2021  9:15 AM Aletha Halim, MD [facility]    Kerry Hough, PA-C

## 2021-08-05 ENCOUNTER — Telehealth: Payer: Self-pay | Admitting: Family Medicine

## 2021-08-05 LAB — COMPREHENSIVE METABOLIC PANEL
ALT: 14 IU/L (ref 0–32)
AST: 11 IU/L (ref 0–40)
Albumin/Globulin Ratio: 1.5 (ref 1.2–2.2)
Albumin: 4 g/dL (ref 3.8–4.8)
Alkaline Phosphatase: 66 IU/L (ref 44–121)
BUN/Creatinine Ratio: 13 (ref 9–23)
BUN: 7 mg/dL (ref 6–20)
Bilirubin Total: 0.2 mg/dL (ref 0.0–1.2)
CO2: 20 mmol/L (ref 20–29)
Calcium: 9.9 mg/dL (ref 8.7–10.2)
Chloride: 101 mmol/L (ref 96–106)
Creatinine, Ser: 0.53 mg/dL — ABNORMAL LOW (ref 0.57–1.00)
Globulin, Total: 2.6 g/dL (ref 1.5–4.5)
Glucose: 229 mg/dL — ABNORMAL HIGH (ref 70–99)
Potassium: 3.8 mmol/L (ref 3.5–5.2)
Sodium: 136 mmol/L (ref 134–144)
Total Protein: 6.6 g/dL (ref 6.0–8.5)
eGFR: 121 mL/min/{1.73_m2} (ref >59)

## 2021-08-05 LAB — PROTEIN / CREATININE RATIO, URINE
Creatinine, Urine: 35.3 mg/dL
Protein, Ur: <4 mg/dL

## 2021-08-05 LAB — TSH: TSH: 2.29 u[IU]/mL (ref 0.450–4.500)

## 2021-08-05 NOTE — Telephone Encounter (Signed)
Patient called in stating she is confused by her insulin prescription because it is not the correct dose she is supposed to be receiving and wants to speak with a nurse. Patient speaks spanish so an interpreter was used for the call.

## 2021-08-05 NOTE — Telephone Encounter (Signed)
Called pt w/[employer] interpreter [phone].  Pt stated that she did not remember what dose of insulin she should take. I instructed that she hsould take 10 units @ breakfast and 10 units @ dinner.  Pt voiced understanding.

## 2021-08-08 LAB — CYTOLOGY - PAP
Chlamydia: NEGATIVE
Comment: NEGATIVE
Comment: NEGATIVE
Comment: NORMAL
Diagnosis: NEGATIVE
High risk HPV: NEGATIVE
Neisseria Gonorrhea: NEGATIVE

## 2021-08-09 ENCOUNTER — Other Ambulatory Visit: Payer: Self-pay | Admitting: Obstetrics and Gynecology

## 2021-08-09 ENCOUNTER — Ambulatory Visit (INDEPENDENT_AMBULATORY_CARE_PROVIDER_SITE_OTHER): Payer: Managed Care, Other (non HMO) | Admitting: Advanced Practice Midwife

## 2021-08-09 ENCOUNTER — Ambulatory Visit (INDEPENDENT_AMBULATORY_CARE_PROVIDER_SITE_OTHER): Payer: Managed Care, Other (non HMO)

## 2021-08-09 ENCOUNTER — Other Ambulatory Visit: Payer: Managed Care, Other (non HMO) | Admitting: Advanced Practice Midwife

## 2021-08-09 DIAGNOSIS — O3680X0 Pregnancy with inconclusive fetal viability, not applicable or unspecified: Secondary | ICD-10-CM

## 2021-08-09 DIAGNOSIS — Z3490 Encounter for supervision of normal pregnancy, unspecified, unspecified trimester: Secondary | ICD-10-CM

## 2021-08-09 DIAGNOSIS — Z3A11 11 weeks gestation of pregnancy: Secondary | ICD-10-CM | POA: Diagnosis not present

## 2021-08-09 DIAGNOSIS — O24911 Unspecified diabetes mellitus in pregnancy, first trimester: Secondary | ICD-10-CM

## 2021-08-09 DIAGNOSIS — Z3491 Encounter for supervision of normal pregnancy, unspecified, first trimester: Secondary | ICD-10-CM | POA: Diagnosis not present

## 2021-08-09 DIAGNOSIS — Z3687 Encounter for antenatal screening for uncertain dates: Secondary | ICD-10-CM | POA: Diagnosis not present

## 2021-08-09 DIAGNOSIS — O09521 Supervision of elderly multigravida, first trimester: Secondary | ICD-10-CM

## 2021-08-09 NOTE — Progress Notes (Signed)
Ultrasounds Results Note  SUBJECTIVE HPI:  [name] Julie Brooks is a 36 y.o. [id number] at [redacted]w[redacted]d by LMP who presents to [facility] for followup ultrasound results. The patient denies abdominal pain or vaginal bleeding.  Informal POCUS performed 08/04/21. FP and + FHR visualized but unable to measure value. Repeat ultrasound was performed earlier today.   Type 2 DM on Insulin. Fasting CBG this morning 98. Postprandial CBGs elevated.   Past Medical History:  Diagnosis Date   Back pain    Gestational diabetes    Past Surgical History:  Procedure Laterality Date   APPENDECTOMY     DILATION AND CURETTAGE OF UTERUS  2004   Social History   Socioeconomic History   Marital status: Single    Spouse name: Not on file   Number of children: Not on file   Years of education: Not on file   Highest education level: Not on file  Occupational History   Not on file  Tobacco Use   Smoking status: Never   Smokeless tobacco: Never  Vaping Use   Vaping Use: Never used  Substance and Sexual Activity   Alcohol use: Not Currently    Comment: socially , stopped when found out pregnant   Drug use: No   Sexual activity: Yes    Birth control/protection: None    Comment: nexplanon removed in December 2022  Other Topics Concern   Not on file  Social History Narrative   Not on file   Social Determinants of Health   Financial Resource Strain: Not on file  Food Insecurity: No Food Insecurity (07/21/2021)   Hunger Vital Sign    Worried About Running Out of Food in the Last Year: Never true    Ran Out of Food in the Last Year: Never true  Transportation Needs: No Transportation Needs (07/21/2021)   PRAPARE - Administrator, Civil Service (Medical): No    Lack of Transportation (Non-Medical): No  Physical Activity: Not on file  Stress: Not on file  Social Connections: Not on file  Intimate Partner Violence: Not At Risk (07/21/2021)   Humiliation, Afraid, Rape, and Kick  questionnaire    Fear of Current or Ex-Partner: No    Emotionally Abused: No    Physically Abused: No    Sexually Abused: No   Current Outpatient Medications on File Prior to Visit  Medication Sig Dispense Refill   aspirin EC 81 MG tablet Take 1 tablet (81 mg total) by mouth daily. Swallow whole. 30 tablet 12   insulin NPH Human (NOVOLIN N) 100 UNIT/ML injection Inject 0.1 mLs (10 Units total) into the skin 2 (two) times daily before a meal. 10 mL 3   Insulin Syringe-Needle U-100 (INSULIN SYRINGE .5CC/31GX5/16") 31G X 5/16" 0.5 ML MISC 1 Syringe by Does not apply route 2 (two) times daily. 100 each 11   metFORMIN (GLUCOPHAGE) 1000 MG tablet Take 1 tablet (1,000 mg total) by mouth 2 (two) times daily with a meal. 60 tablet 11   norethindrone (MICRONOR,CAMILA,ERRIN) 0.35 MG tablet Take 1 tablet (0.35 mg total) by mouth daily. 1 Package 11   Prenatal Vit-Fe Fumarate-FA (PRENATAL VITAMIN PO) Take 1 tablet by mouth daily. (Patient not taking: Reported on 08/04/2021)     Prenatal Vit-Fe Fumarate-FA (PRENATAL VITAMIN) 27-0.8 MG TABS Take 1 tablet by mouth daily. (Patient not taking: Reported on 08/04/2021) 30 tablet 6   Prenatal Vit-Fe Fumarate-FA (PRENATAL VITAMIN) 27-0.8 MG TABS Take 1 tablet by mouth daily. 30 tablet  11   promethazine (PHENERGAN) 25 MG tablet Take 1 tablet (25 mg total) by mouth every 6 (six) hours as needed for nausea or vomiting. 30 tablet 1   No current facility-administered medications on file prior to visit.   Allergies  Allergen Reactions   Azithromycin Swelling    Possible reaction to current medication   Fioricet [Butalbital-Apap-Caffeine] Swelling    Possible reaction to current medication    I have reviewed patient's Past Medical Hx, Surgical Hx, Family Hx, Social Hx, medications and allergies.   Review of Systems Review of Systems  Constitutional: Negative for fever and chills.  Gastrointestinal: Negative for nausea, vomiting, abdominal pain, diarrhea and  constipation.  Genitourinary: Negative for dysuria.  Musculoskeletal: Negative for back pain.  Neurological: Negative for dizziness and weakness.    Physical Exam  LMP 05/23/2021   GENERAL: Well-developed, well-nourished female in no acute distress.  HEENT: Normocephalic, atraumatic.   LUNGS: Effort normal ABDOMEN: soft, non-tender HEART: Regular rate  SKIN: Warm, dry and without erythema PSYCH: Normal mood and affect NEURO: Alert and oriented x 4  LAB RESULTS No results found for this or any previous visit (from the past 24 hour(s)).  IMAGING US OB Comp Less 14 Wks  Result Date: 08/09/2021 CLINICAL DATA:  Viability Exam: OBSTETRIC <14 WK [place] and TRANSVAGINAL OB US Technique:  Transvaginal ultrasound examination was performed for complete evaluation of the gestation as well at the maternal uterus, adnexal regions, and pelvic cul-de-sac.  Transvaginal technique was performed to assess early pregnancy. Comparison:  NA Findings: Singleton IUP noted Yolk sac: visualized Embryo: visualized Cardiac activity: visualized CRL: 4.40 cm  [place] EDC:  02/27/22 Cervix: nl Adnexa: nl Subchorionic hemorrhage:  NA Other findings:  NA Impression: Single IUP [redacted]w[redacted]d by CRL, EDD 02/27/22 Cardiac activity noted Recommendations: F/U OB U/S as clinically indicated   ASSESSMENT 1. Intrauterine pregnancy   2. Pregnancy with inconclusive fetal viability, single or unspecified fetus     PLAN Patient advised to start/continue taking prenatal vitamins Diabetes Education appointment rescheduled Patient scheduled to start prenatal care. Brief discussion of importance of tight blood sugar control to reduce risks to mother and baby in pregnancy and birth.    Katrinka Blazing, [place], [place]  08/09/2021  10:57 AM

## 2021-08-11 ENCOUNTER — Other Ambulatory Visit: Payer: Managed Care, Other (non HMO)

## 2021-08-17 ENCOUNTER — Encounter: Payer: Managed Care, Other (non HMO) | Admitting: Family Medicine

## 2021-08-18 ENCOUNTER — Other Ambulatory Visit: Payer: Self-pay

## 2021-08-18 ENCOUNTER — Ambulatory Visit (INDEPENDENT_AMBULATORY_CARE_PROVIDER_SITE_OTHER): Payer: Managed Care, Other (non HMO) | Admitting: Registered"

## 2021-08-18 ENCOUNTER — Encounter: Payer: Managed Care, Other (non HMO) | Attending: Family Medicine | Admitting: Registered"

## 2021-08-18 DIAGNOSIS — Z713 Dietary counseling and surveillance: Secondary | ICD-10-CM | POA: Diagnosis not present

## 2021-08-18 DIAGNOSIS — O24319 Unspecified pre-existing diabetes mellitus in pregnancy, unspecified trimester: Secondary | ICD-10-CM | POA: Diagnosis present

## 2021-08-18 DIAGNOSIS — Z3A00 Weeks of gestation of pregnancy not specified: Secondary | ICD-10-CM | POA: Insufficient documentation

## 2021-08-18 DIAGNOSIS — O24111 Pre-existing diabetes mellitus, type 2, in pregnancy, first trimester: Secondary | ICD-10-CM

## 2021-08-18 NOTE — Progress Notes (Signed)
AMN video interpreter Elita Quick [phone]  Insulin Instruction follow-up  08/18/2021  Current medications: NPH: 10 units am 10 units pm Metformin 1000 mg bid    Per dietary recall patient can make a few small tweaks to diet, but will still need adjustments to insulin dosing (see Plan). Pt states she is almost out of supplies. At first diabetes visit she did not have insurance and was given Prodigy meter, but now has insurance and can use Accu chek meter.   The following learning objectives were met by the patient during this visit:  Insulin Action of NPH & R insulins  Reason for adding Regular insulin for meals  Drawing up single and mixed doses Injecting Regular insulin 15-20 min prior to meal  Hygiene and storage   Patient demonstrated understanding of insulin administration by return demonstration.  Patient received the following handouts: Spanish BD handout for mixing insulin with the new insulin Rx dosing                          Plan for medication management: NPH insulin 12 units bid  Regular insulin 4 units with breakfast and 4 units with dinner Continue metformin 1000 mg bid  Patient will be seen for follow-up in 1 weeks or as needed.

## 2021-08-19 ENCOUNTER — Other Ambulatory Visit: Payer: Self-pay

## 2021-08-20 MED ORDER — INSULIN REGULAR HUMAN 100 UNIT/ML IJ SOLN: 4.0000 [IU] | Freq: Two times a day (BID) | INTRAMUSCULAR | 11 refills | Status: DC

## 2021-08-20 NOTE — Addendum Note (Signed)
Addended by: Geanie Berlin on: 08/20/2021 06:56 AM   Modules accepted: Orders

## 2021-08-22 ENCOUNTER — Ambulatory Visit (INDEPENDENT_AMBULATORY_CARE_PROVIDER_SITE_OTHER): Payer: Managed Care, Other (non HMO) | Admitting: Registered"

## 2021-08-22 ENCOUNTER — Other Ambulatory Visit: Payer: Self-pay

## 2021-08-22 ENCOUNTER — Encounter: Payer: Managed Care, Other (non HMO) | Attending: Family Medicine | Admitting: Registered"

## 2021-08-22 ENCOUNTER — Telehealth: Payer: Self-pay | Admitting: General Practice

## 2021-08-22 DIAGNOSIS — O24319 Unspecified pre-existing diabetes mellitus in pregnancy, unspecified trimester: Secondary | ICD-10-CM | POA: Diagnosis not present

## 2021-08-22 DIAGNOSIS — R7309 Other abnormal glucose: Secondary | ICD-10-CM | POA: Diagnosis present

## 2021-08-22 DIAGNOSIS — O24111 Pre-existing diabetes mellitus, type 2, in pregnancy, first trimester: Secondary | ICD-10-CM

## 2021-08-22 NOTE — Progress Notes (Signed)
AMN video interpreter Jacinto Reap [phone]  Insulin Instruction follow-up  08/22/2021  Current medications: NPH insulin 12 units bid  Regular insulin 4 units with breakfast and 4 units with dinner (patient has not started yet, was not ready at pharmacy. It is ready for pick up now and Pt will pick up and start using today) Continue metformin 1000 mg bid    Per [facility] pharmacist, patient's insurance will cover Dexcom and Omnipod. Discussed next steps with patient and started a sample Dexcom today.  CGM Training - Dexcom G6 Lot # [id number] Exp# 02/15/2022  Start time: 0820    End time: 0930 Total time: 70 min  [x]   Download Dexcom G6 and Clarity apps to phone [x]   Accept invite to share data through Clarity [x]   Getting to know device: Sensor:  2 hour warmup for new sensor/transmitter Waterproof Receiver: Phone vs reader. Turn on Location and bluetooth [x]   Setting up device (high alert  180  , low alert 70  ) [x]   Setting alert profile: alarms are very loud. Also, not to panic if getting message that blood sugar is dropping [x]   Inserting sensor: 3" away from insulin pump - in line of site if using automated mode After application it takes 2 hr initial warm up before first reading.  []   Calibrating  [x]   Ending sensor session [x]   Lag time [x]   Trend arrows [x]   Going through security (don't put sensors in bags that go through xray) / MRI / CT scans []   Trouble shooting:   1000mg  acetaminophen every 6 hrs []   Tape guide:  [x]   Insulin dosing from CGM readings.   Patient has Dexcom tech support and my contact information.    Plan: Continue current medications Return in 1 week for blood sugar log review and to take next steps to get Omnipod insulin pump.   Patient will be seen for follow-up in 1 weeks or as needed.

## 2021-08-22 NOTE — Telephone Encounter (Signed)
Patient called into front office stating her pump is no longer working. She is getting an error message that says the device is not activated. Patient states her son got a hold of her phone and started pressing buttons and that's when she got the error message. Provided customer support phone number for dexcom to patient and recommended she call them now. Told patient to call us back today if she doesn't get a resolution. Patient verbalized understanding. Donata Clay assisted with spanish interpretation.

## 2021-08-22 NOTE — Progress Notes (Signed)
Orders faxed per [employer] form on 08/22/21 and signed by Dr Shawnie Pons.  Judeth Cornfield, RN

## 2021-08-25 ENCOUNTER — Other Ambulatory Visit: Payer: Self-pay | Admitting: Lactation Services

## 2021-08-25 ENCOUNTER — Telehealth: Payer: Self-pay | Admitting: Family Medicine

## 2021-08-25 MED ORDER — ACCU-CHEK SOFTCLIX LANCETS MISC: 12 refills | Status: DC

## 2021-08-25 MED ORDER — ACCU-CHEK GUIDE VI STRP: ORAL_STRIP | 12 refills | Status: AC

## 2021-08-25 MED ORDER — INSULIN ASPART 100 UNIT/ML IJ SOLN: INTRAMUSCULAR | 12 refills | Status: DC

## 2021-08-25 MED ORDER — ACCU-CHEK GUIDE W/DEVICE KIT: 1.0000 | PACK | 0 refills | Status: AC | PRN

## 2021-08-25 NOTE — Progress Notes (Signed)
Ordered Insulin for Omnipod per Heywood Bene, RD.

## 2021-08-25 NOTE — Telephone Encounter (Signed)
Spanish... Patient would like to speak to a nurse to see what are her protocols about traveling with her pregnancy.

## 2021-08-25 NOTE — Telephone Encounter (Signed)
Called pt with Spanish Interpreter Eda R., and asked pt what her question is about traveling.  Pt wants to know if she can travel.  Pt informed that she can travel up to 30 weeks however make sure that she gets up and walks at least every 2 hours.   Pt verbalized understanding.   Leonette Nutting  08/25/21

## 2021-08-30 ENCOUNTER — Telehealth: Payer: Self-pay | Admitting: Lactation Services

## 2021-08-30 ENCOUNTER — Other Ambulatory Visit: Payer: Self-pay | Admitting: Lactation Services

## 2021-08-30 MED ORDER — ACCU-CHEK SOFTCLIX LANCETS MISC: 12 refills | Status: AC

## 2021-08-30 NOTE — Progress Notes (Signed)
Clarified order for Soft Clix lancets at pharmacy request

## 2021-08-30 NOTE — Telephone Encounter (Signed)
PA for [employer] and [employer] corrected and faxed to [employer]. Fax confirmation received.

## 2021-08-31 MED ORDER — INSULIN LISPRO 100 UNIT/ML IJ SOLN: INTRAMUSCULAR | 11 refills | Status: DC

## 2021-08-31 NOTE — Addendum Note (Signed)
Addended by: Isabell Jarvis on: 08/31/2021 08:15 AM   Modules accepted: Orders

## 2021-09-01 ENCOUNTER — Other Ambulatory Visit: Payer: Self-pay | Admitting: Lactation Services

## 2021-09-01 ENCOUNTER — Encounter: Payer: Managed Care, Other (non HMO) | Admitting: Obstetrics & Gynecology

## 2021-09-01 MED ORDER — OMNIPOD 5 DEXG7G6 INTRO GEN 5 KIT: 1.0000 | PACK | Freq: Once | 0 refills | Status: AC

## 2021-09-01 MED ORDER — OMNIPOD 5 DEXG7G6 PODS GEN 5 MISC: 1.0000 | 0 refills | Status: DC

## 2021-09-01 NOTE — Progress Notes (Signed)
Omnipod ordered per Heywood Bene, RD

## 2021-09-02 ENCOUNTER — Encounter: Payer: Self-pay | Admitting: Registered"

## 2021-09-02 ENCOUNTER — Ambulatory Visit (INDEPENDENT_AMBULATORY_CARE_PROVIDER_SITE_OTHER): Payer: Managed Care, Other (non HMO) | Admitting: Obstetrics and Gynecology

## 2021-09-02 ENCOUNTER — Encounter: Payer: Self-pay | Admitting: Obstetrics and Gynecology

## 2021-09-02 VITALS — BP 110/68 | HR 89 | Wt 171.8 lb

## 2021-09-02 DIAGNOSIS — O24111 Pre-existing diabetes mellitus, type 2, in pregnancy, first trimester: Secondary | ICD-10-CM

## 2021-09-02 DIAGNOSIS — Z6832 Body mass index (BMI) 32.0-32.9, adult: Secondary | ICD-10-CM | POA: Insufficient documentation

## 2021-09-02 DIAGNOSIS — Z789 Other specified health status: Secondary | ICD-10-CM

## 2021-09-02 DIAGNOSIS — O09522 Supervision of elderly multigravida, second trimester: Secondary | ICD-10-CM

## 2021-09-02 DIAGNOSIS — O99212 Obesity complicating pregnancy, second trimester: Secondary | ICD-10-CM

## 2021-09-02 DIAGNOSIS — Z3A14 14 weeks gestation of pregnancy: Secondary | ICD-10-CM

## 2021-09-02 DIAGNOSIS — Z603 Acculturation difficulty: Secondary | ICD-10-CM

## 2021-09-02 DIAGNOSIS — E1165 Type 2 diabetes mellitus with hyperglycemia: Secondary | ICD-10-CM

## 2021-09-02 DIAGNOSIS — Z8751 Personal history of pre-term labor: Secondary | ICD-10-CM

## 2021-09-02 HISTORY — DX: Type 2 diabetes mellitus with hyperglycemia: E11.65

## 2021-09-02 NOTE — Progress Notes (Signed)
AMN Video - Caesar #?  Met with patient from 11:40 - 11:55  Patient was here to see Dr. Ilda Basset and met with me after to see where we are in getting her started with Omnipod pump.   Patient has insulin for Omnipod but not the pump or pods. RD called pharmacy and pods were ready, but kit needed to be ran through again and it was approved, will be in Monday, 7/17.   Patient will return Tuesday _0 :45 before she goes to work to get help registering her Omnipod.  After the product is registered, Courtney Heys can assign the training and we can schedule a pump start visit.  Patient Dexcom has not gone through yet.

## 2021-09-02 NOTE — Progress Notes (Unsigned)
    PRENATAL VISIT NOTE  Subjective:  Julie Brooks is a 36 y.o. G2R4270 at [redacted]w[redacted]d being seen today for ongoing prenatal care.  She is currently monitored for the following issues for this high-risk pregnancy and has History of preterm delivery; Language barrier; Supervision of high risk pregnancy, antepartum; AMA (advanced maternal age) multigravida 35+; Obesity affecting pregnancy; Pre-existing type 2 diabetes mellitus during pregnancy in first trimester; Poorly controlled diabetes mellitus (HCC); and BMI 32.0-32.9,adult on their problem list.  Patient reports no complaints.  Contractions: Not present. Vag. Bleeding: None.  Movement: Absent. Denies leaking of fluid.   The following portions of the patient's history were reviewed and updated as appropriate: allergies, current medications, past family history, past medical history, past social history, past surgical history and problem list.   Objective:   Vitals:   09/02/21 1104  BP: 110/68  Pulse: 89  Weight: 171 lb 12.8 oz (77.9 kg)    Fetal Status: Fetal Heart Rate (bpm): 152   Movement: Absent     General:  Alert, oriented and cooperative. Patient is in no acute distress.  Skin: Skin is warm and dry. No rash noted.   Cardiovascular: Normal heart rate noted  Respiratory: Normal respiratory effort, no problems with respiration noted  Abdomen: Soft, gravid, appropriate for gestational age.  Pain/Pressure: Absent     Pelvic: Cervical exam deferred        Extremities: Normal range of motion.  Edema: None  Mental Status: Normal mood and affect. Normal behavior. Normal judgment and thought content.   Assessment and Plan:  Pregnancy: [id number] at [redacted]w[redacted]d 1. [redacted] weeks gestation of pregnancy Pt confirms on asa - [place] MFM OB DETAIL +14 WK; Future  2. Language barrier Interpreter used  3. History of preterm delivery 2019 34wk PTL/PTB 2008 PTB  4. Obesity affecting pregnancy in second trimester  5. BMI 32.0-32.9,adult  6.  Multigravida of advanced maternal age in second trimester Pt to come back next week for lab only visit for afp and panorama  7. Pre-existing type 2 diabetes mellitus during pregnancy in first trimester Patient was on NPH 12 with breakfast and dinner and regular 4 with breakfast and dinner and states AM fastings were in the 80s-low 100s and 2 hour post prandials of 100s-120s.  She got her omnipod and insulin and hasn't taken any in the past few days bc she was confused about next steps. I talked to DM education and AJ will meet with her today for transitioning to the omnipod.  2wk RTC for cbg check  Preterm labor symptoms and general obstetric precautions including but not limited to vaginal bleeding, contractions, leaking of fluid and fetal movement were reviewed in detail with the patient. Please refer to After Visit Summary for other counseling recommendations.   Return in about 3 days (around 09/05/2021).  Future Appointments  Date Time Provider Department Center  09/05/2021  3:30 PM [facility] LAB [facility]  09/30/2021  9:15 AM Capitan Bing, MD [facility]  10/05/2021 10:15 AM [facility] NURSE [facility]  10/05/2021 10:30 AM [facility] US3 [facility]     Bing, MD

## 2021-09-04 ENCOUNTER — Inpatient Hospital Stay (HOSPITAL_COMMUNITY): Payer: Managed Care, Other (non HMO)

## 2021-09-04 ENCOUNTER — Inpatient Hospital Stay (HOSPITAL_COMMUNITY)
Admission: AD | Admit: 2021-09-04 | Discharge: 2021-09-04 | Disposition: A | Discharge status: Home / Self Care | Payer: Managed Care, Other (non HMO) | Attending: Obstetrics and Gynecology | Admitting: Obstetrics and Gynecology

## 2021-09-04 ENCOUNTER — Encounter (HOSPITAL_COMMUNITY): Payer: Self-pay | Admitting: Obstetrics and Gynecology

## 2021-09-04 DIAGNOSIS — O09522 Supervision of elderly multigravida, second trimester: Secondary | ICD-10-CM | POA: Diagnosis not present

## 2021-09-04 DIAGNOSIS — O468X2 Other antepartum hemorrhage, second trimester: Secondary | ICD-10-CM | POA: Diagnosis not present

## 2021-09-04 DIAGNOSIS — O4692 Antepartum hemorrhage, unspecified, second trimester: Secondary | ICD-10-CM

## 2021-09-04 DIAGNOSIS — O26852 Spotting complicating pregnancy, second trimester: Secondary | ICD-10-CM | POA: Diagnosis present

## 2021-09-04 DIAGNOSIS — O418X20 Other specified disorders of amniotic fluid and membranes, second trimester, not applicable or unspecified: Secondary | ICD-10-CM

## 2021-09-04 DIAGNOSIS — O09212 Supervision of pregnancy with history of pre-term labor, second trimester: Secondary | ICD-10-CM

## 2021-09-04 DIAGNOSIS — Z3A14 14 weeks gestation of pregnancy: Secondary | ICD-10-CM

## 2021-09-04 NOTE — MAU Note (Signed)
.  Julie Brooks is a 36 y.o. at [redacted]w[redacted]d here in MAU reporting: gush of bloody fluid this morning. PT denies any pain today   Onset of complaint: 0645 Pain score: 0 There were no vitals filed for this visit.   FHT: Lab orders placed from triage:

## 2021-09-04 NOTE — MAU Provider Note (Signed)
History     [id number]  Arrival date and time: 09/04/21 0737    Chief Complaint  Patient presents with   Vaginal Bleeding     HPI Julie Brooks is a 36 y.o. at 43w6dwho presents for vaginal bleeding. Reports episode of vaginal bleeding that started about 530 this morning. Noticed bright red blood in her pants. Since then has seen pink when she wipes. Denies abdominal pain, dysuria, vaginal discharge, or recent intercourse.    OB History     Gravida  5   Para  2   Term  0   Preterm  2   AB  2   Living  2      SAB  2   IAB  0   Ectopic  0   Multiple  0   Live Births  2           Past Medical History:  Diagnosis Date   Back pain    Hypertriglyceridemia 11/25/2020   09/29/2020   Poorly controlled diabetes mellitus ([facility] 09/02/2021   A1c 10.3 at 8wks    Past Surgical History:  Procedure Laterality Date   APPENDECTOMY     DILATION AND CURETTAGE OF UTERUS  2004    Family History  Problem Relation Age of Onset   Diabetes Mother    Hypertension Mother    Diabetes Father    Diabetes Brother    Asthma Daughter    Breast cancer Paternal Aunt    Prostate cancer Paternal Uncle     Allergies  Allergen Reactions   Azithromycin Swelling    Possible reaction to current medication   Fioricet [Butalbital-Apap-Caffeine] Swelling    Possible reaction to current medication    No current facility-administered medications on file prior to encounter.   Current Outpatient Medications on File Prior to Encounter  Medication Sig Dispense Refill   Accu-Chek Softclix Lancets lancets To check blood sugars 4 times a day. Fasting and 2 hours after the first bite of breakfast, lunch and dinner. 100 each 12   aspirin EC 81 MG tablet Take 1 tablet (81 mg total) by mouth daily. Swallow whole. 30 tablet 12   Blood Glucose Monitoring Suppl (ACCU-CHEK GUIDE) w/Device KIT 1 Device by Does not apply route as needed. 1 kit 0   glucose blood (ACCU-CHEK GUIDE) test  strip Use as instructed 100 each 12   Insulin Disposable Pump (OMNIPOD 5 G6 POD, GEN 5,) MISC 1 Device by Does not apply route every 3 (three) days. 3 each 0   insulin lispro (HUMALOG) 100 UNIT/ML injection For use in Omnipod, current rate is 24 units a day as directed . TMD 7212m10 mL 11   insulin NPH Human (NOVOLIN N) 100 UNIT/ML injection Inject 0.1 mLs (10 Units total) into the skin 2 (two) times daily before a meal. 10 mL 3   Insulin Syringe-Needle U-100 (INSULIN SYRINGE .5CC/31GX5/16") 31G X 5/16" 0.5 ML MISC 1 Syringe by Does not apply route 2 (two) times daily. 100 each 11   metFORMIN (GLUCOPHAGE) 1000 MG tablet Take 1 tablet (1,000 mg total) by mouth 2 (two) times daily with a meal. 60 tablet 11   Prenatal Vit-Fe Fumarate-FA (PRENATAL VITAMIN) 27-0.8 MG TABS Take 1 tablet by mouth daily. 30 tablet 11   promethazine (PHENERGAN) 25 MG tablet Take 1 tablet (25 mg total) by mouth every 6 (six) hours as needed for nausea or vomiting. 30 tablet 1     ROS Pertinent positives and negative  per HPI, all others reviewed and negative  Physical Exam   BP 114/73 (BP Location: Right Arm)   Pulse 99   Temp 98.3 F (36.8 C) (Oral)   Resp 15   LMP 05/23/2021   SpO2 99%   Patient Vitals for the past 24 hrs:  BP Temp Temp src Pulse Resp SpO2  09/04/21 1037 114/73 -- -- 99 15 --  09/04/21 0800 122/79 -- -- (!) 104 -- 99 %  09/04/21 0744 132/90 98.3 F (36.8 C) Oral (!) 101 16 98 %    Physical Exam Vitals and nursing note reviewed. Exam conducted with a chaperone present.  Constitutional:      General: She is not in acute distress.    Appearance: Normal appearance.  HENT:     Head: Normocephalic and atraumatic.  Eyes:     General: No scleral icterus.    Conjunctiva/sclera: Conjunctivae normal.  Pulmonary:     Effort: Pulmonary effort is normal. No respiratory distress.  Abdominal:     Palpations: Abdomen is soft.     Tenderness: There is no abdominal tenderness. There is no  guarding.  Genitourinary:    Comments: Pelvic: small amount of dark red blood. Cervix closed.  Skin:    General: Skin is warm and dry.  Neurological:     Mental Status: She is alert.  Psychiatric:        Mood and Affect: Mood normal.        Behavior: Behavior normal.      Bedside Ultrasound Pt informed that the ultrasound is considered a limited OB ultrasound and is not intended to be a complete ultrasound exam.  Patient also informed that the ultrasound is not being completed with the intent of assessing for fetal or placental anomalies or any pelvic abnormalities.  Explained that the purpose of today's ultrasound is to assess for  viability.  Patient acknowledges the purpose of the exam and the limitations of the study.     My interpretation: Live IUP. FHR 162 bpm   Labs No results found for this or any previous visit (from the past 24 hour(s)).  Imaging [place] MFM OB LIMITED  Result Date: 09/04/2021 ----------------------------------------------------------------------  OBSTETRICS REPORT                        (Signed Final 09/04/2021 10:08 am) ---------------------------------------------------------------------- Patient Info  ID #:       [id number]                          D.O.B.:  03-10-1985 (36 yrs)  Name:       Julie Brooks             Visit Date: 09/04/2021 09:12 am ---------------------------------------------------------------------- Performed By  Attending:        Johnell Comings MD         Referred By:       [facility] MAU/Triage  Performed By:     Hubert Azure          [facility] ----------------------------------------------------------------------  Orders  #  Description                           Code        Ordered By  1  [place] MFM OB LIMITED                     35329.92    Jorje Guild ----------------------------------------------------------------------  #  Order #                      Accession #                Episode #  1  [id number]                   [id number]                 [id number] ---------------------------------------------------------------------- Indications  Vaginal bleeding in pregnancy, second           O46.92  trimester  Advanced maternal age multigravida 38+,         O90.522  second trimester  [redacted] weeks gestation of pregnancy                 Z3A.14  Poor obstetric history: Previous preterm        O09.219  delivery, antepartum (35w,36w) ---------------------------------------------------------------------- Fetal Evaluation  Num Of Fetuses:          1  Fetal Heart Rate(bpm):   164  Cardiac Activity:        Observed  Presentation:            Cephalic  Placenta:                Posterior Previa  P. Cord Insertion:       Visualized, central  Amniotic Fluid  AFI FV:      Within normal limits                              Largest Pocket(cm)                              3.33  Comment:    ?Small subchorionic hemorrhage noted (1.8x0.5x2.1). Location              of placenta related to early gestational age. ---------------------------------------------------------------------- OB History  Gravidity:    5         Prem:   2         SAB:   2 ---------------------------------------------------------------------- Gestational Age  LMP:           14w 6d        Date:  05/23/21                   EDD:   02/27/22  Best:          14w 6d     Det. By:  LMP  (05/23/21)          EDD:   02/27/22 ---------------------------------------------------------------------- Cervix Uterus Adnexa  Cervix  Appears closed, GA <16weeks  Uterus  No abnormality visualized.  Right Ovary  Within normal limits.  Left Ovary  Within normal limits.  Adnexa  No abnormality visualized. ---------------------------------------------------------------------- Comments  This patient presented to the MAU due to vaginal bleeding.  A viable singleton gestation is  noted on today's exam.  A normal appearing posterior placenta is noted.  Placenta  previa possibly due to her early gestational age is  noted. ----------------------------------------------------------------------                   Johnell Comings, MD Electronically Signed Final Report   09/04/2021 10:08 am ----------------------------------------------------------------------   MAU Course  Procedures Lab Orders  No laboratory test(s) ordered today   No orders of the defined types were placed in this encounter.  Imaging Orders         [place] MFM OB LIMITED      MDM RN unable to doppler FHTs. BSUS performed - active fetus with +FCA Small amount of dark red blood on exam & cervix closed.  Limited ultrasound ordered - shows subchorionic hemorrhage, Placenta previa incidental finding & likely related to early gestational age  44 positive  Reviewed results with patient. Discussed pelvic rest & bleeding precautions. Discussed that previa is likely d/t gestational age & will most likely resolve but will be reassessed during her anatomy scan.  Assessment and Plan   1. Subchorionic hemorrhage of placenta in second trimester, single or unspecified fetus   2. [redacted] weeks gestation of pregnancy    -Reviewed bleeding precautions & reasons to return to MAU -Pelvic rest -[facility] provided Spanish interpreter used for this encounter  Jorje Guild, NP 09/04/21 10:44 AM

## 2021-09-05 ENCOUNTER — Other Ambulatory Visit: Payer: Managed Care, Other (non HMO)

## 2021-09-05 ENCOUNTER — Other Ambulatory Visit: Payer: Self-pay

## 2021-09-05 DIAGNOSIS — O0990 Supervision of high risk pregnancy, unspecified, unspecified trimester: Secondary | ICD-10-CM

## 2021-09-06 ENCOUNTER — Encounter: Payer: Self-pay | Admitting: Registered"

## 2021-09-06 ENCOUNTER — Encounter: Payer: Managed Care, Other (non HMO) | Attending: Family Medicine | Admitting: Registered"

## 2021-09-06 NOTE — Progress Notes (Unsigned)
Patient brought in her Omnipod starter kit. RD assisted in getting product registered. When the patient has been added to the [facility] portal Levada Dy will call patient to schedule pump training/start.  Patient has not yet received her Dexcom sensors, only the transmitter. RD will look into.

## 2021-09-07 ENCOUNTER — Other Ambulatory Visit: Payer: Self-pay | Admitting: Lactation Services

## 2021-09-07 LAB — AFP, SERUM, OPEN SPINA BIFIDA
AFP MoM: 1.58
AFP Value: 35.3 ng/mL
Gest. Age on Collection Date: 15 weeks
Maternal Age At EDD: 38.6 yr
OSBR Risk 1 IN: 660
Test Results:: NEGATIVE
Weight: 171 [lb_av]

## 2021-09-07 MED ORDER — DEXCOM G6 TRANSMITTER MISC: 1.0000 | 1 refills | Status: AC

## 2021-09-07 MED ORDER — DEXCOM G6 SENSOR MISC: 1.0000 | 6 refills | Status: DC

## 2021-09-07 NOTE — Progress Notes (Signed)
Dexcom ordered per Heywood Bene, RD

## 2021-09-08 ENCOUNTER — Encounter: Payer: Managed Care, Other (non HMO) | Admitting: Family Medicine

## 2021-09-10 ENCOUNTER — Encounter (HOSPITAL_COMMUNITY): Payer: Self-pay | Admitting: Obstetrics & Gynecology

## 2021-09-10 ENCOUNTER — Inpatient Hospital Stay (HOSPITAL_COMMUNITY)
Admission: AD | Admit: 2021-09-10 | Discharge: 2021-09-10 | Disposition: A | Discharge status: Home / Self Care | Payer: Managed Care, Other (non HMO) | Attending: Obstetrics & Gynecology | Admitting: Obstetrics & Gynecology

## 2021-09-10 DIAGNOSIS — O0990 Supervision of high risk pregnancy, unspecified, unspecified trimester: Secondary | ICD-10-CM

## 2021-09-10 DIAGNOSIS — O09522 Supervision of elderly multigravida, second trimester: Secondary | ICD-10-CM

## 2021-09-10 DIAGNOSIS — Z3A15 15 weeks gestation of pregnancy: Secondary | ICD-10-CM | POA: Diagnosis not present

## 2021-09-10 DIAGNOSIS — O24112 Pre-existing diabetes mellitus, type 2, in pregnancy, second trimester: Secondary | ICD-10-CM | POA: Diagnosis not present

## 2021-09-10 DIAGNOSIS — O09212 Supervision of pregnancy with history of pre-term labor, second trimester: Secondary | ICD-10-CM | POA: Diagnosis not present

## 2021-09-10 DIAGNOSIS — O4690 Antepartum hemorrhage, unspecified, unspecified trimester: Secondary | ICD-10-CM | POA: Diagnosis not present

## 2021-09-10 DIAGNOSIS — O4403 Placenta previa specified as without hemorrhage, third trimester: Secondary | ICD-10-CM | POA: Diagnosis present

## 2021-09-10 DIAGNOSIS — N898 Other specified noninflammatory disorders of vagina: Secondary | ICD-10-CM

## 2021-09-10 DIAGNOSIS — Z794 Long term (current) use of insulin: Secondary | ICD-10-CM | POA: Insufficient documentation

## 2021-09-10 DIAGNOSIS — O99212 Obesity complicating pregnancy, second trimester: Secondary | ICD-10-CM | POA: Insufficient documentation

## 2021-09-10 DIAGNOSIS — Z79899 Other long term (current) drug therapy: Secondary | ICD-10-CM | POA: Insufficient documentation

## 2021-09-10 DIAGNOSIS — Z7982 Long term (current) use of aspirin: Secondary | ICD-10-CM | POA: Diagnosis not present

## 2021-09-10 DIAGNOSIS — Z7984 Long term (current) use of oral hypoglycemic drugs: Secondary | ICD-10-CM | POA: Diagnosis not present

## 2021-09-10 DIAGNOSIS — O4402 Placenta previa specified as without hemorrhage, second trimester: Secondary | ICD-10-CM | POA: Diagnosis present

## 2021-09-10 DIAGNOSIS — O4412 Placenta previa with hemorrhage, second trimester: Secondary | ICD-10-CM | POA: Diagnosis present

## 2021-09-10 HISTORY — DX: Complete placenta previa nos or without hemorrhage, third trimester: O44.03

## 2021-09-10 LAB — WET PREP, GENITAL
Clue Cells Wet Prep HPF POC: NONE SEEN
Sperm: NONE SEEN
Trich, Wet Prep: NONE SEEN
WBC, Wet Prep HPF POC: <10 (ref <10)
Yeast Wet Prep HPF POC: NONE SEEN

## 2021-09-10 LAB — URINALYSIS, ROUTINE W REFLEX MICROSCOPIC
Bilirubin Urine: NEGATIVE
Glucose, UA: NEGATIVE mg/dL
Ketones, ur: 20 mg/dL — AB
Leukocytes,Ua: NEGATIVE
Nitrite: NEGATIVE
Protein, ur: NEGATIVE mg/dL
Specific Gravity, Urine: 1.013 (ref 1.005–1.030)
pH: 7 (ref 5.0–8.0)

## 2021-09-10 NOTE — MAU Note (Signed)
.  Julie Brooks is a 36 y.o. at [redacted]w[redacted]d here in MAU reporting: EMS arrival:. Pt went to BR at home and noticed bright red bleeding when she wiped. Called EMS. Denies any pain or cramping. Was seen in MAU a week ago for spotting.  LMP:  Onset of complaint: 1hr Pain score: 0 Vitals:   09/10/21 1156  BP: 114/72  Pulse: 89  Resp: 18  Temp: 98.2 F (36.8 C)     FHT:156 Lab orders placed from triage:

## 2021-09-10 NOTE — MAU Provider Note (Addendum)
MAU Provider Note  History  [id number]  Arrival date and time: 09/10/21 1150   Chief Complaint  Patient presents with   Vaginal Bleeding    HPI Julie Brooks is a [id number] 36 y.o. at 30w5dby LMP with PMHx notable for history of preterm delivery, AMA, obesity, A2 DM, current placenta previa and subchronic hemorrhage (noted on 09/04/2021 Limited [place], who presents for vaginal bleeding.  Patient presented 8 days ago with the same complaint and at that point patient had slowly decreasing amount of vaginal bleeding.  She noticed that a couple days after her MAU presentation, she was only noting scant blood when wiping with toilet paper.  However, this morning she had an urge to use the bathroom, she passed "something odd" from her vagina and when she looked in the toilet the entire bowl was bright red.  At that point she was concerned for the return of vaginal bleeding.  She declines recent trauma or sexual intercourse.  She notes she has been on bedrest and has limited any lifting since 7/16.  She does note fetal movement, declines contractions.  She admits vaginal irritation/itching, particularly when she urinates.  She also notes some abdominal pain on her sides bilaterally leg comes and goes, but this is not new.  [facility] Review of prior ED encounters: Yes  Vaginal bleeding: Yes LOF: No Fetal Movement: Yes Contractions: No  O/Positive/-- (06/01 1430)  OB History     Gravida  5   Para  2   Term  0   Preterm  2   AB  2   Living  2      SAB  2   IAB  0   Ectopic  0   Multiple  0   Live Births  2           Past Medical History:  Diagnosis Date   Back pain    Hypertriglyceridemia 11/25/2020   09/29/2020   Poorly controlled diabetes mellitus ([place] 09/02/2021   A1c 10.3 at 8wks    Past Surgical History:  Procedure Laterality Date   APPENDECTOMY     DILATION AND CURETTAGE OF UTERUS  2004    Family History  Problem  Relation Age of Onset   Diabetes Mother    Hypertension Mother    Diabetes Father    Diabetes Brother    Asthma Daughter    Breast cancer Paternal Aunt    Prostate cancer Paternal Uncle     Social History   Socioeconomic History   Marital status: Single    Spouse name: Not on file   Number of children: Not on file   Years of education: Not on file   Highest education level: Not on file  Occupational History   Not on file  Tobacco Use   Smoking status: Never   Smokeless tobacco: Never  Vaping Use   Vaping Use: Never used  Substance and Sexual Activity   Alcohol use: Not Currently    Comment: socially , stopped when found out pregnant   Drug use: No   Sexual activity: Yes    Birth control/protection: None    Comment: nexplanon removed in December 2022  Other Topics Concern   Not on file  Social History Narrative   Not on file   Social Determinants of Health   Financial Resource Strain: Not on file  Food Insecurity: No Food Insecurity (09/02/2021)   Hunger Vital Sign  Worried About Charity fundraiser in the Last Year: Never true    [place] in the Last Year: Never true  Transportation Needs: No Transportation Needs (09/02/2021)   PRAPARE - Hydrologist (Medical): No    Lack of Transportation (Non-Medical): No  Physical Activity: Not on file  Stress: Not on file  Social Connections: Not on file  Intimate Partner Violence: Not At Risk (07/21/2021)   Humiliation, Afraid, Rape, and Kick questionnaire    Fear of Current or Ex-Partner: No    Emotionally Abused: No    Physically Abused: No    Sexually Abused: No    Allergies  Allergen Reactions   Azithromycin Swelling    Possible reaction to current medication   Fioricet [Butalbital-Apap-Caffeine] Swelling    Possible reaction to current medication    No current facility-administered medications on file prior to encounter.   Current Outpatient Medications on File Prior to  Encounter  Medication Sig Dispense Refill   aspirin EC 81 MG tablet Take 1 tablet (81 mg total) by mouth daily. Swallow whole. 30 tablet 12   Insulin Syringe-Needle U-100 (INSULIN SYRINGE .5CC/31GX5/16") 31G X 5/16" 0.5 ML MISC 1 Syringe by Does not apply route 2 (two) times daily. 100 each 11   metFORMIN (GLUCOPHAGE) 1000 MG tablet Take 1 tablet (1,000 mg total) by mouth 2 (two) times daily with a meal. 60 tablet 11   Prenatal Vit-Fe Fumarate-FA (PRENATAL VITAMIN) 27-0.8 MG TABS Take 1 tablet by mouth daily. 30 tablet 11   promethazine (PHENERGAN) 25 MG tablet Take 1 tablet (25 mg total) by mouth every 6 (six) hours as needed for nausea or vomiting. 30 tablet 1   Accu-Chek Softclix Lancets lancets To check blood sugars 4 times a day. Fasting and 2 hours after the first bite of breakfast, lunch and dinner. 100 each 12   Blood Glucose Monitoring Suppl (ACCU-CHEK GUIDE) w/Device KIT 1 Device by Does not apply route as needed. 1 kit 0   Continuous Blood Gluc Sensor (DEXCOM G6 SENSOR) MISC 1 Device by Does not apply route as directed for 10 days. 1 each 6   Continuous Blood Gluc Transmit (DEXCOM G6 TRANSMITTER) MISC 1 Device by Does not apply route every 3 (three) months. 3 each 1   glucose blood (ACCU-CHEK GUIDE) test strip Use as instructed 100 each 12   Insulin Disposable Pump (OMNIPOD 5 G6 POD, GEN 5,) MISC 1 Device by Does not apply route every 3 (three) days. 3 each 0   insulin lispro (HUMALOG) 100 UNIT/ML injection For use in Omnipod, current rate is 24 units a day as directed . TMD 753m 10 mL 11   insulin NPH Human (NOVOLIN N) 100 UNIT/ML injection Inject 0.1 mLs (10 Units total) into the skin 2 (two) times daily before a meal. 10 mL 3     Review of Systems  Constitutional:  Negative for chills and fever.  HENT:  Negative for congestion and ear pain.   Respiratory:  Negative for cough.   Cardiovascular:  Negative for palpitations.  Gastrointestinal:  Negative for vomiting.  Endocrine:  Negative for polydipsia.  Genitourinary:  Positive for vaginal bleeding. Negative for dysuria and hematuria.  Musculoskeletal:  Negative for back pain.  Skin:  Negative for rash.  Neurological:  Negative for weakness and headaches.    Pertinent positives and negative per HPI, all others reviewed and negative  Physical Exam   BP 114/72   Pulse 89  Temp 98.2 F (36.8 C)   Resp 18   LMP 05/23/2021   Patient Vitals for the past 24 hrs:  BP Temp Pulse Resp  09/10/21 1156 114/72 98.2 F (36.8 C) 89 18    Physical Exam Vitals reviewed. Exam conducted with a chaperone present.  Constitutional:      Appearance: Normal appearance.  HENT:     Head: Normocephalic and atraumatic.     Right Ear: External ear normal.     Left Ear: External ear normal.     Nose: Nose normal.     Mouth/Throat:     Pharynx: Oropharynx is clear.  Eyes:     Conjunctiva/sclera: Conjunctivae normal.  Cardiovascular:     Rate and Rhythm: Normal rate and regular rhythm.  Pulmonary:     Effort: Pulmonary effort is normal.  Abdominal:     Palpations: Abdomen is soft.     Comments: gravid  Genitourinary:    General: Normal vulva.     Exam position: Lithotomy position.     Tanner stage (genital): 5.     Vagina: Vaginal discharge and bleeding present.     Cervix: Discharge and cervical bleeding present.     Adnexa:        Left: No tenderness.       Rectum: Normal.  Skin:    General: Skin is warm.     Capillary Refill: Capillary refill takes less than 2 seconds.  Neurological:     Mental Status: Mental status is at baseline.  Psychiatric:        Mood and Affect: Mood normal.      Cervical Exam No SVE performed Speculum, exam with noted findings above  Bedside Ultrasound Pt informed that the ultrasound is considered a limited OB ultrasound and is not intended to be a complete ultrasound exam.  Patient also informed that the ultrasound is not being completed with the intent of assessing for fetal  or placental anomalies or any pelvic abnormalities.  Explained that the purpose of today's ultrasound is to assess for  viability.  Patient acknowledges the purpose of the exam and the limitations of the study.    My interpretation: [place] IUP.  FHT 154 bpm by doppler. Posterior placenta.   FHT 156 bpm  Labs Results for orders placed or performed during the hospital encounter of 09/10/21 (from the past 24 hour(s))  Wet prep, genital     Status: None   Collection Time: 09/10/21 12:36 PM   Specimen: PATH Cytology Cervicovaginal Ancillary Only  Result Value Ref Range   Yeast Wet Prep HPF POC NONE SEEN NONE SEEN   Trich, Wet Prep NONE SEEN NONE SEEN   Clue Cells Wet Prep HPF POC NONE SEEN NONE SEEN   WBC, Wet Prep HPF POC <10 <10   Sperm NONE SEEN   Urinalysis, Routine w reflex microscopic Urine, Clean Catch     Status: Abnormal   Collection Time: 09/10/21 12:48 PM  Result Value Ref Range   Color, Urine YELLOW YELLOW   APPearance HAZY (A) CLEAR   Specific Gravity, Urine 1.013 1.005 - 1.030   pH 7.0 5.0 - 8.0   Glucose, UA NEGATIVE NEGATIVE mg/dL   Hgb urine dipstick LARGE (A) NEGATIVE   Bilirubin Urine NEGATIVE NEGATIVE   Ketones, ur 20 (A) NEGATIVE mg/dL   Protein, ur NEGATIVE NEGATIVE mg/dL   Nitrite NEGATIVE NEGATIVE   Leukocytes,Ua NEGATIVE NEGATIVE   RBC / HPF 21-50 0 - 5 RBC/hpf   WBC, UA 0-5  0 - 5 WBC/hpf   Bacteria, UA RARE (A) NONE SEEN   Squamous Epithelial / LPF 0-5 0 - 5   Mucus PRESENT     Imaging No results found.  MAU Course  Procedures Lab Orders         Wet prep, genital         Urinalysis, Routine w reflex microscopic    No orders of the defined types were placed in this encounter.  Imaging Orders  No imaging studies ordered today    Assessment and Plan  Vaginal bleeding Placenta Previa [redacted] weeks gestation Fetal Well being FHT: 74   36 year old G5 P0-2-2-2 at 15 weeks and 5 days presenting for vaginal bleeding.  She had an ultrasound,  limited, on 09/04/2021 which showed posterior placenta previa and query of subchorionic hemorrhage.  Patient's bleeding improved after 7/15, however at this morning she passed large amount of blood, shoulder toilet and possibly passed a blood clot.  Associated vaginal itching/irritation.  No new abdominal pain.  Notes fetal movement.  Speculum exam showed coagulated blood in vaginal vault and in cervical os, however cervical os closed.  Noted discharge from os as well.  Wet prep negative.  Urinalysis negative for infection.  Gonorrhea/chlamydia pending.  Bedside ultrasound reassuring, see above.  MDM: Mild  Gave placental previa return and preterm labor precautions.  Follow-up with primary OB. Dispo: discharged to home in stable condition.  Allergies as of 09/10/2021       Reactions   Azithromycin Swelling   Possible reaction to current medication   Fioricet [butalbital-apap-caffeine] Swelling   Possible reaction to current medication        Medication List     TAKE these medications    Accu-Chek Guide test strip Generic drug: glucose blood Use as instructed   Accu-Chek Guide w/Device Kit 1 Device by Does not apply route as needed.   Accu-Chek Softclix Lancets lancets To check blood sugars 4 times a day. Fasting and 2 hours after the first bite of breakfast, lunch and dinner.   aspirin EC 81 MG tablet Take 1 tablet (81 mg total) by mouth daily. Swallow whole.   Dexcom G6 Sensor Misc 1 Device by Does not apply route as directed for 10 days.   Dexcom G6 Transmitter Misc 1 Device by Does not apply route every 3 (three) months.   insulin lispro 100 UNIT/ML injection Commonly known as: HumaLOG For use in Omnipod, current rate is 24 units a day as directed . TMD 735m   insulin NPH Human 100 UNIT/ML injection Commonly known as: NOVOLIN N Inject 0.1 mLs (10 Units total) into the skin 2 (two) times daily before a meal.   INSULIN SYRINGE .5CC/31GX5/16" 31G X 5/16" 0.5 ML  Misc 1 Syringe by Does not apply route 2 (two) times daily.   metFORMIN 1000 MG tablet Commonly known as: GLUCOPHAGE Take 1 tablet (1,000 mg total) by mouth 2 (two) times daily with a meal.   Omnipod 5 G6 Pod (Gen 5) Misc 1 Device by Does not apply route every 3 (three) days.   Prenatal Vitamin 27-0.8 MG Tabs Take 1 tablet by mouth daily.   promethazine 25 MG tablet Commonly known as: PHENERGAN Take 1 tablet (25 mg total) by mouth every 6 (six) hours as needed for nausea or vomiting.        JShelda Pal DO FMOB Fellow  [facility] 09/10/21  1:35 PM

## 2021-09-12 LAB — GC/CHLAMYDIA PROBE AMP (~~LOC~~) NOT AT ARMC
Chlamydia: NEGATIVE
Comment: NEGATIVE
Comment: NORMAL
Neisseria Gonorrhea: NEGATIVE

## 2021-09-13 LAB — PANORAMA PRENATAL TEST FULL PANEL:PANORAMA TEST PLUS 5 ADDITIONAL MICRODELETIONS: FETAL FRACTION: 4.6

## 2021-09-14 ENCOUNTER — Telehealth: Payer: Self-pay | Admitting: Registered"

## 2021-09-14 NOTE — Telephone Encounter (Signed)
Placed call using [facility] [phone]  Patient needs to watch videos on Omnipod website before she can be assigned for insulin pump training. Pt states she does not have anyone available to help her access the videos. Pt states she can come in for a visit with RD to do next steps to start pump training. After schedule is opened on Aug 17, she will be contacted to be scheduled.

## 2021-09-16 ENCOUNTER — Telehealth: Payer: Self-pay | Admitting: Registered"

## 2021-09-16 ENCOUNTER — Encounter: Payer: Managed Care, Other (non HMO) | Attending: Family Medicine | Admitting: Registered"

## 2021-09-16 ENCOUNTER — Encounter: Payer: Self-pay | Admitting: Registered"

## 2021-09-16 DIAGNOSIS — E118 Type 2 diabetes mellitus with unspecified complications: Secondary | ICD-10-CM | POA: Diagnosis not present

## 2021-09-16 DIAGNOSIS — Z3A00 Weeks of gestation of pregnancy not specified: Secondary | ICD-10-CM | POA: Diagnosis not present

## 2021-09-16 DIAGNOSIS — O24111 Pre-existing diabetes mellitus, type 2, in pregnancy, first trimester: Secondary | ICD-10-CM | POA: Diagnosis not present

## 2021-09-16 DIAGNOSIS — Z713 Dietary counseling and surveillance: Secondary | ICD-10-CM | POA: Insufficient documentation

## 2021-09-16 NOTE — Progress Notes (Signed)
[facility] Video Rose #[id number]  Patient was seen on 09/16/2021 for T2D in pregnancy diabetes education follow-up   Start 1048     End: 1150  Current Medication:  NPH 12 u bid Reg insulin 4 u w/ breakfast & dinner Met 1000 mg bid.  Topics reviewed: Steps required to get pump training set up Insulin refrigeration vs room temp and expiration Use of log sheet to track patterns of work days vs days off  Patient has received Omnpod starter kit, waiting for [facility] to assign training to Angela Johnston before patient can start using pump. First training availability is in 2 weeks.  PPBG dinner 130-140 mg/dL workdays (5 pm-5 am)  closer to 115 mg/dL on days off  Per Dexcom, data x/1 week BG 111-200 mg/dL  Graph from 7/24-7/25, grey = range 70-180 mg/dL    Discussed patient's blood sugar and delay for starting insulin pump with Dr. Pickens.   Per Dr Pickens instructed patient to increase insulin (below). Pt demonstrated understanding with teach-back. Pt had no further questions.  Plan: NPH insulin 14 units bid R insulin 7 units with breakfast, 4 units with dinner Continue metformin 1000 mg bid  RD will review Dexcom data and check with patient in 1 week 

## 2021-09-16 NOTE — Telephone Encounter (Signed)
[employer] Clerance Lav # [phone]  Pt states she can come into [facility] today at 10:45.

## 2021-09-18 ENCOUNTER — Other Ambulatory Visit: Payer: Self-pay | Admitting: Family Medicine

## 2021-09-28 ENCOUNTER — Telehealth: Payer: Self-pay | Admitting: Registered"

## 2021-09-28 NOTE — Telephone Encounter (Signed)
[employer] Verlon Au #[id number]  Called patient to let her know I cannot start her on the insulin pump as planned on Aug 11. Patient agreed to go to [facility] on [place], Aug 17 at 3 pm for Omnipod insulin pump start. Pt was instructed to charge controller prior to appointment. Patient did not understand what insulin to bring to appointment. RD will send MyChart msg using interpreter to help create.  Patient voiced understanding and no further questions.

## 2021-09-30 ENCOUNTER — Other Ambulatory Visit: Payer: Self-pay

## 2021-09-30 ENCOUNTER — Encounter: Payer: Self-pay | Admitting: Family Medicine

## 2021-09-30 ENCOUNTER — Ambulatory Visit (INDEPENDENT_AMBULATORY_CARE_PROVIDER_SITE_OTHER): Payer: Managed Care, Other (non HMO) | Admitting: Obstetrics and Gynecology

## 2021-09-30 VITALS — BP 117/76 | HR 94 | Wt 177.5 lb

## 2021-09-30 DIAGNOSIS — E1165 Type 2 diabetes mellitus with hyperglycemia: Secondary | ICD-10-CM

## 2021-09-30 DIAGNOSIS — O99212 Obesity complicating pregnancy, second trimester: Secondary | ICD-10-CM

## 2021-09-30 DIAGNOSIS — Z6832 Body mass index (BMI) 32.0-32.9, adult: Secondary | ICD-10-CM

## 2021-09-30 DIAGNOSIS — Z789 Other specified health status: Secondary | ICD-10-CM

## 2021-09-30 DIAGNOSIS — O09522 Supervision of elderly multigravida, second trimester: Secondary | ICD-10-CM

## 2021-09-30 DIAGNOSIS — Z603 Acculturation difficulty: Secondary | ICD-10-CM

## 2021-09-30 DIAGNOSIS — O0990 Supervision of high risk pregnancy, unspecified, unspecified trimester: Secondary | ICD-10-CM

## 2021-09-30 DIAGNOSIS — O4402 Placenta previa specified as without hemorrhage, second trimester: Secondary | ICD-10-CM

## 2021-09-30 DIAGNOSIS — O24111 Pre-existing diabetes mellitus, type 2, in pregnancy, first trimester: Secondary | ICD-10-CM

## 2021-09-30 DIAGNOSIS — Z3A18 18 weeks gestation of pregnancy: Secondary | ICD-10-CM

## 2021-09-30 DIAGNOSIS — E781 Pure hyperglyceridemia: Secondary | ICD-10-CM

## 2021-09-30 DIAGNOSIS — Z8751 Personal history of pre-term labor: Secondary | ICD-10-CM

## 2021-09-30 NOTE — Progress Notes (Signed)
Pt has Glucose log/when she wipes she shows brownish discharge. Pt reports that a part from her Dexcom fell off

## 2021-09-30 NOTE — Progress Notes (Signed)
PRENATAL VISIT NOTE  Subjective:  Julie Brooks is a 36 y.o. [id number] at [redacted]w[redacted]d being seen today for ongoing prenatal care.  She is currently monitored for the following issues for this high-risk pregnancy and has History of preterm delivery; Language barrier; Supervision of high risk pregnancy, antepartum; AMA (advanced maternal age) multigravida 35+; Obesity affecting pregnancy; Pre-existing type 2 diabetes mellitus during pregnancy in first trimester; Poorly controlled diabetes mellitus (HCC); BMI 32.0-32.9,adult; Lumbago with sciatica; Hypertriglyceridemia; and Placenta previa antepartum in second trimester on their problem list.  Patient reports  only brown d/c but pt went to MAU for vag bleeding on  7/16 and dx with placenta previa. CL appeared normal. Contractions: Not present. Vag. Bleeding: Other.  Movement: Present. Denies leaking of fluid.   The following portions of the patient's history were reviewed and updated as appropriate: allergies, current medications, past family history, past medical history, past social history, past surgical history and problem list.   Objective:   Vitals:   09/30/21 0949  BP: 117/76  Pulse: 94  Weight: 177 lb 8 oz (80.5 kg)    Fetal Status: Fetal Heart Rate (bpm): 152   Movement: Present     General:  Alert, oriented and cooperative. Patient is in no acute distress.  Skin: Skin is warm and dry. No rash noted.   Cardiovascular: Normal heart rate noted  Respiratory: Normal respiratory effort, no problems with respiration noted  Abdomen: Soft, gravid, appropriate for gestational age.  Pain/Pressure: Present     Pelvic: Cervical exam deferred        Extremities: Normal range of motion.  Edema: None  Mental Status: Normal mood and affect. Normal behavior. Normal judgment and thought content.   Assessment and Plan:  Pregnancy: [id number] at [redacted]w[redacted]d 1. Supervision of high risk pregnancy, antepartum Follow up mfm anatomy u/s. Afp neg - US Fetal  Echocardiography; Future  2. [redacted] weeks gestation of pregnancy  3. Placenta previa antepartum in second trimester Possible small (1-2cm) subchorionic hemorrhage noted with posterior previa. Patient told continue pelvic rest precautions until previa has cleared.  MAU precautions given  4. Pre-existing type 2 diabetes mellitus during pregnancy in first trimester Patient currently on NPH 12u bid and regular 4u with breakfast and dinner and metformin 1000 bid. CBGs wnl with a few just slightly high. She works nights and her AM fastings in the 100s-110s are true ones. D/w her difficulty in interpreter sugars with night shifts but CBGs look reassuring.   Dexcom broke and pt checking CBGs instead; pt already scheduled with DM education visit tomorrow and hopefully also get set up with omnipod.   RN to schedule fetal echo  5. BMI 32.0-32.9,adult 12lbs TWG  6. Obesity affecting pregnancy in second trimester  7. Multigravida of advanced maternal age in second trimester Low risk  panorama  8. Language barrier Interpreter used  9. History of preterm delivery Late preterm births. No need for current interventions.   Preterm labor symptoms and general obstetric precautions including but not limited to vaginal bleeding, contractions, leaking of fluid and fetal movement were reviewed in detail with the patient. Please refer to After Visit Summary for other counseling recommendations.   Return in about 2 weeks (around 10/14/2021) for in person, md visit, high risk ob.  Future Appointments  Date Time Provider Department Center  10/05/2021 10:15 AM [facility] NURSE [facility]  10/05/2021 10:30 AM [facility] US3 [facility]  10/06/2021  3:30 PM Carolan Shiver, RD [facility]  10/21/2021  10:55 AM Reva Bores, MD [facility]    [place], MD

## 2021-10-03 ENCOUNTER — Other Ambulatory Visit: Payer: Self-pay | Admitting: Family Medicine

## 2021-10-05 ENCOUNTER — Encounter: Payer: Self-pay | Admitting: *Deleted

## 2021-10-05 ENCOUNTER — Ambulatory Visit: Payer: Managed Care, Other (non HMO) | Admitting: Maternal & Fetal Medicine

## 2021-10-05 ENCOUNTER — Other Ambulatory Visit: Payer: Self-pay | Admitting: *Deleted

## 2021-10-05 ENCOUNTER — Ambulatory Visit: Payer: Managed Care, Other (non HMO) | Attending: Obstetrics and Gynecology

## 2021-10-05 ENCOUNTER — Ambulatory Visit: Payer: Managed Care, Other (non HMO) | Admitting: *Deleted

## 2021-10-05 VITALS — BP 102/62 | HR 89

## 2021-10-05 DIAGNOSIS — O4422 Partial placenta previa NOS or without hemorrhage, second trimester: Secondary | ICD-10-CM

## 2021-10-05 DIAGNOSIS — E119 Type 2 diabetes mellitus without complications: Secondary | ICD-10-CM | POA: Diagnosis not present

## 2021-10-05 DIAGNOSIS — Z363 Encounter for antenatal screening for malformations: Secondary | ICD-10-CM | POA: Insufficient documentation

## 2021-10-05 DIAGNOSIS — O0990 Supervision of high risk pregnancy, unspecified, unspecified trimester: Secondary | ICD-10-CM | POA: Insufficient documentation

## 2021-10-05 DIAGNOSIS — O09212 Supervision of pregnancy with history of pre-term labor, second trimester: Secondary | ICD-10-CM

## 2021-10-05 DIAGNOSIS — O99212 Obesity complicating pregnancy, second trimester: Secondary | ICD-10-CM

## 2021-10-05 DIAGNOSIS — O4420 Partial placenta previa NOS or without hemorrhage, unspecified trimester: Secondary | ICD-10-CM | POA: Insufficient documentation

## 2021-10-05 DIAGNOSIS — O09292 Supervision of pregnancy with other poor reproductive or obstetric history, second trimester: Secondary | ICD-10-CM | POA: Diagnosis not present

## 2021-10-05 DIAGNOSIS — Z8751 Personal history of pre-term labor: Secondary | ICD-10-CM

## 2021-10-05 DIAGNOSIS — O09522 Supervision of elderly multigravida, second trimester: Secondary | ICD-10-CM

## 2021-10-05 DIAGNOSIS — O365990 Maternal care for other known or suspected poor fetal growth, unspecified trimester, not applicable or unspecified: Secondary | ICD-10-CM

## 2021-10-05 DIAGNOSIS — Z3A19 19 weeks gestation of pregnancy: Secondary | ICD-10-CM

## 2021-10-05 DIAGNOSIS — O24112 Pre-existing diabetes mellitus, type 2, in pregnancy, second trimester: Secondary | ICD-10-CM

## 2021-10-05 DIAGNOSIS — O365920 Maternal care for other known or suspected poor fetal growth, second trimester, not applicable or unspecified: Secondary | ICD-10-CM

## 2021-10-05 DIAGNOSIS — O24111 Pre-existing diabetes mellitus, type 2, in pregnancy, first trimester: Secondary | ICD-10-CM

## 2021-10-05 DIAGNOSIS — Z3A14 14 weeks gestation of pregnancy: Secondary | ICD-10-CM | POA: Insufficient documentation

## 2021-10-05 DIAGNOSIS — O4402 Placenta previa specified as without hemorrhage, second trimester: Secondary | ICD-10-CM

## 2021-10-06 ENCOUNTER — Encounter: Payer: Managed Care, Other (non HMO) | Attending: Family Medicine | Admitting: Registered"

## 2021-10-06 DIAGNOSIS — O24111 Pre-existing diabetes mellitus, type 2, in pregnancy, first trimester: Secondary | ICD-10-CM | POA: Insufficient documentation

## 2021-10-06 DIAGNOSIS — O24112 Pre-existing diabetes mellitus, type 2, in pregnancy, second trimester: Secondary | ICD-10-CM | POA: Insufficient documentation

## 2021-10-06 NOTE — Progress Notes (Signed)
In-person interpreter Loraine Leriche from [employer]  Start time:  1530 End time:  1730  Patient is here today 10/06/2021 for training on the Omnipod 5 Insulin Pump.  It was confirmed that the patient was aware of the following: Blood glucose (BG) testing Treating hypoglycemia Hyperglycemia Carbohydrate counting   (Patient will be using carbohydrate counting with use of this pump.) Sick day management  Patient was instructed on the following: Have the following supplies available to be prepared: Omnipod PDM and pods Insulin and syringe Blood glucose meter, strips, lancets, lancing device Glucose tablets/fast acting source of carbohydrate/Glucagon  Supply Reorder [facility] Customer Care [phone] ext [phone] Reorder - info/contact number When to reorder (when last box of pods is opened)  System Overview Communication process/distance Storage guidelines Diagnostic tests (CT scans/MRI/Xrays) Travel guidelines  Pod: Fill port/adhesive/needle cap/pink slide insert/Waterproof  PDM Battery/button layout/wireless updates (software updates)  PDM Settings Pump Therapy Order Form with settings below Basic settings - Personalized lock screen/time/time zone/date/date format Basal settings Max basal 1.0 u/hr Basal rates 0.50 u/hr Temp basal  off Bolus settings - using manual mode - waiting for Dexcom Sensor refill Target Blood glucose  110 Insulin to carb (IC) ratio  19 Correction factor  70 for BG above 110 Minimum blood glucose for bolus calculations  -- Reverse correction Duration of insulin action  4 hrs Extended bolus off Max bolus 8 u Patient was able to accurately enter pump settings into PDM.  Pod Activation Change Pod  Room temperature insulin Fill syringe - min/max amounts DO NOT prefill Pod Site selection/rotation & prep Automated cannula insertion - check infusion site/viewing window & pink slide insert Blood glucose reminder 1.5 hours after insertion When to change  POD Patient was able to place pod and view insert.  Home Screen Status Bar, Menu Icon, Notification/Alarms Tabs Dashboard - IOB (if Calculator ON) - Instructed to leave calculator on Basal (Temp Basal if Temp Basal running) Pod Info - View Pod detail Last Bolus, Last BG Bolus button  Menu icon PDM function - Temp Basal, Pod, Enter BG, Suspend Manage Programs & Presets - Basal programs, temp basal presets, bolus presets Food Library ([employer] app, other tools for carbohydrate counting) (not using) History - Notifications & Alarms, Insulin & BG History Settings - PDM Device, Pod sites, Reminders, Blood Glucose, Basal & Temp Basal, Bolus About  Advanced Features (to be discussed at a further date based on patient needs) Extended bolus Temp basal rate Additional basal programs Presets - Temp basal/Bolus presets  Troubleshooting Hypoglycemia Sick day management resource Hyperglycemia & Ketones  Notifications & Alarms Custom reminders Pod Expiration alert Low Reservoir alert  Advisory & Hazard Alarms Advisory alarms - intermittent tones - response required Hazard alarm - Continuous vibration, and Tone - urgent attention required - Pod Expired, Empty Reservoir, Occlusion, Pod Error, Auto Off, PDM Error, System Error  Ongoing Success [place]  Omnipod app(s) Register for [employer] Customer Care  [phone] Reviewed User Guide Showed patient Customer's Bill of Rights and Responsibilities and instructed them to review.  Handouts Provided: Omnipod 5 user guide, selected pages in Spanish  Patient to contact me via MyChart or phone.

## 2021-10-06 NOTE — Progress Notes (Addendum)
MFM Brief Note  Julie Brooks is a 36 yo G5P2 who is seen at 41 w 2 d with an EDD of 02/27/22. She is seen at the request of Warner Mccreedy, MD   Julie Brooks is doing well without complaints. Her pregnancy is complicated by T2 DM, AMA and prior prterm birth.  She has a LR NIPS and negative AFP.  Single intrauterine pregnancy here for a detailed anatomy due to the above noted indications.  Normal anatomy with measurements consistent with FGR with an EFW 7th%.  There is good fetal movement and amniotic fluid volume Suboptimal views of the fetal anatomy were obtained secondary to fetal position. The UA Dopplers are normal without evidence of AEDF or REDF.   T2DM I reviewed today's study and recommend serial growth, fetal echocardiogram ( scheduled on 09/30/21)  and initiation of weekly testing at 32 weeks. We discussed the increased risk for fetal macrosomia, cardiac defects, maternal and fetal birth trauma with temporary and/or permenant damage, stillbirth and neonatal ICU admission.  We also discussed blood sugar goals of FBS < 95 and 2hr PP < 120 mg/dL. She reports that she is compliant with this goal.   In addition we discussed starting daily low dose ASA for the prevention of preeclampsia.   Placenta Previa In addition discussed the finding of Julie Brooks is a here for a detailed examination with a known placenta previa  There was normal anatomy with measurements consistent with dates. We observed good fetal movement and amniotic fluid volume.  A placenta previa was observed with ~2  cm of placental edge covering the internal os.  We discussed the fact that placenta previa is associated with an increased risk of bleeding, inpatient stay, emergency cesarean delivery, preterm birth, and bleeding precautions were reviewed. We advised pelvic rest. Should this condition fail to resolve, we discussed the necessity of cesarean delivery. An anterior low-lying placenta in the setting of a prior cesarean  delivery is associated with an increased risk of invasive placentation.   This risk is increased further in the setting of multiple cesarean deliveries.  Today's sonogram demonstrates no evidence of invasive placentation.   A follow up exam is recommended in  4 weeks to complete the fetal anatomy. Should invasive placentation be uncertain an MRI may be considered.   FGR  I discussed today's visit with a diagnosis of IUGR. I explained that the etiology includes placental insufficiency, chronic disease, infection, aneuploidy and other genetic syndromes. She has a low risk NIPS and neg horizon. She has no additional risk factors for chronic disease.   At this time I explained the diagnosis, evaluation and management to include on going fetal growth and  antenatal testing to include UA Dopplers.  We will reevaluate the fetal growth and dopplers at the next study.  If the EFW < 3rd% or abnormal testing, I recommend delivery at 37 weeks otherwise if all is normal consider delivery at 39 weeks.   All questions were answered  I spent 30 minutes with > 50% in face to face consulation with an interpreter.   Novella Olive, MD

## 2021-10-07 ENCOUNTER — Other Ambulatory Visit: Payer: Self-pay | Admitting: *Deleted

## 2021-10-07 ENCOUNTER — Telehealth: Payer: Self-pay | Admitting: Registered"

## 2021-10-07 MED ORDER — OMNIPOD 5 DEXG7G6 PODS GEN 5 MISC: 1.0000 | 5 refills | Status: DC

## 2021-10-07 NOTE — Telephone Encounter (Signed)
[facility] Interpreter [place] [phone]  Called patient after starting Omnipod pump yesterday. Pt states the pod is working well and her blood sugar is about the same as when using NPH and R insulin. Pt states she stopped using NPH and R and is just using the pump.  Pt states she does not understand how to give herself a bolus using the controller. Pt agreed to come in for another appointment on Monday a 1 pm at the [facility] location.

## 2021-10-09 ENCOUNTER — Encounter: Payer: Self-pay | Admitting: Obstetrics and Gynecology

## 2021-10-09 DIAGNOSIS — O365990 Maternal care for other known or suspected poor fetal growth, unspecified trimester, not applicable or unspecified: Secondary | ICD-10-CM | POA: Insufficient documentation

## 2021-10-10 ENCOUNTER — Ambulatory Visit: Payer: Managed Care, Other (non HMO) | Admitting: Registered"

## 2021-10-11 ENCOUNTER — Telehealth: Payer: Self-pay | Admitting: Registered"

## 2021-10-11 NOTE — Telephone Encounter (Signed)
Pt was scheduled for in-person visit to follow-up after starting Omnipod insulin pump last week. Called pt using an interpreter. Pt states she thought since she was scheduled to come in later in the week she didn't need to come in today. Pt states she replaced pod without issue and her blood sugar is about the same as it was with using daily injections. Pt states she does not understand how to use the bolus function for meals. Pt states her blood sugar is okay, but could not give me a specific number. Her daughter brought her the phone and she was in the shower. [facility] will need to just wait until 8/24 visit to review her BG log and to review how to use the controller to bolus insulin for meals.

## 2021-10-12 ENCOUNTER — Telehealth: Payer: Self-pay | Admitting: Registered"

## 2021-10-12 ENCOUNTER — Telehealth: Payer: Self-pay | Admitting: General Practice

## 2021-10-12 NOTE — Telephone Encounter (Signed)
Patient called into front office with concerns about her high blood sugar levels and she isn't feeling well. Per chart review, patient talked with Marylene Land yesterday regarding her sugars and has an appt with her tomorrow.   Called patient back with Eda assisting with Spanish interpretation and patient states since her phone call with Marylene Land yesterday her blood sugars have become out of control and are in the 200s no matter what she eats. Patient wants to know if she can inject herself with additional insulin because the pump isn't helping. Told patient I have reached out to [employer] with her concerns and she will call her back in a little bit. Patient verbalized understanding.

## 2021-10-12 NOTE — Telephone Encounter (Signed)
Placed call using [facility] #[phone]  RD tried to reach patient because pt told RN today that her blood sugar is high and is feeling bad and wants to know if she can inject insulin to help bring it down.   LVM I will call in the morning regarding her concern over her high blood sugar.

## 2021-10-13 ENCOUNTER — Other Ambulatory Visit: Payer: Self-pay

## 2021-10-13 ENCOUNTER — Encounter: Payer: Managed Care, Other (non HMO) | Attending: Obstetrics and Gynecology | Admitting: Registered"

## 2021-10-13 ENCOUNTER — Telehealth: Payer: Self-pay | Admitting: Registered"

## 2021-10-13 ENCOUNTER — Ambulatory Visit (INDEPENDENT_AMBULATORY_CARE_PROVIDER_SITE_OTHER): Payer: Managed Care, Other (non HMO) | Admitting: Registered"

## 2021-10-13 DIAGNOSIS — O24111 Pre-existing diabetes mellitus, type 2, in pregnancy, first trimester: Secondary | ICD-10-CM

## 2021-10-13 NOTE — Progress Notes (Signed)
[facility] video interpreter Madaline Guthrie [phone]  T2DM in pregnancy EDD: 02/27/22, [redacted]w[redacted]d  Pt was seen for follow-up after Omnipod start Start 1630  end 1713  Pt states she is not comfortable using carb counting and the bolus calculator function of Omnipod 5. Pt used humalog to inject 2 u for a meal earlier today and 2-hr PPBG was within range (107 mg/dL).  Pt states she does not want to use the bolus calculator function and does not want to connect CGM to pump.  Pt states the Omnipod pump stays attached to skin without issues, but the Dexcom comes off before the 10-day time period.   RD called Pt pharmacy to see why she couldn't get Accu chek test strips filled. Pharmacy ran Rx and found that her insurance formulary covers One Touch instead of Accu chek and updated her Rx to the covered glucometer.  Topics covered today: Bolus feature on pump controller to deliver 2 u of insulin with meals Placement options for Dexcom G6 Overpatch for Dexcom G6 (pt ordered from [employer]) Steps to get new glucometer Lag time for CGM  Plan: Pt used controller to practice putting in 2 unit bolus and demonstrated understanding of "confirm" and "cancel" on bottom of screen. Patient will start using controller for meal time bolus  Pt will be seen Monday, Aug 28 at 10:45 to receive sample meter that her insurance covers and will receive instruction at that time.

## 2021-10-13 NOTE — Telephone Encounter (Signed)
Pt has appt at 4:15 this afternoon. Pt was contacted this morning to answer her question about injecting insulin to help reduce blood sugar. Pt was instructed to use 2 units of insulin prior to meals. Pt verbalized understanding and confirmed appt at 4:15 and will evaluate dose of insulin for meals at that time.

## 2021-10-17 ENCOUNTER — Encounter: Payer: Self-pay | Admitting: Registered"

## 2021-10-17 ENCOUNTER — Encounter: Payer: Managed Care, Other (non HMO) | Admitting: Registered"

## 2021-10-17 DIAGNOSIS — O24112 Pre-existing diabetes mellitus, type 2, in pregnancy, second trimester: Secondary | ICD-10-CM

## 2021-10-17 NOTE — Progress Notes (Signed)
In-person interpreter Bobbe Medico from [facility] assisted with phone visit.  Omnipod follow-up Patient with T2D in pregnancy   Call start [phone]  Call end [phone]  20 min total time spent on phone  Pt called instead of coming in for her visit because she picked up all her supplies from the pharmacy and thought today's visit was just to learn how to use new meter, and didn't need instruction. Pt states she does not want to come in today because today was her daughter's first day at school and wants to be available to her if needed.  Pt states her blood sugar has been much better since she started using 2 units of insulin with her meals. Pt states she is not using insulin with snacks but will start. Pt reports on Sunday she ate a piece of cake (BG 211) without bolus insulin. States she does not remember if what she ate or if took insulin last night (BG 180). Pt states this morning she had [employer] chicken biscuit and a few sips of a sweet coffee. Pt states did not take insulin because she was not planning to eat until she got home.  RD stressed the importance of her keeping her appointments with me because adjustments to her insulin pump are needed especially in the beginning.     Pt will be seen in-person this week for BG review and pump settings adjustments.  Pt voiced understanding and did not have further questions.

## 2021-10-17 NOTE — Patient Instructions (Signed)
Return on Thurs, 8/31 at 4:15 for omnipod basal rate review and update settings if needed.

## 2021-10-20 ENCOUNTER — Encounter: Payer: Self-pay | Admitting: Registered"

## 2021-10-20 NOTE — Progress Notes (Signed)
In-person interpreter Eda present for visit. Pt knows enough English to understand most of appointment.  Patient returned for follow-up 10/20/21  Omnipod start 10/06/2021    Pt states her skin feels hard around the area where she is wearing the pump. Discussed alternate sites to wear the pump and proximity to Dexcom (at least 3" away)  Plan: Increase basal insulin by 10% New rate is 0.55 u/hr Continue 2 u insulin with meals Start using 1 u insulin with snacks   Return for follow-up in 1 week.

## 2021-10-21 ENCOUNTER — Ambulatory Visit (INDEPENDENT_AMBULATORY_CARE_PROVIDER_SITE_OTHER): Payer: Managed Care, Other (non HMO) | Admitting: Family Medicine

## 2021-10-21 ENCOUNTER — Other Ambulatory Visit: Payer: Self-pay

## 2021-10-21 VITALS — BP 107/70 | HR 96 | Wt 180.0 lb

## 2021-10-21 DIAGNOSIS — Z3A21 21 weeks gestation of pregnancy: Secondary | ICD-10-CM

## 2021-10-21 DIAGNOSIS — Z23 Encounter for immunization: Secondary | ICD-10-CM | POA: Diagnosis not present

## 2021-10-21 DIAGNOSIS — O365920 Maternal care for other known or suspected poor fetal growth, second trimester, not applicable or unspecified: Secondary | ICD-10-CM

## 2021-10-21 DIAGNOSIS — O4402 Placenta previa specified as without hemorrhage, second trimester: Secondary | ICD-10-CM

## 2021-10-21 DIAGNOSIS — Z789 Other specified health status: Secondary | ICD-10-CM

## 2021-10-21 DIAGNOSIS — Z8751 Personal history of pre-term labor: Secondary | ICD-10-CM

## 2021-10-21 DIAGNOSIS — O09522 Supervision of elderly multigravida, second trimester: Secondary | ICD-10-CM

## 2021-10-21 DIAGNOSIS — O0990 Supervision of high risk pregnancy, unspecified, unspecified trimester: Secondary | ICD-10-CM

## 2021-10-21 NOTE — Progress Notes (Signed)
   PRENATAL VISIT NOTE  Subjective:  Julie Brooks is a 36 y.o. [phone] at [redacted]w[redacted]d being seen today for ongoing prenatal care.  She is currently monitored for the following issues for this high-risk pregnancy and has History of preterm delivery; Language barrier; Supervision of high risk pregnancy, antepartum; AMA (advanced maternal age) multigravida 35+; Obesity affecting pregnancy; Pre-existing type 2 diabetes mellitus during pregnancy in first trimester; Poorly controlled diabetes mellitus (HCC); BMI 32.0-32.9,adult; Lumbago with sciatica; Hypertriglyceridemia; Placenta previa antepartum in second trimester; and IUGR (intrauterine growth restriction) affecting care of mother on their problem list.  Patient reports no complaints.  Contractions: Not present. Vag. Bleeding: None.  Movement: Present. Denies leaking of fluid.   The following portions of the patient's history were reviewed and updated as appropriate: allergies, current medications, past family history, past medical history, past social history, past surgical history and problem list.   Objective:   Vitals:   10/21/21 1057  BP: 107/70  Pulse: 96  Weight: 180 lb (81.6 kg)    Fetal Status: Fetal Heart Rate (bpm): 145   Movement: Present     General:  Alert, oriented and cooperative. Patient is in no acute distress.  Skin: Skin is warm and dry. No rash noted.   Cardiovascular: Normal heart rate noted  Respiratory: Normal respiratory effort, no problems with respiration noted  Abdomen: Soft, gravid, appropriate for gestational age.  Pain/Pressure: Present     Pelvic: Cervical exam deferred        Extremities: Normal range of motion.  Edema: None  Mental Status: Normal mood and affect. Normal behavior. Normal judgment and thought content.   Assessment and Plan:  Pregnancy: [id number] at [redacted]w[redacted]d 1. Supervision of high risk pregnancy, antepartum   2. Multigravida of advanced maternal age in second trimester LR NIPT  3. Poor  fetal growth affecting management of mother in second trimester, single or unspecified fetus Last u/s showed baby at the 7%. To follow--  4. Placenta previa antepartum in second trimester Pelvic rest--discussed bleeding precautions.  5. Language barrier Spanish interpreter: in person used  6. History of preterm delivery   7. Need for immunization against influenza - Flu Vaccine QUAD 36mo+IM (Fluarix, Fluzone & Alfiuria Quad PF)  Preterm labor symptoms and general obstetric precautions including but not limited to vaginal bleeding, contractions, leaking of fluid and fetal movement were reviewed in detail with the patient. Please refer to After Visit Summary for other counseling recommendations.   Return in 2 weeks (on 11/04/2021) for [facility].  Future Appointments  Date Time Provider Department Center  10/27/2021 11:15 AM [facility]  11/03/2021  3:15 PM [facility] NURSE [facility]  11/03/2021  3:30 PM [facility] US2 [facility]  11/07/2021  1:15 PM Milas Hock, MD [facility]    Reva Bores, MD

## 2021-10-27 ENCOUNTER — Other Ambulatory Visit: Payer: Managed Care, Other (non HMO)

## 2021-10-31 ENCOUNTER — Encounter: Payer: Self-pay | Admitting: Family Medicine

## 2021-11-02 ENCOUNTER — Ambulatory Visit: Payer: No Typology Code available for payment source

## 2021-11-03 ENCOUNTER — Ambulatory Visit: Payer: Managed Care, Other (non HMO) | Admitting: *Deleted

## 2021-11-03 ENCOUNTER — Other Ambulatory Visit: Payer: Self-pay | Admitting: Maternal & Fetal Medicine

## 2021-11-03 ENCOUNTER — Ambulatory Visit: Payer: Managed Care, Other (non HMO) | Attending: Maternal & Fetal Medicine

## 2021-11-03 DIAGNOSIS — E119 Type 2 diabetes mellitus without complications: Secondary | ICD-10-CM

## 2021-11-03 DIAGNOSIS — Z362 Encounter for other antenatal screening follow-up: Secondary | ICD-10-CM

## 2021-11-03 DIAGNOSIS — O24112 Pre-existing diabetes mellitus, type 2, in pregnancy, second trimester: Secondary | ICD-10-CM | POA: Diagnosis not present

## 2021-11-03 DIAGNOSIS — O4402 Placenta previa specified as without hemorrhage, second trimester: Secondary | ICD-10-CM | POA: Insufficient documentation

## 2021-11-03 DIAGNOSIS — Z7984 Long term (current) use of oral hypoglycemic drugs: Secondary | ICD-10-CM

## 2021-11-03 DIAGNOSIS — Z794 Long term (current) use of insulin: Secondary | ICD-10-CM

## 2021-11-03 DIAGNOSIS — O365990 Maternal care for other known or suspected poor fetal growth, unspecified trimester, not applicable or unspecified: Secondary | ICD-10-CM | POA: Insufficient documentation

## 2021-11-03 DIAGNOSIS — Z3A23 23 weeks gestation of pregnancy: Secondary | ICD-10-CM

## 2021-11-03 DIAGNOSIS — O99212 Obesity complicating pregnancy, second trimester: Secondary | ICD-10-CM

## 2021-11-03 DIAGNOSIS — E669 Obesity, unspecified: Secondary | ICD-10-CM

## 2021-11-03 DIAGNOSIS — O365920 Maternal care for other known or suspected poor fetal growth, second trimester, not applicable or unspecified: Secondary | ICD-10-CM | POA: Diagnosis not present

## 2021-11-03 DIAGNOSIS — O09529 Supervision of elderly multigravida, unspecified trimester: Secondary | ICD-10-CM

## 2021-11-03 NOTE — Progress Notes (Signed)
Pt feeling sharp stabbing like pain on her right lower abd, as well as pressure on her labias over the past 3 days while working 12 hour shifts.

## 2021-11-04 ENCOUNTER — Other Ambulatory Visit: Payer: Self-pay | Admitting: *Deleted

## 2021-11-04 DIAGNOSIS — O365990 Maternal care for other known or suspected poor fetal growth, unspecified trimester, not applicable or unspecified: Secondary | ICD-10-CM

## 2021-11-04 DIAGNOSIS — O4402 Placenta previa specified as without hemorrhage, second trimester: Secondary | ICD-10-CM

## 2021-11-04 NOTE — Progress Notes (Unsigned)
   PRENATAL VISIT NOTE  Subjective:  Julie Brooks is a 36 y.o. [id number] at [redacted]w[redacted]d being seen today for ongoing prenatal care.  She is currently monitored for the following issues for this high-risk pregnancy and has History of preterm delivery; Language barrier; Supervision of high risk pregnancy, antepartum; AMA (advanced maternal age) multigravida 35+; Obesity affecting pregnancy; Pre-existing type 2 diabetes mellitus during pregnancy in first trimester; Poorly controlled diabetes mellitus (HCC); BMI 32.0-32.9,adult; Lumbago with sciatica; Hypertriglyceridemia; Placenta previa antepartum in second trimester; and IUGR (intrauterine growth restriction) affecting care of mother on their problem list.  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  .  Contractions: Not present. Vag. Bleeding: None.  Movement: Present. Denies leaking of fluid.   The following portions of the patient's history were reviewed and updated as appropriate: allergies, current medications, past family history, past medical history, past social history, past surgical history and problem list.   Objective:   Vitals:   11/07/21 1317  BP: 113/75  Pulse: 99  Weight: 178 lb 9.6 oz (81 kg)    Fetal Status: Fetal Heart Rate (bpm): 140   Movement: Present     General:  Alert, oriented and cooperative. Patient is in no acute distress.  Skin: Skin is warm and dry. No rash noted.   Cardiovascular: Normal heart rate noted  Respiratory: Normal respiratory effort, no problems with respiration noted  Abdomen: Soft, gravid, appropriate for gestational age.  Pain/Pressure: Absent     Pelvic: Cervical exam deferred        Extremities: Normal range of motion.  Edema: None  Mental Status: Normal mood and affect. Normal behavior. Normal judgment and thought content.   Assessment and Plan:  Pregnancy: [id number] at [redacted]w[redacted]d 1. Pre-existing type 2 diabetes  mellitus during pregnancy in first trimester Current regimen: Insulin via omniopod and MTF CBGs: 80-90s fastings with some in the high 90s-100s. Her meals are almost all normal.  Continue current Omnipod.  Normal fetal echo Continue serial growth scans  2. History of preterm delivery 34-36w in 2008 and 2019  3. Language barrier Spanish interpreter used throughout.   4. Supervision of high risk pregnancy, antepartum Routine PNC up to date.  S/p flu shot  Will need CBC/RPR/HIV at next appt Work note given.   5. Multigravida of advanced maternal age in second trimester LR NIPS, otherwise normal detailed anatomy  6. Placenta previa antepartum in second trimester Posterior; continue pelvic rest  7. Poor fetal growth affecting management of mother in second trimester, single or unspecified fetus Was 7%ile with normal AC (40%). Primarily due to [facility]. Normal UADs. Continue serial growths per MFM.    Preterm labor symptoms and general obstetric precautions including but not limited to vaginal bleeding, contractions, leaking of fluid and fetal movement were reviewed in detail with the patient. Please refer to After Visit Summary for other counseling recommendations.   Return in about 4 weeks (around 12/05/2021) for OB VISIT, MD or APP.  Future Appointments  Date Time Provider Department Center  11/17/2021 12:30 PM [facility] NURSE [facility]  11/17/2021 12:45 PM [facility] US5 [facility]  11/21/2021 11:15 AM Hermina Staggers, MD [facility]  11/24/2021 12:30 PM [facility] NURSE [facility]  11/24/2021 12:45 PM [facility] US5 [facility]    Milas Hock, MD

## 2021-11-07 ENCOUNTER — Other Ambulatory Visit: Payer: Self-pay

## 2021-11-07 ENCOUNTER — Ambulatory Visit (INDEPENDENT_AMBULATORY_CARE_PROVIDER_SITE_OTHER): Payer: Managed Care, Other (non HMO) | Admitting: Obstetrics and Gynecology

## 2021-11-07 ENCOUNTER — Encounter: Payer: Self-pay | Admitting: Family Medicine

## 2021-11-07 ENCOUNTER — Encounter: Payer: Self-pay | Admitting: Obstetrics and Gynecology

## 2021-11-07 VITALS — BP 113/75 | HR 99 | Wt 178.6 lb

## 2021-11-07 DIAGNOSIS — O0990 Supervision of high risk pregnancy, unspecified, unspecified trimester: Secondary | ICD-10-CM

## 2021-11-07 DIAGNOSIS — O09522 Supervision of elderly multigravida, second trimester: Secondary | ICD-10-CM

## 2021-11-07 DIAGNOSIS — Z789 Other specified health status: Secondary | ICD-10-CM

## 2021-11-07 DIAGNOSIS — O365920 Maternal care for other known or suspected poor fetal growth, second trimester, not applicable or unspecified: Secondary | ICD-10-CM

## 2021-11-07 DIAGNOSIS — O24111 Pre-existing diabetes mellitus, type 2, in pregnancy, first trimester: Secondary | ICD-10-CM

## 2021-11-07 DIAGNOSIS — O4402 Placenta previa specified as without hemorrhage, second trimester: Secondary | ICD-10-CM

## 2021-11-07 DIAGNOSIS — Z8751 Personal history of pre-term labor: Secondary | ICD-10-CM

## 2021-11-14 ENCOUNTER — Telehealth: Payer: Self-pay | Admitting: Registered"

## 2021-11-14 NOTE — Telephone Encounter (Signed)
Pt states she needs more Omnipods and the pharmacy said we need to call them in. She also said hasn't been able to get Dexcom sensors either. I told her I would let the clinical staff know she needs both prescriptions looked into.  Pt states since her blood sugar has been mostly within range she does not feel the need for a follow-up visit with dietitian (me)  [place] [phone] assisted

## 2021-11-15 ENCOUNTER — Other Ambulatory Visit: Payer: Self-pay

## 2021-11-15 MED ORDER — DEXCOM G6 SENSOR MISC: 6 refills | Status: AC

## 2021-11-15 NOTE — Telephone Encounter (Signed)
Reorder of Dexcom sensors. Spoke with [employer]. Had Sensors with 5 refills remaining and Omnipod Pods were needing insurance verification. Rx went through. Will call pt to let know update.   Call placed with pt with interpreter Claudia. Spoke with pt. Pt given update on Rx refills. Pt verbalized understanding.  Colletta [place], RNC

## 2021-11-15 NOTE — Progress Notes (Signed)
Reorder of Dexcom sensors. Spoke with [facility]. Had Sensors with 5 refills remaining and Omnipod Pods were needing insurance verification. Rx went through. Will call pt to let know update.   Call placed with pt with interpreter Claudia. Spoke with pt. Pt given update on Rx refills. Pt verbalized understanding.  Waverley Krempasky, RNC 

## 2021-11-17 ENCOUNTER — Ambulatory Visit: Payer: Managed Care, Other (non HMO) | Attending: Obstetrics and Gynecology

## 2021-11-17 ENCOUNTER — Ambulatory Visit: Payer: Managed Care, Other (non HMO) | Admitting: *Deleted

## 2021-11-17 VITALS — BP 104/56 | HR 95

## 2021-11-17 DIAGNOSIS — Z3A25 25 weeks gestation of pregnancy: Secondary | ICD-10-CM

## 2021-11-17 DIAGNOSIS — O365920 Maternal care for other known or suspected poor fetal growth, second trimester, not applicable or unspecified: Secondary | ICD-10-CM

## 2021-11-17 DIAGNOSIS — O365990 Maternal care for other known or suspected poor fetal growth, unspecified trimester, not applicable or unspecified: Secondary | ICD-10-CM | POA: Insufficient documentation

## 2021-11-17 DIAGNOSIS — O99212 Obesity complicating pregnancy, second trimester: Secondary | ICD-10-CM | POA: Diagnosis present

## 2021-11-17 DIAGNOSIS — O4402 Placenta previa specified as without hemorrhage, second trimester: Secondary | ICD-10-CM | POA: Diagnosis present

## 2021-11-17 DIAGNOSIS — E119 Type 2 diabetes mellitus without complications: Secondary | ICD-10-CM

## 2021-11-17 DIAGNOSIS — O24112 Pre-existing diabetes mellitus, type 2, in pregnancy, second trimester: Secondary | ICD-10-CM

## 2021-11-17 DIAGNOSIS — O09522 Supervision of elderly multigravida, second trimester: Secondary | ICD-10-CM | POA: Diagnosis present

## 2021-11-17 DIAGNOSIS — O0990 Supervision of high risk pregnancy, unspecified, unspecified trimester: Secondary | ICD-10-CM | POA: Diagnosis present

## 2021-11-17 DIAGNOSIS — E669 Obesity, unspecified: Secondary | ICD-10-CM

## 2021-11-17 DIAGNOSIS — O09212 Supervision of pregnancy with history of pre-term labor, second trimester: Secondary | ICD-10-CM

## 2021-11-21 ENCOUNTER — Ambulatory Visit (INDEPENDENT_AMBULATORY_CARE_PROVIDER_SITE_OTHER): Payer: Commercial Managed Care - HMO | Admitting: Obstetrics and Gynecology

## 2021-11-21 ENCOUNTER — Encounter: Payer: Self-pay | Admitting: Obstetrics and Gynecology

## 2021-11-21 ENCOUNTER — Other Ambulatory Visit: Payer: Self-pay

## 2021-11-21 ENCOUNTER — Other Ambulatory Visit (HOSPITAL_COMMUNITY)
Admission: RE | Admit: 2021-11-21 | Discharge: 2021-11-21 | Disposition: A | Discharge status: Home / Self Care | Payer: Commercial Managed Care - HMO | Source: Ambulatory Visit | Attending: Obstetrics and Gynecology | Admitting: Obstetrics and Gynecology

## 2021-11-21 VITALS — BP 118/69 | HR 108 | Wt 180.5 lb

## 2021-11-21 DIAGNOSIS — O0990 Supervision of high risk pregnancy, unspecified, unspecified trimester: Secondary | ICD-10-CM | POA: Diagnosis present

## 2021-11-21 DIAGNOSIS — O24111 Pre-existing diabetes mellitus, type 2, in pregnancy, first trimester: Secondary | ICD-10-CM

## 2021-11-21 DIAGNOSIS — R102 Pelvic and perineal pain: Secondary | ICD-10-CM | POA: Diagnosis present

## 2021-11-21 DIAGNOSIS — O4402 Placenta previa specified as without hemorrhage, second trimester: Secondary | ICD-10-CM

## 2021-11-21 DIAGNOSIS — Z789 Other specified health status: Secondary | ICD-10-CM

## 2021-11-21 DIAGNOSIS — O09522 Supervision of elderly multigravida, second trimester: Secondary | ICD-10-CM

## 2021-11-21 DIAGNOSIS — O365920 Maternal care for other known or suspected poor fetal growth, second trimester, not applicable or unspecified: Secondary | ICD-10-CM

## 2021-11-21 DIAGNOSIS — Z8751 Personal history of pre-term labor: Secondary | ICD-10-CM

## 2021-11-21 NOTE — Progress Notes (Signed)
Pt reports pain in Vagina, with clear ,yellowish tint when she wipes. Pt did  not bring Glucose log today.

## 2021-11-21 NOTE — Patient Instructions (Signed)
Tercer trimestre de embarazo Third Trimester of Pregnancy  El tercer trimestre de embarazo va desde la semana 28 hasta la semana 40. Tambin se dice que va desde el mes 7 hasta el mes 9. En este trimestre, el beb en gestacin (feto) crece muy rpidamente. Hacia el final del noveno mes, el beb en gestacin mide alrededor de 20 pulgadas (45 cm) de largo. Pesa entre 6 y 10 libras (2,70 y 4,50 kg). Cambios en el cuerpo durante el tercer trimestre Su organismo contina atravesando por muchos cambios durante este perodo. Los cambios varan y generalmente vuelven a la normalidad despus del nacimiento del beb. Cambios fsicos Seguir aumentando de peso. Puede ser que aumente entre 25 y 35 libras (11 y 16 kg) hacia el final del embarazo. Si tiene bajo peso, puede aumentar entre 28 y 40 lb (unos 13 a 18 kg). Si tiene sobrepeso, puede aumentar entre 15 y 25 libras (unos 7 a 11 kg). Podrn aparecer las primeras estras en las caderas, el vientre (abdomen) y las mamas. Las mamas seguirn creciendo y pueden doler. Un lquido amarillo (calostro) puede salir de sus pechos. Esta es la primera leche que usted produce para el beb. Tal vez haya cambios en el cabello. El ombligo puede salir hacia afuera. Puede observar que se le hinchan ms las manos, la cara o los tobillos. Cambios en la salud Es posible que tenga acidez estomacal. Es posible que tenga dificultades para defecar (estreimiento). Pueden aparecerle hemorroides. Estas son venas hinchadas en el ano que pueden picar o doler. Puede comenzar a tener venas hinchadas (vrices) en las piernas. Puede presentar ms dolor en la pelvis, la espalda o los muslos. Puede presentar ms hormigueo o entumecimiento en las manos, los brazos y las piernas. La piel de su vientre tambin puede sentirse entumecida. Es posible que sienta falta de aire a medida que el tero se agranda. Otros cambios Es posible que haga pis (orine) con mayor frecuencia. Puede tener ms  problemas para dormir. Puede notar que el beb en gestacin "baja" o se mueve ms hacia bajo, en el vientre. Puede notar ms secrecin proveniente de la vagina. Puede sentir las articulaciones flojas y puede sentir dolor alrededor del hueso plvico. Siga estas instrucciones en su casa: Medicamentos Use los medicamentos de venta libre y los recetados solamente como se lo haya indicado el mdico. Algunos medicamentos no son seguros durante el embarazo. Tome vitaminas prenatales que contengan por lo menos 600 microgramos (mcg) de cido flico. Comida y bebida Consuma comidas saludables que incluyan lo siguiente: Frutas y verduras frescas. Cereales integrales. Buenas fuentes de protenas, como carne, huevos y tofu. Productos lcteos con bajo contenido de grasa. Evite la carne cruda y el jugo, la leche y el queso sin pasteurizar. Estos portan grmenes que pueden provocar dao tanto a usted como al beb. Tome 4 o 5 comidas pequeas en lugar de 3 comidas abundantes al da. Es posible que deba tomar medidas para prevenir o tratar los problemas para defecar: Beber suficiente lquido para mantener el pis (orina) de color amarillo plido. Come alimentos ricos en fibra. Entre ellos, frijoles, cereales integrales y frutas y verduras frescas. Limitar los alimentos con alto contenido de grasa y azcar. Estos incluyen alimentos fritos o dulces. Actividad Haga ejercicios solamente como se lo haya indicado el mdico. Interrumpa la actividad fsica si comienza a tener clicos en el tero. Evite levantar pesos excesivos. No haga ejercicio si hace demasiado calor, hay demasiada humedad o se encuentra en un lugar de mucha   altura (altitud elevada). Si lo desea, puede continuar teniendo relaciones sexuales, a menos que el mdico le indique lo contrario. Alivio del dolor y del malestar Haga pausas con frecuencia y descanse con las piernas levantadas (elevadas) si tiene calambres en las piernas o dolor en la parte  baja de la espalda. Dese baos de asiento con agua tibia para aliviar el dolor o las molestias causadas por las hemorroides. Use una crema para las hemorroides si el mdico la autoriza. Use un sostn que le brinde buen soporte si sus mamas estn sensibles. Si desarrolla venas hinchadas y abultadas en las piernas: Use medias de compresin segn las indicaciones de su mdico. Levante los pies durante 15 minutos, 3 o 4 veces por da. Limite la sal en sus alimentos. Seguridad Hable con el mdico antes de recorrer largas distancias. No se d baos de inmersin en agua caliente, baos turcos ni saunas. Use el cinturn de seguridad en todo momento mientras vaya en auto. Hable con el mdico si alguien le est haciendo dao o gritando mucho. Preparacin para la llegada del beb Para prepararse para la llegada de su beb: Tome clases prenatales. Visite el hospital y recorra el rea de maternidad. Compre un asiento de seguridad orientado hacia atrs para llevar al beb en el automvil. Aprenda cmo instalarlo en el auto. Prepare la habitacin del beb. Saque todas las almohadas y los animales de peluche de la cuna del beb. Instrucciones generales Evite el contacto con las bandejas sanitarias de los gatos y la tierra que estos animales usan. Estos contienen grmenes que pueden daar al beb y causar la prdida del beb ya sea aborto espontneo o muerte fetal. No se haga duchas vaginales ni use tampones. No use tampones ni toallas higinicas perfumadas. No fume ni consuma ningn producto que contenga nicotina o tabaco. Si necesita ayuda para dejar de fumar, consulte al mdico. No beba alcohol. No use medicamentos a base de hierbas, drogas ilegales, ni medicamentos que el mdico no haya autorizado. Las sustancias qumicas de estos productos pueden afectar al beb. Cumpla con todas las visitas de seguimiento. Esto es importante. Dnde buscar ms informacin American Pregnancy Association (Asociacin  Americana del Embarazo): americanpregnancy.org American College of Obstetricians and Gynecologists (Colegio Estadounidense de Obstetras y Gineclogos): www.acog.org Office on Women's Health (Oficina para la Salud de la Mujer): womenshealth.gov/pregnancy Comunquese con un mdico si: Tiene fiebre. Tiene clicos leves o siente presin en la parte baja del vientre. Sufre un dolor persistente en el abdomen. Vomita o hace deposiciones acuosas (diarrea). Advierte lquido con mal olor que proviene de la vagina. Siente dolor al orinar o hace orina con mal olor. Tiene un dolor de cabeza que no desaparece despus de tomar analgsicos. Nota cambios en la visin o ve manchas delante de los ojos. Solicite ayuda de inmediato si: Rompe la bolsa. Tiene contracciones regulares separadas por menos de 5 minutos. Tiene sangrado o pequeas prdidas vaginales. Tiene clicos o dolor muy intensos en el vientre. Tiene dificultad para respirar. Sientes dolor en el pecho. Se desmaya. No ha sentido al beb moverse durante el tiempo que le indic el mdico. Tiene dolor, hinchazn o enrojecimiento nuevos en un brazo o una pierna o se produce un aumento de alguno de estos sntomas. Resumen El tercer trimestre comprende desde la semana 28 hasta la semana 40 (desde el mes 7 hasta el mes 9). Esta es la poca en que el beb en gestacin crece muy rpidamente. Durante este perodo, las molestias pueden aumentar a medida que usted   sube de peso y el beb crece. Preprese para la llegada del beb: asista a las clases prenatales, compre un asiento de seguridad orientado hacia atrs para llevar al beb en auto y prepare la habitacin del beb. Solicite ayuda de inmediato si tiene sangrado por la vagina, siente dolor en el pecho y tiene dificultad para respirar, o si no ha sentido al beb moverse durante el tiempo que le indic el mdico. Esta informacin no tiene como fin reemplazar el consejo del mdico. Asegrese de hacerle al  mdico cualquier pregunta que tenga. Document Revised: 08/20/2019 Document Reviewed: 08/20/2019 Elsevier Patient Education  2023 Elsevier Inc.  

## 2021-11-21 NOTE — Progress Notes (Signed)
Subjective:  Julie Brooks is a 36 y.o. [phone] at [redacted]w[redacted]d being seen today for ongoing prenatal care.  She is currently monitored for the following issues for this high-risk pregnancy and has History of preterm delivery; Language barrier; Supervision of high risk pregnancy, antepartum; AMA (advanced maternal age) multigravida 35+; Obesity affecting pregnancy; Pre-existing type 2 diabetes mellitus during pregnancy in first trimester; Poorly controlled diabetes mellitus ([place]); BMI 32.0-32.9,adult; Lumbago with sciatica; Hypertriglyceridemia; Placenta previa antepartum in second trimester; and IUGR (intrauterine growth restriction) affecting care of mother on their problem list.  Patient reports no complaints.  Contractions: Not present. Vag. Bleeding: None.  Movement: Present. Denies leaking of fluid.   The following portions of the patient's history were reviewed and updated as appropriate: allergies, current medications, past family history, past medical history, past social history, past surgical history and problem list. Problem list updated.  Objective:   Vitals:   11/21/21 1148  BP: 118/69  Pulse: (!) 108  Weight: 180 lb 8 oz (81.9 kg)    Fetal Status: Fetal Heart Rate (bpm): 156   Movement: Present     General:  Alert, oriented and cooperative. Patient is in no acute distress.  Skin: Skin is warm and dry. No rash noted.   Cardiovascular: Normal heart rate noted  Respiratory: Normal respiratory effort, no problems with respiration noted  Abdomen: Soft, gravid, appropriate for gestational age. Pain/Pressure: Absent     Pelvic:  Cervical exam deferred        Extremities: Normal range of motion.  Edema: None  Mental Status: Normal mood and affect. Normal behavior. Normal judgment and thought content.   Urinalysis:      Assessment and Plan:  Pregnancy: [id number] at [redacted]w[redacted]d  1. Supervision of high risk pregnancy, antepartum Stable - CBC - HIV Antibody (routine testing w rflx) -  RPR - Tdap vaccine greater than or equal to 7yo IM - Cervicovaginal ancillary only( [place])  2. Poor fetal growth affecting management of mother in second trimester, single or unspecified fetus Serial growth scans and antenatal testing as per MFM  3. Placenta previa antepartum in second trimester U/S as above Continue with pelvic rest Bleeding precautions reviewed with pt  4. Pre-existing type 2 diabetes mellitus during pregnancy in first trimester Reports CBG's in goal range Did not bring readings Continue with current management Serial growth scans and antenatal testing as per MFM  58. Multigravida of advanced maternal age in second trimester Stable  6. History of preterm delivery Stable  7. Language barrier Video interrupter used during today's visit  8. Vaginal pain  - Cervicovaginal ancillary only( [place])  Preterm labor symptoms and general obstetric precautions including but not limited to vaginal bleeding, contractions, leaking of fluid and fetal movement were reviewed in detail with the patient. Please refer to After Visit Summary for other counseling recommendations.  Return in about 2 weeks (around 12/05/2021) for OB visit, face to face, MD only.   Chancy Milroy, MD

## 2021-11-22 LAB — CERVICOVAGINAL ANCILLARY ONLY
Bacterial Vaginitis (gardnerella): NEGATIVE
Candida Glabrata: NEGATIVE
Candida Vaginitis: NEGATIVE
Chlamydia: NEGATIVE
Comment: NEGATIVE
Comment: NEGATIVE
Comment: NEGATIVE
Comment: NEGATIVE
Comment: NEGATIVE
Comment: NORMAL
Neisseria Gonorrhea: NEGATIVE
Trichomonas: NEGATIVE

## 2021-11-22 LAB — CBC
Hematocrit: 33.3 % — ABNORMAL LOW (ref 34.0–46.6)
Hemoglobin: 11.1 g/dL (ref 11.1–15.9)
MCH: 27.5 pg (ref 26.6–33.0)
MCHC: 33.3 g/dL (ref 31.5–35.7)
MCV: 83 fL (ref 79–97)
Platelets: 295 10*3/uL (ref 150–450)
RBC: 4.03 x10E6/uL (ref 3.77–5.28)
RDW: 13.3 % (ref 11.7–15.4)
WBC: 11.7 10*3/uL — ABNORMAL HIGH (ref 3.4–10.8)

## 2021-11-22 LAB — HIV ANTIBODY (ROUTINE TESTING W REFLEX): HIV Screen 4th Generation wRfx: NONREACTIVE

## 2021-11-22 LAB — RPR: RPR Ser Ql: NONREACTIVE

## 2021-11-24 ENCOUNTER — Other Ambulatory Visit: Payer: Self-pay | Admitting: *Deleted

## 2021-11-24 ENCOUNTER — Ambulatory Visit: Payer: Commercial Managed Care - HMO | Admitting: *Deleted

## 2021-11-24 ENCOUNTER — Other Ambulatory Visit: Payer: Commercial Managed Care - HMO

## 2021-11-24 ENCOUNTER — Ambulatory Visit: Payer: Commercial Managed Care - HMO | Attending: Obstetrics and Gynecology

## 2021-11-24 VITALS — BP 109/66 | HR 94

## 2021-11-24 DIAGNOSIS — Z3A26 26 weeks gestation of pregnancy: Secondary | ICD-10-CM

## 2021-11-24 DIAGNOSIS — O09212 Supervision of pregnancy with history of pre-term labor, second trimester: Secondary | ICD-10-CM

## 2021-11-24 DIAGNOSIS — O4402 Placenta previa specified as without hemorrhage, second trimester: Secondary | ICD-10-CM

## 2021-11-24 DIAGNOSIS — O24112 Pre-existing diabetes mellitus, type 2, in pregnancy, second trimester: Secondary | ICD-10-CM | POA: Diagnosis not present

## 2021-11-24 DIAGNOSIS — O99212 Obesity complicating pregnancy, second trimester: Secondary | ICD-10-CM

## 2021-11-24 DIAGNOSIS — O09522 Supervision of elderly multigravida, second trimester: Secondary | ICD-10-CM

## 2021-11-24 DIAGNOSIS — O0990 Supervision of high risk pregnancy, unspecified, unspecified trimester: Secondary | ICD-10-CM | POA: Insufficient documentation

## 2021-11-24 DIAGNOSIS — E669 Obesity, unspecified: Secondary | ICD-10-CM

## 2021-11-24 DIAGNOSIS — O365990 Maternal care for other known or suspected poor fetal growth, unspecified trimester, not applicable or unspecified: Secondary | ICD-10-CM | POA: Diagnosis present

## 2021-11-24 DIAGNOSIS — O24912 Unspecified diabetes mellitus in pregnancy, second trimester: Secondary | ICD-10-CM

## 2021-12-07 ENCOUNTER — Other Ambulatory Visit: Payer: Self-pay

## 2021-12-07 ENCOUNTER — Encounter: Payer: Self-pay | Admitting: Obstetrics & Gynecology

## 2021-12-07 ENCOUNTER — Ambulatory Visit (INDEPENDENT_AMBULATORY_CARE_PROVIDER_SITE_OTHER): Payer: Commercial Managed Care - HMO | Admitting: Obstetrics & Gynecology

## 2021-12-07 VITALS — BP 120/77 | HR 104 | Wt 182.0 lb

## 2021-12-07 DIAGNOSIS — Z23 Encounter for immunization: Secondary | ICD-10-CM

## 2021-12-07 DIAGNOSIS — O24113 Pre-existing diabetes mellitus, type 2, in pregnancy, third trimester: Secondary | ICD-10-CM

## 2021-12-07 DIAGNOSIS — O09523 Supervision of elderly multigravida, third trimester: Secondary | ICD-10-CM

## 2021-12-07 DIAGNOSIS — O4403 Placenta previa specified as without hemorrhage, third trimester: Secondary | ICD-10-CM

## 2021-12-07 DIAGNOSIS — Z3A31 31 weeks gestation of pregnancy: Secondary | ICD-10-CM | POA: Diagnosis not present

## 2021-12-07 DIAGNOSIS — O0990 Supervision of high risk pregnancy, unspecified, unspecified trimester: Secondary | ICD-10-CM

## 2021-12-07 DIAGNOSIS — Z0289 Encounter for other administrative examinations: Secondary | ICD-10-CM

## 2021-12-07 DIAGNOSIS — O365930 Maternal care for other known or suspected poor fetal growth, third trimester, not applicable or unspecified: Secondary | ICD-10-CM

## 2021-12-07 DIAGNOSIS — Z3A28 28 weeks gestation of pregnancy: Secondary | ICD-10-CM

## 2021-12-07 NOTE — Progress Notes (Signed)
PRENATAL VISIT NOTE  Subjective:  Julie Brooks is a 36 y.o. [phone] at [redacted]w[redacted]d being seen today for ongoing prenatal care. Patient is Spanish-speaking only, interpreter present for this encounter.   She is currently monitored for the following issues for this high-risk pregnancy and has History of preterm delivery; Supervision of high risk pregnancy, antepartum; AMA (advanced maternal age) multigravida 35+; Pre-existing type 2 diabetes mellitus during pregnancy in third trimester; Poorly controlled diabetes mellitus ([place]); Hypertriglyceridemia; Placenta previa antepartum in third trimester; and IUGR (intrauterine growth restriction) affecting care of mother on their problem list.  Patient reports no complaints.  Contractions: Not present. Vag. Bleeding: None.  Movement: Present. Denies leaking of fluid.   The following portions of the patient's history were reviewed and updated as appropriate: allergies, current medications, past family history, past medical history, past social history, past surgical history and problem list.   Objective:   Vitals:   12/07/21 1415  BP: 120/77  Pulse: (!) 104  Weight: 182 lb (82.6 kg)    Fetal Status: Fetal Heart Rate (bpm): 145   Movement: Present     General:  Alert, oriented and cooperative. Patient is in no acute distress.  Skin: Skin is warm and dry. No rash noted.   Cardiovascular: Normal heart rate noted  Respiratory: Normal respiratory effort, no problems with respiration noted  Abdomen: Soft, gravid, appropriate for gestational age.  Pain/Pressure: Present     Pelvic: Cervical exam deferred        Extremities: Normal range of motion.  Edema: None  Mental Status: Normal mood and affect. Normal behavior. Normal judgment and thought content.    [place] MFM OB FOLLOW UP  Result Date: 11/24/2021 ----------------------------------------------------------------------  OBSTETRICS REPORT                       (Signed Final 11/24/2021 02:08 pm)  ---------------------------------------------------------------------- Patient Info  ID #:       [id number]                          D.O.B.:  1985-06-14 (36 yrs)  Name:       Julie Brooks             Visit Date: 11/24/2021 12:55 pm ---------------------------------------------------------------------- Performed By  Attending:        Tama High MD        Ref. Address:     [address]                                                             [place], [place]  Performed By:     Benson Norway          [facility]  Referred By:      Aletha Halim MD ---------------------------------------------------------------------- Orders  #  Description                           Code        Ordered By  1  [place] MFM OB FOLLOW UP                   76816.01    RAVI SHANKAR  2  [place] MFM UA CORD DOPPLER                76820.02    RAVI [facility] ----------------------------------------------------------------------  #  Order #                     Accession #                Episode #  1  [id number]                   [id number]                 [id number]  2  [id number]                   [id number]                 [id number] ---------------------------------------------------------------------- Indications  Pre-existing diabetes, type 2, in pregnancy,   O24.112  second trimester (pump)  Poor obstetric history: Previous preterm       O09.219  delivery, antepartum (35w, 36w)  Advanced maternal age multigravida 12+,        O33.522  second trimester (36)  Obesity complicating pregnancy, second         O99.212  trimester BMI 33)  LR NIPS, Neg AFP  Small for gestational age fetus affecting      O6.5990  management of mother  [redacted]  weeks gestation of pregnancy                Z3A.26 ---------------------------------------------------------------------- Vital Signs  BP:          109/66 ---------------------------------------------------------------------- Fetal Evaluation  Num Of Fetuses:         1  Fetal Heart Rate(bpm):  159  Cardiac Activity:       Observed  Presentation:           Cephalic  Placenta:               Posterior Previa  P. Cord Insertion:      Previously Visualized  Amniotic Fluid  AFI FV:      Within  normal limits                              Largest Pocket(cm)                              4.2 ---------------------------------------------------------------------- Biometry  BPD:      64.8  mm     G. Age:  26w 1d         31  %    CI:        70.52   %    70 - 86                                                          FL/HC:      18.4   %    18.6 - 20.4  HC:       246   mm     G. Age:  26w 5d         33  %    HC/AC:      1.08        1.04 - 1.22  AC:      228.2  mm     G. Age:  27w 1d         66  %    FL/BPD:     69.9   %    71 - 87  FL:       45.3  mm     G. Age:  25w 0d          6  %    FL/AC:      19.9   %    20 - 24  Est. FW:     920  gm           2 lb     34  % ---------------------------------------------------------------------- OB History  Gravidity:    5         Prem:   2         SAB:   2 ---------------------------------------------------------------------- Gestational Age  LMP:           26w Brooks        Date:  05/23/21                   EDD:   02/27/22  U/S Today:     26w 2d                                        EDD:   02/28/22  Best:          26w Brooks     Det. By:  LMP  (05/23/21)          EDD:   02/27/22 ---------------------------------------------------------------------- Anatomy  Cranium:               Appears normal         Aortic Arch:            Appears normal  Cavum:  Appears normal         Ductal Arch:            Previously seen  Ventricles:            Previously seen        Diaphragm:              Appears  normal  Choroid Plexus:        Previously seen        Stomach:                Appears normal, left                                                                        sided  Cerebellum:            Previously seen        Abdomen:                Appears normal  Posterior Fossa:       Previously seen        Abdominal Wall:         Previously seen  Nuchal Fold:           Previously seen        Cord Vessels:           Previously seen  Face:                  Orbits and profile     Kidneys:                Appear normal                         previously seen  Lips:                  Previously seen        Bladder:                Appears normal  Thoracic:              Appears normal         Spine:                  Previously seen  Heart:                 Appears normal         Upper Extremities:      Previously seen                         (4CH, axis, and                         situs)  RVOT:                  Appears normal         Lower Extremities:      Previously seen  LVOT:                  Appears normal  Other:  VC, 3VV and 3VTV visualized.Open hands/5th digits, heels,  nasal          bone, lenses, previously visualized. ---------------------------------------------------------------------- Doppler - Fetal Vessels  Umbilical Artery   S/D     %tile      RI    %tile      PI    %tile     PSV    ADFV    RDFV                                                     (cm/s)    3.4       61     0.7       62     1.1       60     42.7      No      No ---------------------------------------------------------------------- Cervix Uterus Adnexa  Adnexa  No abnormality visualized. ---------------------------------------------------------------------- Impression  Fetal growth restriction.  On ultrasound performed 3 weeks  ago, the estimated fetal weight was at the 7th percentile.  Placenta previa.  On transvaginal ultrasound performed at  her last visit, thin strip of placenta was seen extending and  covering the internal os.  Patient  does not give history of  vaginal bleeding.  On today's ultrasound, fetal growth is appropriate for  gestational age.  Amniotic fluid is normal and good fetal  activity seen.  Umbilical artery Doppler showed normal  forward diastolic flow .Cephalic presentation.  On  transabdominal scan, placental position could not be  assessed clearly.  In the absence of symptoms, we did not  perform vaginal ultrasound.  Patient reports that her diabetes is well controlled.  I reassured the patient with the help of Spanish language  interpreter present in the room. ---------------------------------------------------------------------- Recommendations  -Appointment was made for her to return in 3 weeks for fetal  growth assessment.  -Transvaginal ultrasound to rule out placenta previa and vasa  previa that can be present after resolution of placenta previa. ----------------------------------------------------------------------                 Tama High, MD Electronically Signed Final Report   11/24/2021 02:08 pm ----------------------------------------------------------------------  [place] MFM UA CORD DOPPLER  Result Date: 11/24/2021 ----------------------------------------------------------------------  OBSTETRICS REPORT                       (Signed Final 11/24/2021 02:08 pm) ---------------------------------------------------------------------- Patient Info  ID #:       [id number]                          D.O.B.:  May 24, 1985 (36 yrs)  Name:       Julie Brooks             Visit Date: 11/24/2021 12:55 pm ---------------------------------------------------------------------- Performed By  Attending:        Tama High MD        Ref. Address:     [address]                                                             [place], [place]  [id number]  Performed By:     Benson Norway          [facility]                    RD[facility]                                      [facility] at                                                             [place] for                                                             Women  Referred By:      Aletha Halim MD ---------------------------------------------------------------------- Orders  #  Description                           Code        Ordered By  1  [place] MFM OB FOLLOW UP                   76816.01    RAVI SHANKAR  2  [place] MFM UA CORD DOPPLER                76820.02    RAVI [facility] ----------------------------------------------------------------------  #  Order #                     Accession #                Episode #  1  [id number]                   [id number]                 [id number]  2  [id number]                   [id number]                 [id number] ---------------------------------------------------------------------- Indications  Pre-existing diabetes, type 2, in pregnancy,   O24.112  second trimester (pump)  Poor obstetric history: Previous preterm       O09.219  delivery, antepartum (35w, 36w)  Advanced maternal age multigravida 45+,        O25.522  second trimester (36)  Obesity complicating pregnancy, second         O99.212  trimester BMI 33)  LR NIPS, Neg AFP  Small for gestational age fetus affecting      O56.5990  management of mother  [redacted] weeks gestation of pregnancy                Z3A.26 ---------------------------------------------------------------------- Vital Signs  BP:  109/66 ---------------------------------------------------------------------- Fetal Evaluation  Num Of Fetuses:         1  Fetal Heart Rate(bpm):  159  Cardiac Activity:       Observed  Presentation:           Cephalic  Placenta:               Posterior Previa  P. Cord Insertion:      Previously Visualized  Amniotic Fluid  AFI FV:      Within normal limits                              Largest Pocket(cm)                              4.2 ---------------------------------------------------------------------- Biometry   BPD:      64.8  mm     G. Age:  26w 1d         31  %    CI:        70.52   %    70 - 86                                                          FL/HC:      18.4   %    18.6 - 20.4  HC:       246   mm     G. Age:  26w 5d         33  %    HC/AC:      1.08        1.04 - 1.22  AC:      228.2  mm     G. Age:  27w 1d         66  %    FL/BPD:     69.9   %    71 - 87  FL:       45.3  mm     G. Age:  25w 0d          6  %    FL/AC:      19.9   %    20 - 24  Est. FW:     920  gm           2 lb     34  % ---------------------------------------------------------------------- OB History  Gravidity:    5         Prem:   2         SAB:   2 ---------------------------------------------------------------------- Gestational Age  LMP:           26w Brooks        Date:  05/23/21                   EDD:   02/27/22  U/S Today:     26w 2d                                        EDD:   02/28/22  Best:  26w Brooks     Det. By:  LMP  (05/23/21)          EDD:   02/27/22 ---------------------------------------------------------------------- Anatomy  Cranium:               Appears normal         Aortic Arch:            Appears normal  Cavum:                 Appears normal         Ductal Arch:            Previously seen  Ventricles:            Previously seen        Diaphragm:              Appears normal  Choroid Plexus:        Previously seen        Stomach:                Appears normal, left                                                                        sided  Cerebellum:            Previously seen        Abdomen:                Appears normal  Posterior Fossa:       Previously seen        Abdominal Wall:         Previously seen  Nuchal Fold:           Previously seen        Cord Vessels:           Previously seen  Face:                  Orbits and profile     Kidneys:                Appear normal                         previously seen  Lips:                  Previously seen        Bladder:                Appears normal  Thoracic:               Appears normal         Spine:                  Previously seen  Heart:                 Appears normal         Upper Extremities:      Previously seen                         (4CH, axis, and  situs)  RVOT:                  Appears normal         Lower Extremities:      Previously seen  LVOT:                  Appears normal  Other:  VC, 3VV and 3VTV visualized.Open hands/5th digits, heels, nasal          bone, lenses, previously visualized. ---------------------------------------------------------------------- Doppler - Fetal Vessels  Umbilical Artery   S/D     %tile      RI    %tile      PI    %tile     PSV    ADFV    RDFV                                                     (cm/s)    3.4       61     0.7       62     1.1       60     42.7      No      No ---------------------------------------------------------------------- Cervix Uterus Adnexa  Adnexa  No abnormality visualized. ---------------------------------------------------------------------- Impression  Fetal growth restriction.  On ultrasound performed 3 weeks  ago, the estimated fetal weight was at the 7th percentile.  Placenta previa.  On transvaginal ultrasound performed at  her last visit, thin strip of placenta was seen extending and  covering the internal os.  Patient does not give history of  vaginal bleeding.  On today's ultrasound, fetal growth is appropriate for  gestational age.  Amniotic fluid is normal and good fetal  activity seen.  Umbilical artery Doppler showed normal  forward diastolic flow .Cephalic presentation.  On  transabdominal scan, placental position could not be  assessed clearly.  In the absence of symptoms, we did not  perform vaginal ultrasound.  Patient reports that her diabetes is well controlled.  I reassured the patient with the help of Spanish language  interpreter present in the room. ---------------------------------------------------------------------- Recommendations  -Appointment was made for  her to return in 3 weeks for fetal  growth assessment.  -Transvaginal ultrasound to rule out placenta previa and vasa  previa that can be present after resolution of placenta previa. ----------------------------------------------------------------------                 Tama High, MD Electronically Signed Final Report   11/24/2021 02:08 pm ----------------------------------------------------------------------  [place] MFM UA CORD DOPPLER  Result Date: 11/17/2021 ----------------------------------------------------------------------  OBSTETRICS REPORT                       (Signed Final 11/17/2021 01:38 pm) ---------------------------------------------------------------------- Patient Info  ID #:       [id number]                          D.O.B.:  01-05-1986 (36 yrs)  Name:       Julie Brooks             Visit Date: 11/17/2021 01:22 pm ---------------------------------------------------------------------- Performed By  Attending:        Johnell Comings MD  Ref. Address:     [address]                                                             [place], [place]  Performed By:     Benson Norway          [facility]                    RD[facility]                                     [facility] at                                                             [place] [facility]                                                             Women  Referred By:      Aletha Halim MD ---------------------------------------------------------------------- Orders  #  Description                           Code        Ordered By  1  [place] MFM UA CORD DOPPLER                76820.02    Vincent  2  [place] MFM OB TRANSVAGINAL                76817.2     Velarde  3  [place] MFM OB LIMITED                     76815.01    RAVI [facility] ----------------------------------------------------------------------  #  Order #                      Accession #                Episode #  1  [id number]                   [id number]                 [id number]  2  [id number]  [id number]                 [id number]  3  [id number]                   [id number]                 [id number] ---------------------------------------------------------------------- Indications  Pre-existing diabetes, type 2, in pregnancy,   O24.112  second trimester (pump)  Poor obstetric history: Previous preterm       O09.219  delivery, antepartum (35w, 36w)  Advanced maternal age multigravida 91+,        O79.522  second trimester (36)  Obesity complicating pregnancy, second         O99.212  trimester BMI 33)  LR NIPS, Neg AFP  [redacted] weeks gestation of pregnancy                Z3A.25  Small for gestational age fetus affecting      O37.5990  management of mother ---------------------------------------------------------------------- Vital Signs  BP:          104/56 ---------------------------------------------------------------------- Fetal Evaluation  Num Of Fetuses:         1  Fetal Heart Rate(bpm):  158  Cardiac Activity:       Observed  Presentation:           Breech  Placenta:               Posterior Previa  P. Cord Insertion:      Previously Visualized  Amniotic Fluid  AFI FV:      Within normal limits                              Largest Pocket(cm)                              5.7 ---------------------------------------------------------------------- OB History  Gravidity:    5         Prem:   2         SAB:   2 ---------------------------------------------------------------------- Gestational Age  LMP:           25w Brooks        Date:  05/23/21                   EDD:   02/27/22  Best:          Julie Brooks     Det. By:  LMP  (05/23/21)          EDD:   02/27/22 ---------------------------------------------------------------------- Doppler - Fetal Vessels  Umbilical Artery   S/D     %tile      RI    %tile      PI    %tile     PSV    ADFV    RDFV                                                      (cm/s)      3       34     0.7       57       1       34  41.6      No      No ---------------------------------------------------------------------- Comments  This patient was seen due to an IUGR fetus and placenta  previa.  Her pregnancy has also been complicated by  maternal obesity, pregestational diabetes treated with insulin  and metformin, and advanced maternal age.  She had a normal fetal echocardiogram performed with [facility]  pediatric cardiology.  She denies any problems since her last exam.  She reports  feeling vigorous fetal movements throughout the day.  There was normal amniotic fluid noted on today's exam.  Doppler studies of the umbilical arteries performed due to  fetal growth restriction showed a normal S/D ratio of 3.0.  There were no signs of absent or reversed end-diastolic flow  noted today.  A placenta previa continues to be noted on today's exam.  This was confirmed via transvaginal ultrasound.  She will return in 1 week for another growth scan and  umbilical artery Doppler study.  All conversations were held with the patient today with the  help of a Spanish interpreter. ----------------------------------------------------------------------                   Johnell Comings, MD Electronically Signed Final Report   11/17/2021 01:38 pm ----------------------------------------------------------------------  [place] MFM OB Transvaginal  Result Date: 11/17/2021 ----------------------------------------------------------------------  OBSTETRICS REPORT                       (Signed Final 11/17/2021 01:38 pm) ---------------------------------------------------------------------- Patient Info  ID #:       [id number]                          D.O.B.:  03-26-1985 (36 yrs)  Name:       Julie Brooks             Visit Date: 11/17/2021 01:22 pm ---------------------------------------------------------------------- Performed By  Attending:        Johnell Comings MD         Ref. Address:     [address]                                                              [place], [place]  Performed By:     Benson Norway          [facility]                    RD[facility]                                     [facility] at  [facility]                                                             Women  Referred By:      Aletha Halim MD ---------------------------------------------------------------------- Orders  #  Description                           Code        Ordered By  1  [place] MFM UA CORD DOPPLER                76820.02    RAVI SHANKAR  2  [place] MFM OB TRANSVAGINAL                76817.2     RAVI SHANKAR  3  [place] MFM OB LIMITED                     76815.01    RAVI SHANKAR ----------------------------------------------------------------------  #  Order #                     Accession #                Episode #  1  [id number]                   [id number]  2  [id number]                   [id number]                 [id number]  3  [id number]                   [id number]                 [id number] ---------------------------------------------------------------------- Indications  Pre-existing diabetes, type 2, in pregnancy,   O24.112  second trimester (pump)  Poor obstetric history: Previous preterm       O09.219  delivery, antepartum (35w, 36w)  Advanced maternal age multigravida 47+,        O37.522  second trimester (36)  Obesity complicating pregnancy, second         O99.212  trimester BMI 33)  LR NIPS, Neg AFP  [redacted] weeks gestation of pregnancy                Z3A.25  Small for gestational age fetus affecting      O69.5990  management of mother ---------------------------------------------------------------------- Vital Signs  BP:          104/56 ---------------------------------------------------------------------- Fetal Evaluation  Num Of Fetuses:         1   Fetal Heart Rate(bpm):  158  Cardiac Activity:       Observed  Presentation:           Breech  Placenta:               Posterior Previa  P. Cord Insertion:      Previously Visualized  Amniotic Fluid  AFI  FV:      Within normal limits                              Largest Pocket(cm)                              5.7 ---------------------------------------------------------------------- OB History  Gravidity:    5         Prem:   2         SAB:   2 ---------------------------------------------------------------------- Gestational Age  LMP:           25w Brooks        Date:  05/23/21                   EDD:   02/27/22  Best:          Julie Brooks     Det. By:  LMP  (05/23/21)          EDD:   02/27/22 ---------------------------------------------------------------------- Doppler - Fetal Vessels  Umbilical Artery   S/D     %tile      RI    %tile      PI    %tile     PSV    ADFV    RDFV                                                     (cm/s)      3       34     0.7       57       1       34     41.6      No      No ---------------------------------------------------------------------- Comments  This patient was seen due to an IUGR fetus and placenta  previa.  Her pregnancy has also been complicated by  maternal obesity, pregestational diabetes treated with insulin  and metformin, and advanced maternal age.  She had a normal fetal echocardiogram performed with [facility]  pediatric cardiology.  She denies any problems since her last exam.  She reports  feeling vigorous fetal movements throughout the day.  There was normal amniotic fluid noted on today's exam.  Doppler studies of the umbilical arteries performed due to  fetal growth restriction showed a normal S/D ratio of 3.0.  There were no signs of absent or reversed end-diastolic flow  noted today.  A placenta previa continues to be noted on today's exam.  This was confirmed via transvaginal ultrasound.  She will return in 1 week for another growth scan and  umbilical artery Doppler study.   All conversations were held with the patient today with the  help of a Spanish interpreter. ----------------------------------------------------------------------                   Johnell Comings, MD Electronically Signed Final Report   11/17/2021 01:38 pm ----------------------------------------------------------------------  [place] MFM OB LIMITED  Result Date: 11/17/2021 ----------------------------------------------------------------------  OBSTETRICS REPORT                       (Signed Final 11/17/2021 01:38 pm) ---------------------------------------------------------------------- Patient Info  ID #:  [id number]                          D.O.B.:  11/01/1985 (36 yrs)  Name:       Julie Brooks             Visit Date: 11/17/2021 01:22 pm ---------------------------------------------------------------------- Performed By  Attending:        Johnell Comings MD         Ref. Address:     [address]                                                             [place], [place]  Performed By:     Benson [place]          [facility]                                                             [place] [facility]                                                             [facility]  Referred By:      Aletha Halim MD ---------------------------------------------------------------------- Orders  #  Description                           Code        Ordered By  1  [place] MFM UA CORD DOPPLER                76820.02    RAVI [facility]  2  [place] MFM OB TRANSVAGINAL                76817.2     RAVI SHANKAR  3  [place] MFM OB LIMITED                     [id number]    RAVI SHANKAR ----------------------------------------------------------------------  #  Order #  Accession #                Episode #  1  [id number]                   [id number]                  [id number]  2  [id number]                   [id number]                 [id number]  3  [id number]                   [id number]                 [id number] ---------------------------------------------------------------------- Indications  Pre-existing diabetes, type 2, in pregnancy,   O24.112  second trimester (pump)  Poor obstetric history: Previous preterm       O09.219  delivery, antepartum (35w, 36w)  Advanced maternal age multigravida 73+,        O66.522  second trimester (36)  Obesity complicating pregnancy, second         O99.212  trimester BMI 33)  LR NIPS, Neg AFP  [redacted] weeks gestation of pregnancy                Z3A.25  Small for gestational age fetus affecting      O58.5990  management of mother ---------------------------------------------------------------------- Vital Signs  BP:          104/56 ---------------------------------------------------------------------- Fetal Evaluation  Num Of Fetuses:         1  Fetal Heart Rate(bpm):  158  Cardiac Activity:       Observed  Presentation:           Breech  Placenta:               Posterior Previa  P. Cord Insertion:      Previously Visualized  Amniotic Fluid  AFI FV:      Within normal limits                              Largest Pocket(cm)                              5.7 ---------------------------------------------------------------------- OB History  Gravidity:    5         Prem:   2         SAB:   2 ---------------------------------------------------------------------- Gestational Age  LMP:           25w Brooks        Date:  05/23/21                   EDD:   02/27/22  Best:          Julie Brooks     Det. By:  LMP  (05/23/21)          EDD:   02/27/22 ---------------------------------------------------------------------- Doppler - Fetal Vessels  Umbilical Artery   S/D     %tile      RI    %tile      PI    %tile     PSV    ADFV    RDFV                                                     (  cm/s)      3       34     0.7       57       1       34     41.6      No      No  ---------------------------------------------------------------------- Comments  This patient was seen due to an IUGR fetus and placenta  previa.  Her pregnancy has also been complicated by  maternal obesity, pregestational diabetes treated with insulin  and metformin, and advanced maternal age.  She had a normal fetal echocardiogram performed with [facility]  pediatric cardiology.  She denies any problems since her last exam.  She reports  feeling vigorous fetal movements throughout the day.  There was normal amniotic fluid noted on today's exam.  Doppler studies of the umbilical arteries performed due to  fetal growth restriction showed a normal S/D ratio of 3.0.  There were no signs of absent or reversed end-diastolic flow  noted today.  A placenta previa continues to be noted on today's exam.  This was confirmed via transvaginal ultrasound.  She will return in 1 week for another growth scan and  umbilical artery Doppler study.  All conversations were held with the patient today with the  help of a Spanish interpreter. ----------------------------------------------------------------------                   Johnell Comings, MD Electronically Signed Final Report   11/17/2021 01:38 pm ----------------------------------------------------------------------   Assessment and Plan:  Pregnancy: [id number] at [redacted]w[redacted]d 1. Pre-existing type 2 diabetes mellitus during pregnancy in third trimester Current regimen: Insulin via omniopod and MTF CBGs: 90-100s fastings. Her meals are almost all normal.  Will meet with Levie Heritage soon to discuss small changes at bedtime to improve fasting profile.   Continue scheduled scans as per MFM.  2. Poor fetal growth affecting management of mother in third trimester, single or unspecified fetus Last scan showed EFW 34%, much improved from 3%.  This is now resolved  3. Placenta previa antepartum in third trimester Follow up on next scan. Bleeding precautions reviewed.  103. Multigravida of  advanced maternal age in third trimester LR NIPS.  5. Need for Tdap vaccination - Tdap vaccine greater than or equal to 7yo IM given today.  6. [redacted] weeks gestation of pregnancy 7. Supervision of high risk pregnancy, antepartum Work restrictions letter given again, she is having some issues at work. Preterm labor symptoms and general obstetric precautions including but not limited to vaginal bleeding, contractions, leaking of fluid and fetal movement were reviewed in detail with the patient. Please refer to After Visit Summary for other counseling recommendations.   Return in about 2 weeks (around 12/21/2021) for OFFICE OB VISIT (MD only).  Future Appointments  Date Time Provider [place]  12/15/2021  1:15 PM [facility] NURSE [facility]  12/15/2021  1:30 PM [facility] US2 [facility]  12/30/2021 10:15 AM Chancy Milroy, MD [facility]    Verita Schneiders, MD

## 2021-12-07 NOTE — Progress Notes (Signed)
dap

## 2021-12-07 NOTE — Patient Instructions (Signed)
Return to office for any scheduled appointments. Call the office or go to the MAU at [facility] if: You begin to have strong, frequent contractions Your water breaks.  Sometimes it is a big gush of fluid, sometimes it is just a trickle that keeps getting your underwear wet or running down your legs You have vaginal bleeding.  It is normal to have a small amount of spotting if your cervix was checked.  You do not feel your baby moving like normal.  If you do not, get something to eat and drink and lay down and focus on feeling your baby move.   If your baby is still not moving like normal, you should call the office or go to MAU. Any other obstetric concerns.  

## 2021-12-15 ENCOUNTER — Ambulatory Visit: Payer: Managed Care, Other (non HMO) | Attending: Obstetrics and Gynecology

## 2021-12-15 ENCOUNTER — Ambulatory Visit: Payer: Managed Care, Other (non HMO) | Admitting: *Deleted

## 2021-12-15 ENCOUNTER — Other Ambulatory Visit: Payer: Self-pay | Admitting: Obstetrics and Gynecology

## 2021-12-15 ENCOUNTER — Other Ambulatory Visit: Payer: Self-pay | Admitting: *Deleted

## 2021-12-15 VITALS — BP 121/72 | HR 98

## 2021-12-15 DIAGNOSIS — O24912 Unspecified diabetes mellitus in pregnancy, second trimester: Secondary | ICD-10-CM

## 2021-12-15 DIAGNOSIS — O365930 Maternal care for other known or suspected poor fetal growth, third trimester, not applicable or unspecified: Secondary | ICD-10-CM

## 2021-12-15 DIAGNOSIS — O09523 Supervision of elderly multigravida, third trimester: Secondary | ICD-10-CM

## 2021-12-15 DIAGNOSIS — O4402 Placenta previa specified as without hemorrhage, second trimester: Secondary | ICD-10-CM

## 2021-12-15 DIAGNOSIS — Z3A29 29 weeks gestation of pregnancy: Secondary | ICD-10-CM

## 2021-12-15 DIAGNOSIS — O09213 Supervision of pregnancy with history of pre-term labor, third trimester: Secondary | ICD-10-CM

## 2021-12-15 DIAGNOSIS — O0990 Supervision of high risk pregnancy, unspecified, unspecified trimester: Secondary | ICD-10-CM

## 2021-12-15 DIAGNOSIS — O24113 Pre-existing diabetes mellitus, type 2, in pregnancy, third trimester: Secondary | ICD-10-CM | POA: Diagnosis not present

## 2021-12-15 DIAGNOSIS — O99213 Obesity complicating pregnancy, third trimester: Secondary | ICD-10-CM

## 2021-12-15 DIAGNOSIS — E669 Obesity, unspecified: Secondary | ICD-10-CM

## 2021-12-15 DIAGNOSIS — O24913 Unspecified diabetes mellitus in pregnancy, third trimester: Secondary | ICD-10-CM

## 2021-12-16 ENCOUNTER — Encounter: Payer: Self-pay | Admitting: Family Medicine

## 2021-12-22 ENCOUNTER — Other Ambulatory Visit: Payer: Self-pay

## 2021-12-22 ENCOUNTER — Encounter: Payer: Managed Care, Other (non HMO) | Attending: Obstetrics and Gynecology | Admitting: Registered"

## 2021-12-22 ENCOUNTER — Ambulatory Visit (INDEPENDENT_AMBULATORY_CARE_PROVIDER_SITE_OTHER): Payer: Commercial Managed Care - HMO | Admitting: Registered"

## 2021-12-22 ENCOUNTER — Telehealth: Payer: Self-pay | Admitting: Pharmacist

## 2021-12-22 DIAGNOSIS — Z7984 Long term (current) use of oral hypoglycemic drugs: Secondary | ICD-10-CM | POA: Insufficient documentation

## 2021-12-22 DIAGNOSIS — Z794 Long term (current) use of insulin: Secondary | ICD-10-CM | POA: Diagnosis not present

## 2021-12-22 DIAGNOSIS — E119 Type 2 diabetes mellitus without complications: Secondary | ICD-10-CM | POA: Insufficient documentation

## 2021-12-22 DIAGNOSIS — O24113 Pre-existing diabetes mellitus, type 2, in pregnancy, third trimester: Secondary | ICD-10-CM | POA: Insufficient documentation

## 2021-12-22 DIAGNOSIS — Z3A30 30 weeks gestation of pregnancy: Secondary | ICD-10-CM | POA: Diagnosis not present

## 2021-12-22 NOTE — Telephone Encounter (Signed)
Patient having trouble filling Dexcom sensors. Reached out to [facility] who says the original PA for the Dexcom sensors were canceled. They were unable to verify why this occurred. A new PA had been submitted (#[id number]) for Dexcom G6 sensors and should be approved within the next 24 hours.   Thank you for allowing pharmacy to be a part of this patient's care.  Lolita Rieger, PharmD, BCPPS

## 2021-12-22 NOTE — Progress Notes (Signed)
Interpreter services provided by [employer] from [place]  Patient T2D in pregnancy was seen on 12/22/21 for follow-up  Start time: 1315     End time 1400  Patient was seen for Omnipod and diet review per MD request due to elevated FBS.  EDD 02/27/2022; [redacted]w[redacted]d  SMBG: all fasting ~100 mg/dL, PPBG 114-140s, mostly close to controlled, pt denies hypoglycemia  Pt has been doing finger sticks because she said was unable to get refills for Dexcom G6. Pharmacy said waiting on prior-auth. Lolita Rieger sent PA stat and believes it will go through within 24 hrs.  Pt is using Omnipod in manual mode with the following settings: Basal rate 0.55 u/hr Bolus 2 u with meals / 1 u with snacks  Pt recently reduced her hours from 12/day to 8/day. 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 It appears pt is doing much to control blood sugar and just a small adjustment in her basal rate would bring her readings into normal range.  To address fasting blood sugar, Basal rate was increased by 10%.   The following learning objectives reviewed during follow-up visit:  Use of [employer] account for healthcare providers to have access to real-time data of insulin and blood sugar control. Purpose of basal setting change  Plan:  Pump basal setting was changed to 0.60 u/hr and patient instructed to continue using 2 u bolus with meals and 1 u bolus with snacks.  Patient to pick up Dexcom G6 and start using CGM again before follow-up visit next week. Pt is to return for follow up in 1 week to review blood sugar data and if possible start using pump in automated mode with readings for Dexcom   Patient instructed to monitor glucose levels: FBS: 60 - 95 mg/dl 2 hour: <120 mg/dl  Patient received the following handouts: none  Patient will be seen for follow-up in 1 weeks or as needed.

## 2021-12-26 ENCOUNTER — Encounter: Payer: Self-pay | Admitting: General Practice

## 2021-12-28 ENCOUNTER — Telehealth: Payer: Self-pay | Admitting: *Deleted

## 2021-12-28 NOTE — Telephone Encounter (Addendum)
Pt left VM message stating she needed help with somthing but unable to understand her message

## 2021-12-29 ENCOUNTER — Encounter: Payer: Managed Care, Other (non HMO) | Attending: Obstetrics & Gynecology | Admitting: Registered"

## 2021-12-29 ENCOUNTER — Other Ambulatory Visit: Payer: Self-pay

## 2021-12-29 ENCOUNTER — Ambulatory Visit (INDEPENDENT_AMBULATORY_CARE_PROVIDER_SITE_OTHER): Payer: Commercial Managed Care - HMO | Admitting: Registered"

## 2021-12-29 DIAGNOSIS — O24113 Pre-existing diabetes mellitus, type 2, in pregnancy, third trimester: Secondary | ICD-10-CM

## 2021-12-29 DIAGNOSIS — E119 Type 2 diabetes mellitus without complications: Secondary | ICD-10-CM | POA: Diagnosis not present

## 2021-12-29 DIAGNOSIS — Z3A00 Weeks of gestation of pregnancy not specified: Secondary | ICD-10-CM | POA: Insufficient documentation

## 2021-12-29 NOTE — Progress Notes (Signed)
[employer] Interpreter services [place] [phone]  Patient was seen on 12/29/21 for follow-up assessment after making changes to Omnipod basal rate  Shorty after last visit patient has issues with her pump controller and was not  able to use the bolus function.  Patient's blood sugar has increased due to problems with being able to use her insulin pump and not being able to afford buying long and short-acting injectable insulin. Today patient's controller started working and is able to get her basal and bolus insulin. Patient was not eating well for a few days because she was afraid of blood sugar going too high.  Pt also states was not able to continue using the Dexcom because her transmitter expired and the pharmacy told her that her insurance wouldn't cover it. RD called pharmacy today and the issue with the prescription was fixed and patient will be able to pick up transmitter tomorrow.  Patient was taught how to use the automated function. Which can help to get tighter control of blood sugar but the safety feature of the target glucose is hard to get the fasting blood sugar to go below 110 mg/dL.   Health care team will be updated and let patient know if she should keep using manual mode or switch to automated mode.  Patient will be seen for follow-up in 2 weeks or as needed.

## 2021-12-29 NOTE — Telephone Encounter (Signed)
Called patient with Eda assisting with spanish interpretation stating we are returning her phone call. Patient states she was here earlier and saw Marylene Land so she got her questions answered. Patient didn't need assistance today.

## 2021-12-30 ENCOUNTER — Encounter: Payer: Self-pay | Admitting: Family Medicine

## 2021-12-30 ENCOUNTER — Ambulatory Visit (INDEPENDENT_AMBULATORY_CARE_PROVIDER_SITE_OTHER): Payer: Commercial Managed Care - HMO | Admitting: Obstetrics and Gynecology

## 2021-12-30 ENCOUNTER — Encounter: Payer: Self-pay | Admitting: Obstetrics and Gynecology

## 2021-12-30 VITALS — BP 108/75 | HR 98 | Wt 182.4 lb

## 2021-12-30 DIAGNOSIS — O0993 Supervision of high risk pregnancy, unspecified, third trimester: Secondary | ICD-10-CM

## 2021-12-30 DIAGNOSIS — O0990 Supervision of high risk pregnancy, unspecified, unspecified trimester: Secondary | ICD-10-CM

## 2021-12-30 DIAGNOSIS — O4403 Placenta previa specified as without hemorrhage, third trimester: Secondary | ICD-10-CM

## 2021-12-30 DIAGNOSIS — O24113 Pre-existing diabetes mellitus, type 2, in pregnancy, third trimester: Secondary | ICD-10-CM

## 2021-12-30 DIAGNOSIS — O09523 Supervision of elderly multigravida, third trimester: Secondary | ICD-10-CM

## 2021-12-30 NOTE — Patient Instructions (Signed)
Tercer trimestre de embarazo Third Trimester of Pregnancy  El tercer trimestre de embarazo va desde la semana 28 hasta la semana 40. Tambin se dice que va desde el mes 7 hasta el mes 9. En este trimestre, el beb en gestacin (feto) crece muy rpidamente. Hacia el final del noveno mes, el beb en gestacin mide alrededor de 20 pulgadas (45 cm) de largo. Pesa entre 6 y 10 libras (2,70 y 4,50 kg). Cambios en el cuerpo durante el tercer trimestre Su organismo contina atravesando por muchos cambios durante este perodo. Los cambios varan y generalmente vuelven a la normalidad despus del nacimiento del beb. Cambios fsicos Seguir aumentando de peso. Puede ser que aumente entre 25 y 35 libras (11 y 16 kg) hacia el final del embarazo. Si tiene bajo peso, puede aumentar entre 28 y 40 lb (unos 13 a 18 kg). Si tiene sobrepeso, puede aumentar entre 15 y 25 libras (unos 7 a 11 kg). Podrn aparecer las primeras estras en las caderas, el vientre (abdomen) y las mamas. Las mamas seguirn creciendo y pueden doler. Un lquido amarillo (calostro) puede salir de sus pechos. Esta es la primera leche que usted produce para el beb. Tal vez haya cambios en el cabello. El ombligo puede salir hacia afuera. Puede observar que se le hinchan ms las manos, la cara o los tobillos. Cambios en la salud Es posible que tenga acidez estomacal. Es posible que tenga dificultades para defecar (estreimiento). Pueden aparecerle hemorroides. Estas son venas hinchadas en el ano que pueden picar o doler. Puede comenzar a tener venas hinchadas (vrices) en las piernas. Puede presentar ms dolor en la pelvis, la espalda o los muslos. Puede presentar ms hormigueo o entumecimiento en las manos, los brazos y las piernas. La piel de su vientre tambin puede sentirse entumecida. Es posible que sienta falta de aire a medida que el tero se agranda. Otros cambios Es posible que haga pis (orine) con mayor frecuencia. Puede tener ms  problemas para dormir. Puede notar que el beb en gestacin "baja" o se mueve ms hacia bajo, en el vientre. Puede notar ms secrecin proveniente de la vagina. Puede sentir las articulaciones flojas y puede sentir dolor alrededor del hueso plvico. Siga estas instrucciones en su casa: Medicamentos Use los medicamentos de venta libre y los recetados solamente como se lo haya indicado el mdico. Algunos medicamentos no son seguros durante el embarazo. Tome vitaminas prenatales que contengan por lo menos 600 microgramos (mcg) de cido flico. Comida y bebida Consuma comidas saludables que incluyan lo siguiente: Frutas y verduras frescas. Cereales integrales. Buenas fuentes de protenas, como carne, huevos y tofu. Productos lcteos con bajo contenido de grasa. Evite la carne cruda y el jugo, la leche y el queso sin pasteurizar. Estos portan grmenes que pueden provocar dao tanto a usted como al beb. Tome 4 o 5 comidas pequeas en lugar de 3 comidas abundantes al da. Es posible que deba tomar medidas para prevenir o tratar los problemas para defecar: Beber suficiente lquido para mantener el pis (orina) de color amarillo plido. Come alimentos ricos en fibra. Entre ellos, frijoles, cereales integrales y frutas y verduras frescas. Limitar los alimentos con alto contenido de grasa y azcar. Estos incluyen alimentos fritos o dulces. Actividad Haga ejercicios solamente como se lo haya indicado el mdico. Interrumpa la actividad fsica si comienza a tener clicos en el tero. Evite levantar pesos excesivos. No haga ejercicio si hace demasiado calor, hay demasiada humedad o se encuentra en un lugar de mucha   altura (altitud elevada). Si lo desea, puede continuar teniendo relaciones sexuales, a menos que el mdico le indique lo contrario. Alivio del dolor y del malestar Haga pausas con frecuencia y descanse con las piernas levantadas (elevadas) si tiene calambres en las piernas o dolor en la parte  baja de la espalda. Dese baos de asiento con agua tibia para aliviar el dolor o las molestias causadas por las hemorroides. Use una crema para las hemorroides si el mdico la autoriza. Use un sostn que le brinde buen soporte si sus mamas estn sensibles. Si desarrolla venas hinchadas y abultadas en las piernas: Use medias de compresin segn las indicaciones de su mdico. Levante los pies durante 15 minutos, 3 o 4 veces por da. Limite la sal en sus alimentos. Seguridad Hable con el mdico antes de recorrer largas distancias. No se d baos de inmersin en agua caliente, baos turcos ni saunas. Use el cinturn de seguridad en todo momento mientras vaya en auto. Hable con el mdico si alguien le est haciendo dao o gritando mucho. Preparacin para la llegada del beb Para prepararse para la llegada de su beb: Tome clases prenatales. Visite el hospital y recorra el rea de maternidad. Compre un asiento de seguridad orientado hacia atrs para llevar al beb en el automvil. Aprenda cmo instalarlo en el auto. Prepare la habitacin del beb. Saque todas las almohadas y los animales de peluche de la cuna del beb. Instrucciones generales Evite el contacto con las bandejas sanitarias de los gatos y la tierra que estos animales usan. Estos contienen grmenes que pueden daar al beb y causar la prdida del beb ya sea aborto espontneo o muerte fetal. No se haga duchas vaginales ni use tampones. No use tampones ni toallas higinicas perfumadas. No fume ni consuma ningn producto que contenga nicotina o tabaco. Si necesita ayuda para dejar de fumar, consulte al mdico. No beba alcohol. No use medicamentos a base de hierbas, drogas ilegales, ni medicamentos que el mdico no haya autorizado. Las sustancias qumicas de estos productos pueden afectar al beb. Cumpla con todas las visitas de seguimiento. Esto es importante. Dnde buscar ms informacin American Pregnancy Association (Asociacin  Americana del Embarazo): americanpregnancy.org American College of Obstetricians and Gynecologists (Colegio Estadounidense de Obstetras y Gineclogos): www.acog.org Office on Women's Health (Oficina para la Salud de la Mujer): womenshealth.gov/pregnancy Comunquese con un mdico si: Tiene fiebre. Tiene clicos leves o siente presin en la parte baja del vientre. Sufre un dolor persistente en el abdomen. Vomita o hace deposiciones acuosas (diarrea). Advierte lquido con mal olor que proviene de la vagina. Siente dolor al orinar o hace orina con mal olor. Tiene un dolor de cabeza que no desaparece despus de tomar analgsicos. Nota cambios en la visin o ve manchas delante de los ojos. Solicite ayuda de inmediato si: Rompe la bolsa. Tiene contracciones regulares separadas por menos de 5 minutos. Tiene sangrado o pequeas prdidas vaginales. Tiene clicos o dolor muy intensos en el vientre. Tiene dificultad para respirar. Sientes dolor en el pecho. Se desmaya. No ha sentido al beb moverse durante el tiempo que le indic el mdico. Tiene dolor, hinchazn o enrojecimiento nuevos en un brazo o una pierna o se produce un aumento de alguno de estos sntomas. Resumen El tercer trimestre comprende desde la semana 28 hasta la semana 40 (desde el mes 7 hasta el mes 9). Esta es la poca en que el beb en gestacin crece muy rpidamente. Durante este perodo, las molestias pueden aumentar a medida que usted   sube de peso y el beb crece. Preprese para la llegada del beb: asista a las clases prenatales, compre un asiento de seguridad orientado hacia atrs para llevar al beb en auto y prepare la habitacin del beb. Solicite ayuda de inmediato si tiene sangrado por la vagina, siente dolor en el pecho y tiene dificultad para respirar, o si no ha sentido al beb moverse durante el tiempo que le indic el mdico. Esta informacin no tiene como fin reemplazar el consejo del mdico. Asegrese de hacerle al  mdico cualquier pregunta que tenga. Document Revised: 08/20/2019 Document Reviewed: 08/20/2019 Elsevier Patient Education  2023 Elsevier Inc.  

## 2021-12-30 NOTE — Progress Notes (Signed)
Subjective:  Julie Brooks is a 36 y.o. [id number] at [redacted]w[redacted]d being seen today for ongoing prenatal care.  She is currently monitored for the following issues for this high-risk pregnancy and has History of preterm delivery; Supervision of high risk pregnancy, antepartum; AMA (advanced maternal age) multigravida 35+; Pre-existing type 2 diabetes mellitus during pregnancy in third trimester; Poorly controlled diabetes mellitus (HCC); Hypertriglyceridemia; and Placenta previa antepartum in third trimester on their problem list.  Patient reports  general discomforts of pregnancy .  Contractions: Irritability. Vag. Bleeding: None.  Movement: Present. Denies leaking of fluid.   The following portions of the patient's history were reviewed and updated as appropriate: allergies, current medications, past family history, past medical history, past social history, past surgical history and problem list. Problem list updated.  Objective:   Vitals:   12/30/21 1018  BP: 108/75  Pulse: 98  Weight: 182 lb 6.4 oz (82.7 kg)    Fetal Status: Fetal Heart Rate (bpm): 146   Movement: Present     General:  Alert, oriented and cooperative. Patient is in no acute distress.  Skin: Skin is warm and dry. No rash noted.   Cardiovascular: Normal heart rate noted  Respiratory: Normal respiratory effort, no problems with respiration noted  Abdomen: Soft, gravid, appropriate for gestational age. Pain/Pressure: Present     Pelvic:  Cervical exam deferred        Extremities: Normal range of motion.  Edema: Trace  Mental Status: Normal mood and affect. Normal behavior. Normal judgment and thought content.   Urinalysis:      Assessment and Plan:  Pregnancy: [id number] at [redacted]w[redacted]d  1. Supervision of high risk pregnancy, antepartum Stable  2. Multigravida of advanced maternal age in third trimester Normal genetics  3. Pre-existing type 2 diabetes mellitus during pregnancy in third trimester Insulin pump was not working  for a few weeks. Saw Angela yesterday and it is working now Serial growth scans and antenatal testing as per MFM  4. Placenta previa antepartum in third trimester Resolved on last U/S  Preterm labor symptoms and general obstetric precautions including but not limited to vaginal bleeding, contractions, leaking of fluid and fetal movement were reviewed in detail with the patient. Please refer to After Visit Summary for other counseling recommendations.  Return in about 2 weeks (around 01/13/2022) for OB visit, face to face, MD only.   Hermina Staggers, MD

## 2022-01-06 ENCOUNTER — Other Ambulatory Visit: Payer: Self-pay | Admitting: *Deleted

## 2022-01-06 ENCOUNTER — Ambulatory Visit: Payer: Managed Care, Other (non HMO) | Admitting: *Deleted

## 2022-01-06 ENCOUNTER — Ambulatory Visit: Payer: Managed Care, Other (non HMO) | Attending: Obstetrics

## 2022-01-06 VITALS — BP 120/73 | HR 109

## 2022-01-06 DIAGNOSIS — O24913 Unspecified diabetes mellitus in pregnancy, third trimester: Secondary | ICD-10-CM | POA: Diagnosis present

## 2022-01-06 DIAGNOSIS — O09523 Supervision of elderly multigravida, third trimester: Secondary | ICD-10-CM | POA: Diagnosis present

## 2022-01-06 DIAGNOSIS — E669 Obesity, unspecified: Secondary | ICD-10-CM

## 2022-01-06 DIAGNOSIS — E119 Type 2 diabetes mellitus without complications: Secondary | ICD-10-CM

## 2022-01-06 DIAGNOSIS — Z3A32 32 weeks gestation of pregnancy: Secondary | ICD-10-CM

## 2022-01-06 DIAGNOSIS — O24113 Pre-existing diabetes mellitus, type 2, in pregnancy, third trimester: Secondary | ICD-10-CM

## 2022-01-06 DIAGNOSIS — O365930 Maternal care for other known or suspected poor fetal growth, third trimester, not applicable or unspecified: Secondary | ICD-10-CM

## 2022-01-06 DIAGNOSIS — O0990 Supervision of high risk pregnancy, unspecified, unspecified trimester: Secondary | ICD-10-CM

## 2022-01-06 DIAGNOSIS — O99213 Obesity complicating pregnancy, third trimester: Secondary | ICD-10-CM | POA: Diagnosis present

## 2022-01-06 DIAGNOSIS — Z362 Encounter for other antenatal screening follow-up: Secondary | ICD-10-CM

## 2022-01-06 DIAGNOSIS — O4440 Low lying placenta NOS or without hemorrhage, unspecified trimester: Secondary | ICD-10-CM

## 2022-01-11 ENCOUNTER — Ambulatory Visit: Payer: Managed Care, Other (non HMO) | Admitting: *Deleted

## 2022-01-11 ENCOUNTER — Ambulatory Visit: Payer: Managed Care, Other (non HMO) | Attending: Obstetrics

## 2022-01-11 VITALS — BP 112/64 | HR 91

## 2022-01-11 DIAGNOSIS — Z362 Encounter for other antenatal screening follow-up: Secondary | ICD-10-CM

## 2022-01-11 DIAGNOSIS — O09213 Supervision of pregnancy with history of pre-term labor, third trimester: Secondary | ICD-10-CM | POA: Diagnosis not present

## 2022-01-11 DIAGNOSIS — O24913 Unspecified diabetes mellitus in pregnancy, third trimester: Secondary | ICD-10-CM | POA: Diagnosis not present

## 2022-01-11 DIAGNOSIS — O0990 Supervision of high risk pregnancy, unspecified, unspecified trimester: Secondary | ICD-10-CM | POA: Diagnosis present

## 2022-01-11 DIAGNOSIS — E669 Obesity, unspecified: Secondary | ICD-10-CM

## 2022-01-11 DIAGNOSIS — O99213 Obesity complicating pregnancy, third trimester: Secondary | ICD-10-CM

## 2022-01-11 DIAGNOSIS — O365930 Maternal care for other known or suspected poor fetal growth, third trimester, not applicable or unspecified: Secondary | ICD-10-CM

## 2022-01-11 DIAGNOSIS — Z3A33 33 weeks gestation of pregnancy: Secondary | ICD-10-CM

## 2022-01-11 DIAGNOSIS — O09523 Supervision of elderly multigravida, third trimester: Secondary | ICD-10-CM

## 2022-01-11 DIAGNOSIS — O24113 Pre-existing diabetes mellitus, type 2, in pregnancy, third trimester: Secondary | ICD-10-CM

## 2022-01-16 ENCOUNTER — Ambulatory Visit (INDEPENDENT_AMBULATORY_CARE_PROVIDER_SITE_OTHER): Payer: Commercial Managed Care - HMO | Admitting: Obstetrics and Gynecology

## 2022-01-16 ENCOUNTER — Other Ambulatory Visit: Payer: Self-pay

## 2022-01-16 ENCOUNTER — Encounter: Payer: Self-pay | Admitting: Obstetrics and Gynecology

## 2022-01-16 VITALS — BP 134/89 | HR 97 | Wt 184.8 lb

## 2022-01-16 DIAGNOSIS — O329XX0 Maternal care for malpresentation of fetus, unspecified, not applicable or unspecified: Secondary | ICD-10-CM | POA: Insufficient documentation

## 2022-01-16 DIAGNOSIS — O0990 Supervision of high risk pregnancy, unspecified, unspecified trimester: Secondary | ICD-10-CM

## 2022-01-16 DIAGNOSIS — O24113 Pre-existing diabetes mellitus, type 2, in pregnancy, third trimester: Secondary | ICD-10-CM

## 2022-01-16 DIAGNOSIS — Z3A34 34 weeks gestation of pregnancy: Secondary | ICD-10-CM

## 2022-01-16 DIAGNOSIS — O09523 Supervision of elderly multigravida, third trimester: Secondary | ICD-10-CM

## 2022-01-16 DIAGNOSIS — O321XX0 Maternal care for breech presentation, not applicable or unspecified: Secondary | ICD-10-CM

## 2022-01-16 NOTE — Progress Notes (Signed)
Subjective:  Julie Brooks is a 36 y.o. [id number] at [redacted]w[redacted]d being seen today for ongoing prenatal care.  She is currently monitored for the following issues for this high-risk pregnancy and has History of preterm delivery; Supervision of high risk pregnancy, antepartum; AMA (advanced maternal age) multigravida 35+; Pre-existing type 2 diabetes mellitus during pregnancy in third trimester; Poorly controlled diabetes mellitus (HCC); Hypertriglyceridemia; and Malpresentation of fetus on their problem list.  Patient reports general discomforts of pregnancy. Contractions: Irregular. Vag. Bleeding: None.  Movement: Present. Denies leaking of fluid.   The following portions of the patient's history were reviewed and updated as appropriate: allergies, current medications, past family history, past medical history, past social history, past surgical history and problem list. Problem list updated.  Objective:   Vitals:   01/16/22 1452  BP: 134/89  Pulse: 97  Weight: 184 lb 12.8 oz (83.8 kg)    Fetal Status: Fetal Heart Rate (bpm): 152   Movement: Present     General:  Alert, oriented and cooperative. Patient is in no acute distress.  Skin: Skin is warm and dry. No rash noted.   Cardiovascular: Normal heart rate noted  Respiratory: Normal respiratory effort, no problems with respiration noted  Abdomen: Soft, gravid, appropriate for gestational age. Pain/Pressure: Present     Pelvic:  Cervical exam performed        Extremities: Normal range of motion.  Edema: Trace  Mental Status: Normal mood and affect. Normal behavior. Normal judgment and thought content.   Urinalysis:      Assessment and Plan:  Pregnancy: [id number] at [redacted]w[redacted]d  1. Supervision of high risk pregnancy, antepartum Stable GBS next visit  2. Pre-existing type 2 diabetes mellitus during pregnancy in third trimester Pt reports CBG readings from Dexacom do not match finger stick CBG's Will have pt to see Marylene Land ASAP for evaluation  and adjustment as need. CBG's are not in goal range, but not certain pt is following diet and using Dexacom correctly Growth 83 % on 11/22 Continue with weekly antenatal testing and serial growth scans as per MFM If CBG's remain uncontrolled, IOL at 37 weeks  3. Multigravida of advanced maternal age in third trimester Normal genetics  4. Breech presentation, single or unspecified fetus U/S as noted above  Preterm labor symptoms and general obstetric precautions including but not limited to vaginal bleeding, contractions, leaking of fluid and fetal movement were reviewed in detail with the patient. Please refer to After Visit Summary for other counseling recommendations.  Return for OB visit, face to face, MD only.   Hermina Staggers, MD

## 2022-01-16 NOTE — Patient Instructions (Signed)
Tercer trimestre de embarazo Third Trimester of Pregnancy  El tercer trimestre de embarazo va desde la semana 28 hasta la semana 40. Tambin se dice que va desde el mes 7 hasta el mes 9. En este trimestre, el beb en gestacin (feto) crece muy rpidamente. Hacia el final del noveno mes, el beb en gestacin mide alrededor de 20 pulgadas (45 cm) de largo. Pesa entre 6 y 10 libras (2,70 y 4,50 kg). Cambios en el cuerpo durante el tercer trimestre Su organismo contina atravesando por muchos cambios durante este perodo. Los cambios varan y generalmente vuelven a la normalidad despus del nacimiento del beb. Cambios fsicos Seguir aumentando de peso. Puede ser que aumente entre 25 y 35 libras (11 y 16 kg) hacia el final del embarazo. Si tiene bajo peso, puede aumentar entre 28 y 40 lb (unos 13 a 18 kg). Si tiene sobrepeso, puede aumentar entre 15 y 25 libras (unos 7 a 11 kg). Podrn aparecer las primeras estras en las caderas, el vientre (abdomen) y las mamas. Las mamas seguirn creciendo y pueden doler. Un lquido amarillo (calostro) puede salir de sus pechos. Esta es la primera leche que usted produce para el beb. Tal vez haya cambios en el cabello. El ombligo puede salir hacia afuera. Puede observar que se le hinchan ms las manos, la cara o los tobillos. Cambios en la salud Es posible que tenga acidez estomacal. Es posible que tenga dificultades para defecar (estreimiento). Pueden aparecerle hemorroides. Estas son venas hinchadas en el ano que pueden picar o doler. Puede comenzar a tener venas hinchadas (vrices) en las piernas. Puede presentar ms dolor en la pelvis, la espalda o los muslos. Puede presentar ms hormigueo o entumecimiento en las manos, los brazos y las piernas. La piel de su vientre tambin puede sentirse entumecida. Es posible que sienta falta de aire a medida que el tero se agranda. Otros cambios Es posible que haga pis (orine) con mayor frecuencia. Puede tener ms  problemas para dormir. Puede notar que el beb en gestacin "baja" o se mueve ms hacia bajo, en el vientre. Puede notar ms secrecin proveniente de la vagina. Puede sentir las articulaciones flojas y puede sentir dolor alrededor del hueso plvico. Siga estas instrucciones en su casa: Medicamentos Use los medicamentos de venta libre y los recetados solamente como se lo haya indicado el mdico. Algunos medicamentos no son seguros durante el embarazo. Tome vitaminas prenatales que contengan por lo menos 600 microgramos (mcg) de cido flico. Comida y bebida Consuma comidas saludables que incluyan lo siguiente: Frutas y verduras frescas. Cereales integrales. Buenas fuentes de protenas, como carne, huevos y tofu. Productos lcteos con bajo contenido de grasa. Evite la carne cruda y el jugo, la leche y el queso sin pasteurizar. Estos portan grmenes que pueden provocar dao tanto a usted como al beb. Tome 4 o 5 comidas pequeas en lugar de 3 comidas abundantes al da. Es posible que deba tomar medidas para prevenir o tratar los problemas para defecar: Beber suficiente lquido para mantener el pis (orina) de color amarillo plido. Come alimentos ricos en fibra. Entre ellos, frijoles, cereales integrales y frutas y verduras frescas. Limitar los alimentos con alto contenido de grasa y azcar. Estos incluyen alimentos fritos o dulces. Actividad Haga ejercicios solamente como se lo haya indicado el mdico. Interrumpa la actividad fsica si comienza a tener clicos en el tero. Evite levantar pesos excesivos. No haga ejercicio si hace demasiado calor, hay demasiada humedad o se encuentra en un lugar de mucha   altura (altitud elevada). Si lo desea, puede continuar teniendo relaciones sexuales, a menos que el mdico le indique lo contrario. Alivio del dolor y del malestar Haga pausas con frecuencia y descanse con las piernas levantadas (elevadas) si tiene calambres en las piernas o dolor en la parte  baja de la espalda. Dese baos de asiento con agua tibia para aliviar el dolor o las molestias causadas por las hemorroides. Use una crema para las hemorroides si el mdico la autoriza. Use un sostn que le brinde buen soporte si sus mamas estn sensibles. Si desarrolla venas hinchadas y abultadas en las piernas: Use medias de compresin segn las indicaciones de su mdico. Levante los pies durante 15 minutos, 3 o 4 veces por da. Limite la sal en sus alimentos. Seguridad Hable con el mdico antes de recorrer largas distancias. No se d baos de inmersin en agua caliente, baos turcos ni saunas. Use el cinturn de seguridad en todo momento mientras vaya en auto. Hable con el mdico si alguien le est haciendo dao o gritando mucho. Preparacin para la llegada del beb Para prepararse para la llegada de su beb: Tome clases prenatales. Visite el hospital y recorra el rea de maternidad. Compre un asiento de seguridad orientado hacia atrs para llevar al beb en el automvil. Aprenda cmo instalarlo en el auto. Prepare la habitacin del beb. Saque todas las almohadas y los animales de peluche de la cuna del beb. Instrucciones generales Evite el contacto con las bandejas sanitarias de los gatos y la tierra que estos animales usan. Estos contienen grmenes que pueden daar al beb y causar la prdida del beb ya sea aborto espontneo o muerte fetal. No se haga duchas vaginales ni use tampones. No use tampones ni toallas higinicas perfumadas. No fume ni consuma ningn producto que contenga nicotina o tabaco. Si necesita ayuda para dejar de fumar, consulte al mdico. No beba alcohol. No use medicamentos a base de hierbas, drogas ilegales, ni medicamentos que el mdico no haya autorizado. Las sustancias qumicas de estos productos pueden afectar al beb. Cumpla con todas las visitas de seguimiento. Esto es importante. Dnde buscar ms informacin American Pregnancy Association (Asociacin  Americana del Embarazo): americanpregnancy.org American College of Obstetricians and Gynecologists (Colegio Estadounidense de Obstetras y Gineclogos): www.acog.org Office on Women's Health (Oficina para la Salud de la Mujer): womenshealth.gov/pregnancy Comunquese con un mdico si: Tiene fiebre. Tiene clicos leves o siente presin en la parte baja del vientre. Sufre un dolor persistente en el abdomen. Vomita o hace deposiciones acuosas (diarrea). Advierte lquido con mal olor que proviene de la vagina. Siente dolor al orinar o hace orina con mal olor. Tiene un dolor de cabeza que no desaparece despus de tomar analgsicos. Nota cambios en la visin o ve manchas delante de los ojos. Solicite ayuda de inmediato si: Rompe la bolsa. Tiene contracciones regulares separadas por menos de 5 minutos. Tiene sangrado o pequeas prdidas vaginales. Tiene clicos o dolor muy intensos en el vientre. Tiene dificultad para respirar. Sientes dolor en el pecho. Se desmaya. No ha sentido al beb moverse durante el tiempo que le indic el mdico. Tiene dolor, hinchazn o enrojecimiento nuevos en un brazo o una pierna o se produce un aumento de alguno de estos sntomas. Resumen El tercer trimestre comprende desde la semana 28 hasta la semana 40 (desde el mes 7 hasta el mes 9). Esta es la poca en que el beb en gestacin crece muy rpidamente. Durante este perodo, las molestias pueden aumentar a medida que usted   sube de peso y el beb crece. Preprese para la llegada del beb: asista a las clases prenatales, compre un asiento de seguridad orientado hacia atrs para llevar al beb en auto y prepare la habitacin del beb. Solicite ayuda de inmediato si tiene sangrado por la vagina, siente dolor en el pecho y tiene dificultad para respirar, o si no ha sentido al beb moverse durante el tiempo que le indic el mdico. Esta informacin no tiene como fin reemplazar el consejo del mdico. Asegrese de hacerle al  mdico cualquier pregunta que tenga. Document Revised: 08/20/2019 Document Reviewed: 08/20/2019 Elsevier Patient Education  2023 Elsevier Inc.  

## 2022-01-17 ENCOUNTER — Encounter: Payer: Commercial Managed Care - HMO | Admitting: Family Medicine

## 2022-01-19 ENCOUNTER — Ambulatory Visit: Payer: Managed Care, Other (non HMO) | Admitting: *Deleted

## 2022-01-19 ENCOUNTER — Ambulatory Visit: Payer: Managed Care, Other (non HMO) | Attending: Obstetrics

## 2022-01-19 VITALS — BP 114/77 | HR 93

## 2022-01-19 DIAGNOSIS — Z362 Encounter for other antenatal screening follow-up: Secondary | ICD-10-CM

## 2022-01-19 DIAGNOSIS — O99213 Obesity complicating pregnancy, third trimester: Secondary | ICD-10-CM | POA: Insufficient documentation

## 2022-01-19 DIAGNOSIS — O09523 Supervision of elderly multigravida, third trimester: Secondary | ICD-10-CM | POA: Diagnosis not present

## 2022-01-19 DIAGNOSIS — O24113 Pre-existing diabetes mellitus, type 2, in pregnancy, third trimester: Secondary | ICD-10-CM

## 2022-01-19 DIAGNOSIS — O0990 Supervision of high risk pregnancy, unspecified, unspecified trimester: Secondary | ICD-10-CM | POA: Insufficient documentation

## 2022-01-19 DIAGNOSIS — O365930 Maternal care for other known or suspected poor fetal growth, third trimester, not applicable or unspecified: Secondary | ICD-10-CM

## 2022-01-19 DIAGNOSIS — Z7984 Long term (current) use of oral hypoglycemic drugs: Secondary | ICD-10-CM

## 2022-01-19 DIAGNOSIS — Z794 Long term (current) use of insulin: Secondary | ICD-10-CM

## 2022-01-19 DIAGNOSIS — O09213 Supervision of pregnancy with history of pre-term labor, third trimester: Secondary | ICD-10-CM | POA: Diagnosis not present

## 2022-01-19 DIAGNOSIS — E119 Type 2 diabetes mellitus without complications: Secondary | ICD-10-CM | POA: Diagnosis not present

## 2022-01-19 DIAGNOSIS — Z3A34 34 weeks gestation of pregnancy: Secondary | ICD-10-CM

## 2022-01-19 DIAGNOSIS — E669 Obesity, unspecified: Secondary | ICD-10-CM

## 2022-01-19 DIAGNOSIS — O24913 Unspecified diabetes mellitus in pregnancy, third trimester: Secondary | ICD-10-CM | POA: Insufficient documentation

## 2022-01-23 ENCOUNTER — Other Ambulatory Visit: Payer: Self-pay

## 2022-01-23 ENCOUNTER — Encounter (HOSPITAL_COMMUNITY): Payer: Self-pay | Admitting: Obstetrics & Gynecology

## 2022-01-23 ENCOUNTER — Inpatient Hospital Stay (HOSPITAL_COMMUNITY)
Admission: AD | Admit: 2022-01-23 | Discharge: 2022-01-23 | Disposition: A | Discharge status: Home / Self Care | Payer: Managed Care, Other (non HMO) | Attending: Obstetrics & Gynecology | Admitting: Obstetrics & Gynecology

## 2022-01-23 ENCOUNTER — Telehealth: Payer: Self-pay

## 2022-01-23 DIAGNOSIS — O479 False labor, unspecified: Secondary | ICD-10-CM

## 2022-01-23 DIAGNOSIS — O24113 Pre-existing diabetes mellitus, type 2, in pregnancy, third trimester: Secondary | ICD-10-CM | POA: Diagnosis not present

## 2022-01-23 DIAGNOSIS — O4703 False labor before 37 completed weeks of gestation, third trimester: Secondary | ICD-10-CM | POA: Diagnosis not present

## 2022-01-23 DIAGNOSIS — O4693 Antepartum hemorrhage, unspecified, third trimester: Secondary | ICD-10-CM | POA: Diagnosis present

## 2022-01-23 DIAGNOSIS — Z3A35 35 weeks gestation of pregnancy: Secondary | ICD-10-CM | POA: Insufficient documentation

## 2022-01-23 LAB — URINALYSIS, ROUTINE W REFLEX MICROSCOPIC
Bilirubin Urine: NEGATIVE
Glucose, UA: NEGATIVE mg/dL
Hgb urine dipstick: NEGATIVE
Ketones, ur: NEGATIVE mg/dL
Nitrite: NEGATIVE
Protein, ur: NEGATIVE mg/dL
Specific Gravity, Urine: 1.011 (ref 1.005–1.030)
pH: 6 (ref 5.0–8.0)

## 2022-01-23 LAB — GLUCOSE, CAPILLARY: Glucose-Capillary: 115 mg/dL — ABNORMAL HIGH (ref 70–99)

## 2022-01-23 NOTE — MAU Note (Signed)
Julie Brooks is a 36 y.o. at [redacted]w[redacted]d here in MAU reporting: started having contractions since Friday. On Saturday noticed some brown spotting. Yesterday had contractions every 5 minutes for a few hours and then it stopped. Today contractions are back and is seeing brown discharge. They are every 5 minutes. No LOF. +FM  Onset of complaint: ongoing  Pain score: 10/10  Vitals:   01/23/22 1117  BP: 111/64  Pulse: 84  Resp: 18  Temp: 98.1 F (36.7 C)  SpO2: 97%     FHT:166  Lab orders placed from triage: UA

## 2022-01-23 NOTE — Telephone Encounter (Signed)
Patient called office complaining of contractions and bleeding.Transferred patient from front office to me with Debarah Crape for Spanish interpretation.   After speaking with the patient, she reported maroon-colored bleeding with wiping that started on Saturday and has continued through today. Patient noted on Saturday that she was having contractions that were 15-20 minutes apart that lasted. On Sunday, started again at 1100 and stopped at 1500. Then at 1900, contractions started again with increased feeling of pain and pressure in her vagina that were 5-7 minutes apart; contractions stopped again at 0000.   This morning patient reports that contractions started again at 0800, every 5 minutes now, with continued increased pressure and vaginal pain and persisting maroon-colored bleeding with wiping.   Patient denies any leaking of clear fluid; patient also has history of pre-term delivery x2 (one at 36w [place] in 2008 and another in 2019 at 35w [place]).  Advised patient head to MAU for further evaluation. Patient verbalized understanding.   Maureen Ralphs RN 01/23/22 at [phone]

## 2022-01-23 NOTE — MAU Provider Note (Signed)
History     [id number]  Arrival date and time: 01/23/22 1102    Chief Complaint  Patient presents with   Contractions   Vaginal Bleeding     HPI Julie Brooks is a 36 y.o. at 61w0dwho presents for contractions & vaginal discharge. Reports contractions for the last month that have become gradually more intense & frequent since Friday. Currently reports contractions every 5 minutes. Has noticed brown blood when she wipes since the weekend. Has also noticed a thick white vaginal discharge. Denies vaginal itching, irritation, or odor. Denies vomiting, diarrhea, dysuria, or recent intercourse. Reports good fetal movement.    OB History     Gravida  5   Para  2   Term  0   Preterm  2   AB  2   Living  2      SAB  2   IAB  0   Ectopic  0   Multiple  0   Live Births  2           Past Medical History:  Diagnosis Date   Back pain    Hypertriglyceridemia 11/25/2020   09/29/2020   Placenta previa antepartum in third trimester 09/10/2021   10/05/21 at [facility]    Poorly controlled diabetes mellitus ([facility] 09/02/2021   A1c 10.3 at 8wks    Past Surgical History:  Procedure Laterality Date   APPENDECTOMY     DILATION AND CURETTAGE OF UTERUS  2004    Family History  Problem Relation Age of Onset   Diabetes Mother    Hypertension Mother    Diabetes Father    Diabetes Brother    Asthma Daughter    Breast cancer Paternal Aunt    Prostate cancer Paternal Uncle    Cancer Neg Hx    Heart disease Neg Hx     Allergies  Allergen Reactions   Azithromycin Swelling    Possible reaction to current medication   Fioricet [Butalbital-Apap-Caffeine] Swelling    Possible reaction to current medication    No current facility-administered medications on file prior to encounter.   Current Outpatient Medications on File Prior to Encounter  Medication Sig Dispense Refill   Accu-Chek Softclix Lancets lancets To check blood sugars 4 times a day. Fasting and 2  hours after the first bite of breakfast, lunch and dinner. 100 each 12   aspirin EC 81 MG tablet Take 1 tablet (81 mg total) by mouth daily. Swallow whole. 30 tablet 12   Blood Glucose Monitoring Suppl (ACCU-CHEK GUIDE) w/Device KIT 1 Device by Does not apply route as needed. 1 kit 0   Continuous Blood Gluc Sensor (DEXCOM G6 SENSOR) MISC Change every 10 days. Use with Dexcom 1 each 6   Continuous Blood Gluc Transmit (DEXCOM G6 TRANSMITTER) MISC 1 Device by Does not apply route every 3 (three) months. 3 each 1   glucose blood (ACCU-CHEK GUIDE) test strip Use as instructed 100 each 12   Insulin Disposable Pump (OMNIPOD 5 G6 POD, GEN 5,) MISC 1 Device by Does not apply route every 3 (three) days. 3 each 5   insulin lispro (HUMALOG) 100 UNIT/ML injection For use in Omnipod, current rate is 24 units a day as directed . TMD 7221m10 mL 11   metFORMIN (GLUCOPHAGE) 1000 MG tablet Take 1 tablet (1,000 mg total) by mouth 2 (two) times daily with a meal. 60 tablet 11   Prenatal Vit-Fe Fumarate-FA (PRENATAL VITAMIN) 27-0.8 MG TABS Take 1 tablet  by mouth daily. 30 tablet 11   insulin NPH Human (NOVOLIN N) 100 UNIT/ML injection Inject 0.1 mLs (10 Units total) into the skin 2 (two) times daily before a meal. (Patient not taking: Reported on 01/11/2022) 10 mL 3   Insulin Syringe-Needle U-100 (INSULIN SYRINGE .5CC/31GX5/16") 31G X 5/16" 0.5 ML MISC 1 Syringe by Does not apply route 2 (two) times daily. 100 each 11     ROS Pertinent positives and negative per HPI, all others reviewed and negative  Physical Exam   BP 121/63 (BP Location: Left Arm)   Pulse 89   Temp 98.3 F (36.8 C) (Oral)   Resp 20   Wt 84.5 kg   LMP 05/23/2021   SpO2 98%   BMI 34.61 kg/m   Patient Vitals for the past 24 hrs:  BP Temp Temp src Pulse Resp SpO2 Weight  01/23/22 1302 121/63 98.3 F (36.8 C) Oral 89 20 98 % --  01/23/22 1141 113/70 -- -- 88 18 98 % --  01/23/22 1132 121/68 98.2 F (36.8 C) Oral 87 18 98 % --  01/23/22  1117 111/64 98.1 F (36.7 C) Oral 84 18 97 % --  01/23/22 1111 -- -- -- -- -- -- 84.5 kg    Physical Exam Vitals and nursing note reviewed. Exam conducted with a chaperone present.  Constitutional:      General: She is not in acute distress.    Appearance: Normal appearance.  HENT:     Head: Normocephalic and atraumatic.  Eyes:     General: No scleral icterus.    Conjunctiva/sclera: Conjunctivae normal.  Pulmonary:     Effort: Pulmonary effort is normal. No respiratory distress.  Abdominal:     Palpations: Abdomen is soft.     Tenderness: There is no abdominal tenderness.  Genitourinary:    Comments: No blood or abnormal discharge Skin:    General: Skin is warm and dry.  Neurological:     Mental Status: She is alert.  Psychiatric:        Mood and Affect: Mood normal.        Behavior: Behavior normal.      Cervical Exam Dilation: Closed Effacement (%): Thick Cervical Position: Posterior Station: -3 Exam by:: Purcell Nails, NP   FHT Baseline 145, 15x15 variability, no accels, no decels Toco: Q 2-6 minutes Cat: 1  Labs Results for orders placed or performed during the hospital encounter of 01/23/22 (from the past 24 hour(s))  Glucose, capillary     Status: Abnormal   Collection Time: 01/23/22 11:28 AM  Result Value Ref Range   Glucose-Capillary 115 (H) 70 - 99 mg/dL   Comment 1 Document in Chart   Urinalysis, Routine w reflex microscopic Urine, Clean Catch     Status: Abnormal   Collection Time: 01/23/22 11:59 AM  Result Value Ref Range   Color, Urine YELLOW YELLOW   APPearance CLOUDY (A) CLEAR   Specific Gravity, Urine 1.011 1.005 - 1.030   pH 6.0 5.0 - 8.0   Glucose, UA NEGATIVE NEGATIVE mg/dL   Hgb urine dipstick NEGATIVE NEGATIVE   Bilirubin Urine NEGATIVE NEGATIVE   Ketones, ur NEGATIVE NEGATIVE mg/dL   Protein, ur NEGATIVE NEGATIVE mg/dL   Nitrite NEGATIVE NEGATIVE   Leukocytes,Ua LARGE (A) NEGATIVE   RBC / HPF 0-5 0 - 5 RBC/hpf   WBC, UA 6-10 0 - 5  WBC/hpf   Bacteria, UA FEW (A) NONE SEEN   Squamous Epithelial / LPF 11-20 0 - 5  Mucus PRESENT     Imaging No results found.  MAU Course  Procedures Lab Orders         Culture, OB Urine         Urinalysis, Routine w reflex microscopic Urine, Clean Catch         Glucose, capillary    No orders of the defined types were placed in this encounter.  Imaging Orders  No imaging studies ordered today    MDM Cervix closed/thick/posterior. Unchanged after 1 hour of monitoring.   U/a with large leuks. Denies urinary complaints. Will send for culture Assessment and Plan   1. Braxton Hick's contraction   2. [redacted] weeks gestation of pregnancy     -Urine culture pending -Reviewed preterm labor precautions & reasons to return to MAU -Keep scheduled appointments with OB this week  Jorje Guild, NP 01/23/22 1:34 PM

## 2022-01-24 ENCOUNTER — Encounter: Payer: Managed Care, Other (non HMO) | Attending: Obstetrics and Gynecology | Admitting: Registered"

## 2022-01-24 ENCOUNTER — Ambulatory Visit (INDEPENDENT_AMBULATORY_CARE_PROVIDER_SITE_OTHER): Payer: Managed Care, Other (non HMO) | Admitting: Registered"

## 2022-01-24 DIAGNOSIS — R7309 Other abnormal glucose: Secondary | ICD-10-CM | POA: Diagnosis present

## 2022-01-24 DIAGNOSIS — O24319 Unspecified pre-existing diabetes mellitus in pregnancy, unspecified trimester: Secondary | ICD-10-CM | POA: Insufficient documentation

## 2022-01-24 DIAGNOSIS — O24113 Pre-existing diabetes mellitus, type 2, in pregnancy, third trimester: Secondary | ICD-10-CM

## 2022-01-24 DIAGNOSIS — Z3A35 35 weeks gestation of pregnancy: Secondary | ICD-10-CM

## 2022-01-24 DIAGNOSIS — Z3A00 Weeks of gestation of pregnancy not specified: Secondary | ICD-10-CM | POA: Diagnosis not present

## 2022-01-24 DIAGNOSIS — Z713 Dietary counseling and surveillance: Secondary | ICD-10-CM | POA: Diagnosis not present

## 2022-01-24 LAB — CULTURE, OB URINE
Culture: NO GROWTH
Special Requests: NORMAL

## 2022-01-26 ENCOUNTER — Other Ambulatory Visit: Payer: Self-pay

## 2022-01-26 ENCOUNTER — Ambulatory Visit (INDEPENDENT_AMBULATORY_CARE_PROVIDER_SITE_OTHER): Payer: Managed Care, Other (non HMO) | Admitting: Registered"

## 2022-01-26 ENCOUNTER — Ambulatory Visit: Payer: Managed Care, Other (non HMO) | Attending: Obstetrics

## 2022-01-26 ENCOUNTER — Encounter: Payer: Managed Care, Other (non HMO) | Attending: Obstetrics and Gynecology | Admitting: Registered"

## 2022-01-26 ENCOUNTER — Ambulatory Visit: Payer: Managed Care, Other (non HMO) | Admitting: *Deleted

## 2022-01-26 VITALS — BP 128/73 | HR 107

## 2022-01-26 DIAGNOSIS — Z3A00 Weeks of gestation of pregnancy not specified: Secondary | ICD-10-CM | POA: Insufficient documentation

## 2022-01-26 DIAGNOSIS — Z713 Dietary counseling and surveillance: Secondary | ICD-10-CM | POA: Diagnosis not present

## 2022-01-26 DIAGNOSIS — Z3A35 35 weeks gestation of pregnancy: Secondary | ICD-10-CM

## 2022-01-26 DIAGNOSIS — E669 Obesity, unspecified: Secondary | ICD-10-CM

## 2022-01-26 DIAGNOSIS — O24113 Pre-existing diabetes mellitus, type 2, in pregnancy, third trimester: Secondary | ICD-10-CM | POA: Insufficient documentation

## 2022-01-26 DIAGNOSIS — O0990 Supervision of high risk pregnancy, unspecified, unspecified trimester: Secondary | ICD-10-CM | POA: Insufficient documentation

## 2022-01-26 DIAGNOSIS — O09523 Supervision of elderly multigravida, third trimester: Secondary | ICD-10-CM

## 2022-01-26 DIAGNOSIS — O99213 Obesity complicating pregnancy, third trimester: Secondary | ICD-10-CM | POA: Diagnosis present

## 2022-01-26 DIAGNOSIS — O4440 Low lying placenta NOS or without hemorrhage, unspecified trimester: Secondary | ICD-10-CM | POA: Diagnosis present

## 2022-01-26 DIAGNOSIS — O24913 Unspecified diabetes mellitus in pregnancy, third trimester: Secondary | ICD-10-CM | POA: Diagnosis present

## 2022-01-26 DIAGNOSIS — O09213 Supervision of pregnancy with history of pre-term labor, third trimester: Secondary | ICD-10-CM | POA: Diagnosis not present

## 2022-01-26 NOTE — Progress Notes (Signed)
[employer] video Interpreter services provided by [employer] # [id number]  Patient was seen on 01/24/22 for issues with Dexcom G6 readings.  Patient reports CBG readings are lower than CGM readings.  Pt was advised to calibrate Dexcom through the phone app when blood sugar is running steady and not after eating.   Pt states she has been having contractions and in a lot of pain.  Pt states she has been able to eat and using the insulin pump for bolus at meal times.   Pt is in automated mode and appears is working well to maintain blood sugar. Hypoglycemic events are likely due to sensor pressure as they occur at times when not getting consistent dexcom signal.     Plan:  Calibrate Dexcom G6 app with fasting blood sugar finger stick Return for re-evaluation of pump and CGM   Patient instructed to monitor glucose levels: FBS: 70 - 95 mg/dl 2 hour: <825 mg/dl  Patient will be seen for follow-up in 2 days or as needed.

## 2022-01-26 NOTE — Progress Notes (Addendum)
[employer] Interpreter services provided by [employer] [phone]  Patient was seen on 01/26/22 for follow-up troubleshooting Dexcom and reviewing blood sugar.  Start 918   End 945  Pt states she did not calibrate her Dexcom sensor because she didn't remember how to do it. Appears Dexcom is still running high. Pt states she is in a lot of discomfort due to contractions but is not taking Tylenol for the pain. Pt has an Ultrasound appointment later this morning.     The following learning objectives reviewed during follow-up visit:  Using CGM to take correction bolus  Plan:  Consider using the correction bolus function on you insulin pump 1 hour after meals if blood sugar is above 140 at that time. You can calibrate your Dexcom with your fasting blood sugar finger stick in the morning.  Patient instructed to monitor glucose levels: FBS: 60 - 95 mg/dl 2 hour: <741 mg/dl  Patient received the following handouts: none  Patient will be seen for follow-up as needed.

## 2022-01-31 ENCOUNTER — Telehealth (HOSPITAL_COMMUNITY): Payer: Self-pay | Admitting: *Deleted

## 2022-01-31 ENCOUNTER — Encounter: Payer: Self-pay | Admitting: Family Medicine

## 2022-01-31 ENCOUNTER — Other Ambulatory Visit: Payer: Self-pay

## 2022-01-31 ENCOUNTER — Encounter (HOSPITAL_COMMUNITY): Payer: Self-pay | Admitting: *Deleted

## 2022-01-31 ENCOUNTER — Other Ambulatory Visit (HOSPITAL_COMMUNITY)
Admission: RE | Admit: 2022-01-31 | Discharge: 2022-01-31 | Disposition: A | Discharge status: Home / Self Care | Payer: Managed Care, Other (non HMO) | Source: Ambulatory Visit | Attending: Family Medicine | Admitting: Family Medicine

## 2022-01-31 ENCOUNTER — Ambulatory Visit (INDEPENDENT_AMBULATORY_CARE_PROVIDER_SITE_OTHER): Payer: Managed Care, Other (non HMO) | Admitting: Family Medicine

## 2022-01-31 VITALS — BP 142/87 | HR 97 | Wt 191.1 lb

## 2022-01-31 DIAGNOSIS — O0990 Supervision of high risk pregnancy, unspecified, unspecified trimester: Secondary | ICD-10-CM | POA: Insufficient documentation

## 2022-01-31 DIAGNOSIS — O24113 Pre-existing diabetes mellitus, type 2, in pregnancy, third trimester: Secondary | ICD-10-CM

## 2022-01-31 DIAGNOSIS — Z3A36 36 weeks gestation of pregnancy: Secondary | ICD-10-CM

## 2022-01-31 DIAGNOSIS — R030 Elevated blood-pressure reading, without diagnosis of hypertension: Secondary | ICD-10-CM

## 2022-01-31 DIAGNOSIS — O321XX0 Maternal care for breech presentation, not applicable or unspecified: Secondary | ICD-10-CM

## 2022-01-31 DIAGNOSIS — O09523 Supervision of elderly multigravida, third trimester: Secondary | ICD-10-CM

## 2022-01-31 DIAGNOSIS — Z8751 Personal history of pre-term labor: Secondary | ICD-10-CM

## 2022-01-31 NOTE — Telephone Encounter (Signed)
Preadmission screen interpreter number [phone]

## 2022-01-31 NOTE — Patient Instructions (Signed)
Eleccin del mtodo anticonceptivo Contraception Choices La anticoncepcin, o los mtodos anticonceptivos, hace referencia a los mtodos o dispositivos que evitan el embarazo. Mtodos hormonales  Implante anticonceptivo Un implante anticonceptivo consiste en un tubo delgado de plstico que contiene una hormona que evita el embarazo. Es diferente de un dispositivo intrauterino (DIU). Un mdico lo inserta en la parte superior del brazo. Los implantes pueden ser eficaces durante un mximo de 3 aos. Inyecciones de progestina sola Las inyecciones de progestina sola contienen progestina, una forma sinttica de la hormona progesterona. Un mdico las administra cada 3 meses. Pldoras anticonceptivas Las pldoras anticonceptivas son pastillas que contienen hormonas que evitan el embarazo. Deben tomarse una vez al da, preferentemente a la misma hora cada da. Se necesita una receta para utilizar este mtodo anticonceptivo. Parche anticonceptivo El parche anticonceptivo contiene hormonas que evitan el embarazo. Se coloca en la piel, debe cambiarse una vez a la semana durante tres semanas y debe retirarse en la cuarta semana. Se necesita una receta para utilizar este mtodo anticonceptivo. Anillo vaginal Un anillo vaginal contiene hormonas que evitan el embarazo. Se coloca en la vagina durante tres semanas y se retira en la cuarta semana. Luego se repite el proceso con un anillo nuevo. Se necesita una receta para utilizar este mtodo anticonceptivo. Anticonceptivo de emergencia Los anticonceptivos de emergencia son mtodos para evitar un embarazo despus de tener sexo sin proteccin. Vienen en forma de pldora y pueden tomarse hasta 5 das despus de tener sexo. Funcionan mejor cuando se toman lo ms pronto posible luego de tener sexo. La mayora de los anticonceptivos de emergencia estn disponibles sin receta mdica. Este mtodo no debe utilizarse como el nico mtodo anticonceptivo. Mtodos de  barrera  Condn masculino Un condn masculino es una vaina delgada que se coloca sobre el pene durante el sexo. Los condones evitan que el esperma ingrese en el cuerpo de la mujer. Pueden utilizarse con un una sustancia que mata a los espermatozoides (espermicida) para aumentar la efectividad. Deben desecharse despus de un uso. Condn femenino Un condn femenino es una vaina blanda y holgada que se coloca en la vagina antes de tener sexo. El condn evita que el esperma ingrese en el cuerpo de la mujer. Deben desecharse despus de un uso. Diafragma Un diafragma es una barrera blanda con forma de cpula. Se inserta en la vagina antes del sexo, junto con un espermicida. El diafragma bloquea el ingreso de esperma en el tero, y el espermicida mata a los espermatozoides. El diafragma debe permanecer en la vagina durante 6 a 8 horas despus de tener sexo y debe retirarse en el plazo de las 24 horas. Un diafragma es recetado y colocado por un mdico. Debe reemplazarse cada 1 a 2 aos, despus de dar a luz, de aumentar ms de 15lb (6.8kg) y de una ciruga plvica. Capuchn cervical Un capuchn cervical es una copa redonda y blanda de ltex o plstico que se coloca en el cuello uterino. Se inserta en la vagina antes del sexo, junto con un espermicida. Bloquea el ingreso del esperma en el tero. El capuchn debe permanecer en el lugar durante 6 a 8 horas despus de tener sexo y debe retirarse en el plazo de las 48 horas. Un capuchn cervical debe ser recetado y colocado por un mdico. Debe reemplazarse cada 2aos. Esponja Una esponja es una pieza blanda y circular de espuma de poliuretano que contiene espermicida. La esponja ayuda a bloquear el ingreso de esperma en el tero, y el espermicida   mata a los espermatozoides. Para utilizarla, debe humedecerla e insertarla en la vagina. Debe insertarse antes de tener sexo, debe permanecer dentro al menos durante 6 horas despus de tener sexo y debe retirarse y  desecharse en el plazo de las 30 horas. Espermicidas Los espermicidas son sustancias qumicas que matan o bloquean al esperma y no lo dejan ingresar al cuello uterino y al tero. Vienen en forma de crema, gel, supositorio, espuma o comprimido. Un espermicida debe insertarse en la vagina con un aplicador al menos 10 o 15 minutos antes de tener sexo para dar tiempo a que surta efecto. El proceso debe repetirse cada vez que tenga sexo. Los espermicidas no requieren receta mdica. Anticonceptivos intrauterinos Dispositivo intrauterino (DIU) Un DIU es un dispositivo en forma de T que se coloca en el tero. Existen dos tipos: DIU hormonal.Este tipo contiene progestina, una forma sinttica de la hormona progesterona. Este tipo puede permanecer colocado durante 3 a 5 aos. DIU de cobre.Este tipo est recubierto con un alambre de cobre. Puede permanecer colocado durante 10 aos. Mtodos anticonceptivos permanentes Ligadura de trompas en la mujer En este mtodo, se sellan, atan u obstruyen las trompas de Falopio durante una ciruga para evitar que el vulo descienda hacia el tero. Esterilizacin histeroscpica En este mtodo, se coloca un implante pequeo y flexible dentro de cada trompa de Falopio. Los implantes hacen que se forme un tejido cicatricial en las trompas de Falopio y que las obstruya para que el espermatozoide no pueda llegar al vulo. El procedimiento demora alrededor de 3 meses para que sea efectivo. Debe utilizarse otro mtodo anticonceptivo durante esos 3 meses. Esterilizacin masculina Este es un procedimiento que consiste en atar los conductos que transportan el esperma (vasectoma). Luego del procedimiento, el hombre puede eyacular lquido (semen). Debe utilizarse otro mtodo anticonceptivo durante 3 meses despus del procedimiento. Mtodos de planificacin natural Planificacin familiar natural En este mtodo, la pareja no tiene sexo durante los das en que la mujer podra quedar  embarazada. Mtodo calendario En este mtodo, la mujer realiza un seguimiento de la duracin de cada ciclo menstrual, identifica los das en los que se puede producir un embarazo y no tiene sexo durante esos das. Mtodo de la ovulacin En este mtodo, la pareja evita tener sexo durante la ovulacin. Mtodo sintotrmico Este mtodo implica no tener sexo durante la ovulacin. Normalmente, la mujer comprueba la ovulacin al observar cambios en su temperatura y en la consistencia del moco cervical. Mtodo posovulacin En este mtodo, la pareja espera a que finalice la ovulacin para tener sexo. Dnde buscar ms informacin Centers for Disease Control and Prevention (Centros para el Control y la Prevencin de Enfermedades): www.cdc.gov Resumen La anticoncepcin, o los mtodos anticonceptivos, hace referencia a los mtodos o dispositivos que evitan el embarazo. Los mtodos anticonceptivos hormonales incluyen implantes, inyecciones, pastillas, parches, anillos vaginales y anticonceptivos de emergencia. Los mtodos anticonceptivos de barrera pueden incluir condones masculinos, condones femeninos, diafragmas, capuchones cervicales, esponjas y espermicidas. Existen dos tipos de DIU (dispositivo intrauterino). Un DIU puede colocarse en el tero de una mujer para evitar el embarazo durante 3 a 5 aos. La esterilizacin permanente puede realizarse mediante un procedimiento tanto en los hombres como en las mujeres. Los mtodos de planificacin familiar natural implican no tener sexo durante los das en que la mujer podra quedar embarazada. Esta informacin no tiene como fin reemplazar el consejo del mdico. Asegrese de hacerle al mdico cualquier pregunta que tenga. Document Revised: 09/09/2019 Document Reviewed: 09/09/2019 Elsevier Patient Education    2023 [employer] Inc.  

## 2022-01-31 NOTE — Progress Notes (Addendum)
Subjective:  Julie Brooks is a 36 y.o. [phone] at [redacted]w[redacted]d being seen today for ongoing prenatal care.  She is currently monitored for the following issues for this high-risk pregnancy and has History of preterm delivery; Supervision of high risk pregnancy, antepartum; AMA (advanced maternal age) multigravida 35+; Pre-existing type 2 diabetes mellitus during pregnancy in third trimester; Poorly controlled diabetes mellitus (HCC); Hypertriglyceridemia; and Malpresentation of fetus on their problem list.  Patient reports  being uncomfortable .  Contractions: Irregular.  .  Movement: Present. Denies leaking of fluid.   The following portions of the patient's history were reviewed and updated as appropriate: allergies, current medications, past family history, past medical history, past social history, past surgical history and problem list. Problem list updated.  Objective:   Vitals:   01/31/22 0913  BP: (!) 142/87  Pulse: 97  Weight: 191 lb 1.6 oz (86.7 kg)    Fetal Status: Fetal Heart Rate (bpm): 143   Movement: Present  Presentation: Undeterminable  General:  Alert, oriented and cooperative. Patient is in no acute distress.  Skin: Skin is warm and dry. No rash noted.   Cardiovascular: Normal heart rate noted  Respiratory: Normal respiratory effort, no problems with respiration noted  Abdomen: Soft, gravid, appropriate for gestational age. Pain/Pressure: Present     Pelvic:       Cervical exam performed Dilation: 3 Effacement (%): Thick Station: Ballotable  Extremities: Normal range of motion.     Mental Status: Normal mood and affect. Normal behavior. Normal judgment and thought content.   Urinalysis:        Assessment and Plan:  Pregnancy: [id number] at [redacted]w[redacted]d  1. Supervision of high risk pregnancy, antepartum BP elevated, see below FHR normal Cervix already 3 cm but thick Swabs collected today Patient desires to come out of work, given how she is feeling and upcoming  delivery I think this is very reasonable - GC/Chlamydia probe amp ([place])not at [facility] - Culture, beta strep (group b only)  2. Pre-existing type 2 diabetes mellitus during pregnancy in third trimester Currently using insulin pump, settings are currently auto mode for basal and bolus 1u for snacks and 2u for meals Sugars remain largely uncontrolled though not excessively so, see log above Recommend increasing meal bolus to 3u Last growth [place] 01/11/2022, AFI 20, posterior placenta, EFW 83%, AC 99% Fetal echo done 10/25/2021 was normal Discussed given poor control, elevated BP today, recommend delivery at 37 weeks She is in agreement with this plan Will plan for ECV attempt, see below  3. History of preterm delivery At 35 and 36 weeks  4. Multigravida of advanced maternal age in third trimester   5. Breech presentation, single or unspecified fetus Not vertex on exam though exact position uncertain Discussed ECV with about 1% risk of fetal intolerance due to various complications (cord accident, placental accident, etc) After discussion she would like to attempt ECV Scheduled for 02/06/2022 with either IOL or scheduled cesarean afterwards given above indication of uncontrolled T2DM at term and newly elevated BP's  6. Elevated blood pressure without diagnosis of hypertension First elevated BP this pregnancy Asymptomatic PreE labs obtained, there are baselines to compare to from 07/2021  Preterm labor symptoms and general obstetric precautions including but not limited to vaginal bleeding, contractions, leaking of fluid and fetal movement were reviewed in detail with the patient. Please refer to After Visit Summary for other counseling recommendations.  Return in 1 week (on 02/07/2022) for [facility], ob visit, needs MD.  Venora Maples, MD

## 2022-02-01 ENCOUNTER — Ambulatory Visit (INDEPENDENT_AMBULATORY_CARE_PROVIDER_SITE_OTHER): Payer: Managed Care, Other (non HMO)

## 2022-02-01 ENCOUNTER — Ambulatory Visit: Payer: Managed Care, Other (non HMO) | Admitting: *Deleted

## 2022-02-01 ENCOUNTER — Other Ambulatory Visit: Payer: Self-pay | Admitting: Advanced Practice Midwife

## 2022-02-01 VITALS — BP 110/79 | HR 89

## 2022-02-01 DIAGNOSIS — Z3A36 36 weeks gestation of pregnancy: Secondary | ICD-10-CM | POA: Diagnosis not present

## 2022-02-01 DIAGNOSIS — O24113 Pre-existing diabetes mellitus, type 2, in pregnancy, third trimester: Secondary | ICD-10-CM | POA: Diagnosis not present

## 2022-02-01 LAB — COMPREHENSIVE METABOLIC PANEL
ALT: 7 IU/L (ref 0–32)
AST: 11 IU/L (ref 0–40)
Albumin/Globulin Ratio: 1.1 — ABNORMAL LOW (ref 1.2–2.2)
Albumin: 3.4 g/dL — ABNORMAL LOW (ref 3.9–4.9)
Alkaline Phosphatase: 148 IU/L — ABNORMAL HIGH (ref 44–121)
BUN/Creatinine Ratio: 12 (ref 9–23)
BUN: 7 mg/dL (ref 6–20)
Bilirubin Total: <0.2 mg/dL (ref 0.0–1.2)
CO2: 19 mmol/L — ABNORMAL LOW (ref 20–29)
Calcium: 9.5 mg/dL (ref 8.7–10.2)
Chloride: 103 mmol/L (ref 96–106)
Creatinine, Ser: 0.57 mg/dL (ref 0.57–1.00)
Globulin, Total: 3.1 g/dL (ref 1.5–4.5)
Glucose: 161 mg/dL — ABNORMAL HIGH (ref 70–99)
Potassium: 4.2 mmol/L (ref 3.5–5.2)
Sodium: 139 mmol/L (ref 134–144)
Total Protein: 6.5 g/dL (ref 6.0–8.5)
eGFR: 119 mL/min/{1.73_m2} (ref >59)

## 2022-02-01 LAB — GC/CHLAMYDIA PROBE AMP (~~LOC~~) NOT AT ARMC
Chlamydia: NEGATIVE
Comment: NEGATIVE
Comment: NORMAL
Neisseria Gonorrhea: NEGATIVE

## 2022-02-01 LAB — PROTEIN / CREATININE RATIO, URINE
Creatinine, Urine: 30.2 mg/dL
Protein, Ur: 12.7 mg/dL
Protein/Creat Ratio: 421 mg/g creat — ABNORMAL HIGH (ref 0–200)

## 2022-02-01 LAB — CBC
Hematocrit: 35.6 % (ref 34.0–46.6)
Hemoglobin: 11.5 g/dL (ref 11.1–15.9)
MCH: 24.8 pg — ABNORMAL LOW (ref 26.6–33.0)
MCHC: 32.3 g/dL (ref 31.5–35.7)
MCV: 77 fL — ABNORMAL LOW (ref 79–97)
Platelets: 261 10*3/uL (ref 150–450)
RBC: 4.63 x10E6/uL (ref 3.77–5.28)
RDW: 14.8 % (ref 11.7–15.4)
WBC: 10.8 10*3/uL (ref 3.4–10.8)

## 2022-02-01 NOTE — Progress Notes (Signed)

## 2022-02-02 ENCOUNTER — Encounter: Payer: Self-pay | Admitting: *Deleted

## 2022-02-03 ENCOUNTER — Inpatient Hospital Stay (HOSPITAL_COMMUNITY)
Admission: AD | Admit: 2022-02-03 | Discharge: 2022-02-06 | DRG: 783 | Disposition: A | Discharge status: Home / Self Care | Payer: Managed Care, Other (non HMO) | Attending: Obstetrics and Gynecology | Admitting: Obstetrics and Gynecology

## 2022-02-03 ENCOUNTER — Encounter: Payer: Self-pay | Admitting: Family Medicine

## 2022-02-03 DIAGNOSIS — O99820 Streptococcus B carrier state complicating pregnancy: Secondary | ICD-10-CM | POA: Insufficient documentation

## 2022-02-03 DIAGNOSIS — Z98891 History of uterine scar from previous surgery: Secondary | ICD-10-CM

## 2022-02-03 DIAGNOSIS — Z8751 Personal history of pre-term labor: Secondary | ICD-10-CM

## 2022-02-03 DIAGNOSIS — O42913 Preterm premature rupture of membranes, unspecified as to length of time between rupture and onset of labor, third trimester: Secondary | ICD-10-CM | POA: Diagnosis not present

## 2022-02-03 DIAGNOSIS — O479 False labor, unspecified: Secondary | ICD-10-CM

## 2022-02-03 DIAGNOSIS — O329XX0 Maternal care for malpresentation of fetus, unspecified, not applicable or unspecified: Secondary | ICD-10-CM | POA: Diagnosis present

## 2022-02-03 DIAGNOSIS — O42019 Preterm premature rupture of membranes, onset of labor within 24 hours of rupture, unspecified trimester: Principal | ICD-10-CM

## 2022-02-03 DIAGNOSIS — O2412 Pre-existing diabetes mellitus, type 2, in childbirth: Secondary | ICD-10-CM | POA: Diagnosis present

## 2022-02-03 DIAGNOSIS — Z3A36 36 weeks gestation of pregnancy: Secondary | ICD-10-CM | POA: Diagnosis not present

## 2022-02-03 DIAGNOSIS — O99824 Streptococcus B carrier state complicating childbirth: Secondary | ICD-10-CM | POA: Diagnosis present

## 2022-02-03 DIAGNOSIS — O24113 Pre-existing diabetes mellitus, type 2, in pregnancy, third trimester: Secondary | ICD-10-CM | POA: Diagnosis present

## 2022-02-03 DIAGNOSIS — E1165 Type 2 diabetes mellitus with hyperglycemia: Secondary | ICD-10-CM | POA: Diagnosis present

## 2022-02-03 DIAGNOSIS — O365930 Maternal care for other known or suspected poor fetal growth, third trimester, not applicable or unspecified: Secondary | ICD-10-CM | POA: Diagnosis present

## 2022-02-03 DIAGNOSIS — O321XX0 Maternal care for breech presentation, not applicable or unspecified: Secondary | ICD-10-CM | POA: Diagnosis present

## 2022-02-03 DIAGNOSIS — O99214 Obesity complicating childbirth: Secondary | ICD-10-CM | POA: Diagnosis present

## 2022-02-03 DIAGNOSIS — Z7984 Long term (current) use of oral hypoglycemic drugs: Secondary | ICD-10-CM

## 2022-02-03 DIAGNOSIS — O42919 Preterm premature rupture of membranes, unspecified as to length of time between rupture and onset of labor, unspecified trimester: Secondary | ICD-10-CM | POA: Diagnosis present

## 2022-02-03 DIAGNOSIS — Z302 Encounter for sterilization: Secondary | ICD-10-CM

## 2022-02-03 LAB — CULTURE, BETA STREP (GROUP B ONLY): Strep Gp B Culture: POSITIVE — AB

## 2022-02-03 NOTE — H&P (Incomplete)
Julie Brooks is a 36 y.o. female presenting for Preterm Premature Rupture of Membranes "sometime today". and painful contractions.  Last ate at 9pm Known breech presentation, scheduled for version Pregnancy has been followed at [facility] and remarkable for: Patient Active Problem List   Diagnosis Date Noted   Preterm premature rupture of membranes (PPROM) with unknown onset of labor 02/04/2022   GBS (group B Streptococcus carrier), +RV culture, currently pregnant 02/03/2022   Malpresentation of fetus 01/16/2022   Poorly controlled diabetes mellitus (HCC) 09/02/2021   Pre-existing type 2 diabetes mellitus during pregnancy in third trimester 08/02/2021   Supervision of high risk pregnancy, antepartum 07/21/2021   AMA (advanced maternal age) multigravida 35+ 07/21/2021   Hypertriglyceridemia 11/25/2020   History of preterm delivery 02/02/2017   Uses Dexcom and Omnipod Last growth 34%ile Resolved low lying placenta . OB History     Gravida  5   Para  2   Term  0   Preterm  2   AB  2   Living  2      SAB  2   IAB  0   Ectopic  0   Multiple  0   Live Births  2          Past Medical History:  Diagnosis Date   Back pain    Hypertriglyceridemia 11/25/2020   09/29/2020   Placenta previa antepartum in third trimester 09/10/2021   10/05/21 at anatomy u/s.    Poorly controlled diabetes mellitus (HCC) 09/02/2021   A1c 10.3 at 8wks   Past Surgical History:  Procedure Laterality Date   APPENDECTOMY     DILATION AND CURETTAGE OF UTERUS  2004   Family History: family history includes Asthma in her daughter; Breast cancer in her paternal aunt; Diabetes in her brother, father, and mother; Hypertension in her mother; Prostate cancer in her paternal uncle. Social History:  reports that she has never smoked. She has never used smokeless tobacco. She reports that she does not currently use alcohol. She reports that she does not use drugs.     Maternal Diabetes: Yes:   Diabetes Type:  Pre-pregnancy, Insulin/Medication controlled   Dexcom and Omnipod Genetic Screening: Normal Maternal Ultrasounds/Referrals: IUGR but resolved to 34% Fetal Ultrasounds or other Referrals:  None Maternal Substance Abuse:  No Significant Maternal Medications:  Meds include: Other: Insulin Significant Maternal Lab Results:  Group B Strep negative Number of Prenatal Visits:greater than 3 verified prenatal visits Other Comments:  None  Review of Systems  Constitutional:  Negative for chills and fever.  Eyes:  Negative for visual disturbance.  Respiratory:  Negative for shortness of breath.   Gastrointestinal:  Negative for nausea and vomiting.  Genitourinary:  Positive for pelvic pain. Negative for vaginal bleeding.  Musculoskeletal:  Negative for myalgias.  Neurological:  Negative for weakness and headaches.   Maternal Medical History:  Reason for admission: Rupture of membranes and contractions.  Nausea.  Contractions: Onset was 6-12 hours ago.   Frequency: irregular.   Perceived severity is moderate.   Fetal activity: Perceived fetal activity is normal.   Last perceived fetal movement was within the past hour.   Prenatal complications: IUGR (resolved).   No bleeding, PIH or pre-eclampsia.   Prenatal Complications - Diabetes: type 2. Diabetes is managed by insulin pump.     Dilation: 4 Effacement (%): 80 Exam by:: Arlyss Gandy, RN. Last menstrual period 05/23/2021, unknown if currently breastfeeding. Maternal Exam:  Uterine Assessment: Contraction strength is moderate.  Contraction  frequency is irregular.  Abdomen: Patient reports no abdominal tenderness. Fetal presentation: breech Introitus: Normal vulva. Normal vagina.  Ferning test: positive.  Nitrazine test: not done. Amniotic fluid character: clear. Pelvis: adequate for delivery.   Cervix: Cervix evaluated by digital exam.     Fetal Exam Fetal Monitor Review: Mode: ultrasound.   Baseline rate: 140.   Variability: moderate (6-25 bpm).   Pattern: accelerations present and no decelerations.   Fetal State Assessment: Category I - tracings are normal.   Physical Exam Constitutional:      General: She is not in acute distress.    Appearance: She is not ill-appearing or toxic-appearing.  HENT:     Head: Normocephalic.  Cardiovascular:     Rate and Rhythm: Normal rate.  Pulmonary:     Effort: Pulmonary effort is normal.  Abdominal:     General: There is no distension.     Tenderness: There is no abdominal tenderness. There is no guarding.  Genitourinary:    General: Normal vulva.     Comments: Dilation: 4 Effacement (%): 80 Exam by:: Arlyss Gandy, RN.  Breech per [place], head to Maternal Left Musculoskeletal:        General: Normal range of motion.     Cervical back: Normal range of motion.  Skin:    General: Skin is warm and dry.  Neurological:     General: No focal deficit present.     Mental Status: She is alert.  Psychiatric:        Mood and Affect: Mood normal.    Pt informed that the ultrasound is considered a limited OB ultrasound and is not intended to be a complete ultrasound exam.  Patient also informed that the ultrasound is not being completed with the intent of assessing for fetal or placental anomalies or any pelvic abnormalities.  Explained that the purpose of today's ultrasound is to assess for presentation.  Patient acknowledges the purpose of the exam and the limitations of the study.    I supervised [place] by RN and also did my own scan.  Breech presentation, appears complete breech, head to maternal left. Longitudinal lie  Prenatal labs: ABO, Rh: O/Positive/-- (06/01 1430) Antibody: Negative (06/01 1430) Rubella: 2.04 (06/01 1430) RPR: Non Reactive (10/02 1442)  HBsAg: Negative (06/01 1430)  HIV: Non Reactive (10/02 1442)  GBS: Positive/-- (12/12 2952)   Assessment/Plan: S[place] IUP at [redacted]w[redacted]d PPROM Labor GBS Neg Breech Presentation  Admit to Labor and  Delivery MD to follow   Wynelle Bourgeois 02/03/2022, 11:55 PM

## 2022-02-04 ENCOUNTER — Inpatient Hospital Stay (HOSPITAL_COMMUNITY): Payer: Managed Care, Other (non HMO) | Admitting: Anesthesiology

## 2022-02-04 ENCOUNTER — Encounter (HOSPITAL_COMMUNITY): Payer: Self-pay | Admitting: Obstetrics and Gynecology

## 2022-02-04 ENCOUNTER — Encounter (HOSPITAL_COMMUNITY)
Admission: AD | Disposition: A | Discharge status: Home / Self Care | Payer: Self-pay | Source: Home / Self Care | Attending: Obstetrics and Gynecology

## 2022-02-04 DIAGNOSIS — O479 False labor, unspecified: Secondary | ICD-10-CM

## 2022-02-04 DIAGNOSIS — O2412 Pre-existing diabetes mellitus, type 2, in childbirth: Secondary | ICD-10-CM

## 2022-02-04 DIAGNOSIS — O42019 Preterm premature rupture of membranes, onset of labor within 24 hours of rupture, unspecified trimester: Secondary | ICD-10-CM

## 2022-02-04 DIAGNOSIS — O24429 Gestational diabetes mellitus in childbirth, unspecified control: Secondary | ICD-10-CM | POA: Diagnosis not present

## 2022-02-04 DIAGNOSIS — Z302 Encounter for sterilization: Secondary | ICD-10-CM

## 2022-02-04 DIAGNOSIS — O42919 Preterm premature rupture of membranes, unspecified as to length of time between rupture and onset of labor, unspecified trimester: Secondary | ICD-10-CM | POA: Diagnosis present

## 2022-02-04 DIAGNOSIS — Z3A36 36 weeks gestation of pregnancy: Secondary | ICD-10-CM

## 2022-02-04 DIAGNOSIS — O321XX0 Maternal care for breech presentation, not applicable or unspecified: Secondary | ICD-10-CM

## 2022-02-04 DIAGNOSIS — E1165 Type 2 diabetes mellitus with hyperglycemia: Secondary | ICD-10-CM | POA: Diagnosis present

## 2022-02-04 DIAGNOSIS — O329XX0 Maternal care for malpresentation of fetus, unspecified, not applicable or unspecified: Secondary | ICD-10-CM | POA: Diagnosis not present

## 2022-02-04 DIAGNOSIS — O164 Unspecified maternal hypertension, complicating childbirth: Secondary | ICD-10-CM | POA: Diagnosis not present

## 2022-02-04 DIAGNOSIS — Z7984 Long term (current) use of oral hypoglycemic drugs: Secondary | ICD-10-CM | POA: Diagnosis not present

## 2022-02-04 DIAGNOSIS — Z98891 History of uterine scar from previous surgery: Secondary | ICD-10-CM

## 2022-02-04 DIAGNOSIS — O42013 Preterm premature rupture of membranes, onset of labor within 24 hours of rupture, third trimester: Secondary | ICD-10-CM

## 2022-02-04 DIAGNOSIS — O99214 Obesity complicating childbirth: Secondary | ICD-10-CM | POA: Diagnosis present

## 2022-02-04 DIAGNOSIS — O99820 Streptococcus B carrier state complicating pregnancy: Secondary | ICD-10-CM

## 2022-02-04 DIAGNOSIS — O42913 Preterm premature rupture of membranes, unspecified as to length of time between rupture and onset of labor, third trimester: Secondary | ICD-10-CM | POA: Diagnosis present

## 2022-02-04 DIAGNOSIS — O99824 Streptococcus B carrier state complicating childbirth: Secondary | ICD-10-CM | POA: Diagnosis present

## 2022-02-04 DIAGNOSIS — O09523 Supervision of elderly multigravida, third trimester: Secondary | ICD-10-CM

## 2022-02-04 DIAGNOSIS — O365930 Maternal care for other known or suspected poor fetal growth, third trimester, not applicable or unspecified: Secondary | ICD-10-CM

## 2022-02-04 LAB — GLUCOSE, CAPILLARY
Glucose-Capillary: 103 mg/dL — ABNORMAL HIGH (ref 70–99)
Glucose-Capillary: 107 mg/dL — ABNORMAL HIGH (ref 70–99)
Glucose-Capillary: 108 mg/dL — ABNORMAL HIGH (ref 70–99)
Glucose-Capillary: 136 mg/dL — ABNORMAL HIGH (ref 70–99)
Glucose-Capillary: 147 mg/dL — ABNORMAL HIGH (ref 70–99)
Glucose-Capillary: 94 mg/dL (ref 70–99)

## 2022-02-04 LAB — CBC
HCT: 36.4 % (ref 36.0–46.0)
HCT: 30.6 % — ABNORMAL LOW (ref 36.0–46.0)
Hemoglobin: 12.1 g/dL (ref 12.0–15.0)
Hemoglobin: 10.1 g/dL — ABNORMAL LOW (ref 12.0–15.0)
MCH: 25.9 pg — ABNORMAL LOW (ref 26.0–34.0)
MCH: 25.5 pg — ABNORMAL LOW (ref 26.0–34.0)
MCHC: 33.2 g/dL (ref 30.0–36.0)
MCHC: 33 g/dL (ref 30.0–36.0)
MCV: 77.8 fL — ABNORMAL LOW (ref 80.0–100.0)
MCV: 77.3 fL — ABNORMAL LOW (ref 80.0–100.0)
Platelets: 272 10*3/uL (ref 150–400)
Platelets: 256 10*3/uL (ref 150–400)
RBC: 4.68 MIL/uL (ref 3.87–5.11)
RBC: 3.96 MIL/uL (ref 3.87–5.11)
RDW: 15.2 % (ref 11.5–15.5)
RDW: 15.1 % (ref 11.5–15.5)
WBC: 12.4 10*3/uL — ABNORMAL HIGH (ref 4.0–10.5)
WBC: 18.3 10*3/uL — ABNORMAL HIGH (ref 4.0–10.5)
nRBC: 0 % (ref 0.0–0.2)
nRBC: 0 % (ref 0.0–0.2)

## 2022-02-04 LAB — URINALYSIS, ROUTINE W REFLEX MICROSCOPIC
Bilirubin Urine: NEGATIVE
Glucose, UA: NEGATIVE mg/dL
Hgb urine dipstick: NEGATIVE
Ketones, ur: NEGATIVE mg/dL
Leukocytes,Ua: NEGATIVE
Nitrite: NEGATIVE
Protein, ur: NEGATIVE mg/dL
Specific Gravity, Urine: 1.004 — ABNORMAL LOW (ref 1.005–1.030)
pH: 7 (ref 5.0–8.0)

## 2022-02-04 LAB — TYPE AND SCREEN
ABO/RH(D): O POS
ABO/RH(D): O POS
Antibody Screen: NEGATIVE
Antibody Screen: NEGATIVE

## 2022-02-04 LAB — RPR: RPR Ser Ql: NONREACTIVE

## 2022-02-04 LAB — POCT FERN TEST: POCT Fern Test: POSITIVE

## 2022-02-04 SURGERY — Surgical Case
Anesthesia: Spinal

## 2022-02-04 MED ORDER — OXYCODONE HCL 5 MG PO TABS: 5.0000 mg | ORAL_TABLET | Freq: Once | ORAL | Status: DC | PRN

## 2022-02-04 MED ORDER — WITCH HAZEL-GLYCERIN EX PADS: 1.0000 | MEDICATED_PAD | CUTANEOUS | Status: DC | PRN

## 2022-02-04 MED ORDER — OXYCODONE HCL 5 MG PO TABS
5.0000 mg | ORAL_TABLET | ORAL | Status: DC | PRN
  Administered 2022-02-04: 10 mg via ORAL
  Administered 2022-02-05 – 2022-02-06 (×2): 5 mg via ORAL
  Filled 2022-02-04 (×2): qty 2

## 2022-02-04 MED ORDER — DIPHENHYDRAMINE HCL 25 MG PO CAPS: 25.0000 mg | ORAL_CAPSULE | Freq: Four times a day (QID) | ORAL | Status: DC | PRN

## 2022-02-04 MED ORDER — SODIUM CHLORIDE 0.9 % IR SOLN
Status: DC | PRN
  Administered 2022-02-04: 1

## 2022-02-04 MED ORDER — OXYCODONE HCL 5 MG/5ML PO SOLN: 5.0000 mg | Freq: Once | ORAL | Status: DC | PRN

## 2022-02-04 MED ORDER — OXYTOCIN-SODIUM CHLORIDE 30-0.9 UT/500ML-% IV SOLN
INTRAVENOUS | Status: DC | PRN
  Administered 2022-02-04: 30 [IU] via INTRAVENOUS

## 2022-02-04 MED ORDER — MENTHOL 3 MG MT LOZG: 1.0000 | LOZENGE | OROMUCOSAL | Status: DC | PRN

## 2022-02-04 MED ORDER — INSULIN ASPART 100 UNIT/ML IJ SOLN: 0.0000 [IU] | INTRAMUSCULAR | Status: DC

## 2022-02-04 MED ORDER — ZOLPIDEM TARTRATE 5 MG PO TABS: 5.0000 mg | ORAL_TABLET | Freq: Every evening | ORAL | Status: DC | PRN

## 2022-02-04 MED ORDER — INSULIN ASPART 100 UNIT/ML IJ SOLN
0.0000 [IU] | Freq: Three times a day (TID) | INTRAMUSCULAR | Status: DC
  Administered 2022-02-05: 3 [IU] via SUBCUTANEOUS

## 2022-02-04 MED ORDER — LACTATED RINGERS IV SOLN: INTRAVENOUS | Status: DC

## 2022-02-04 MED ORDER — IBUPROFEN 600 MG PO TABS
600.0000 mg | ORAL_TABLET | Freq: Four times a day (QID) | ORAL | Status: DC
  Administered 2022-02-05 – 2022-02-06 (×6): 600 mg via ORAL
  Filled 2022-02-04 (×6): qty 1

## 2022-02-04 MED ORDER — DIBUCAINE (PERIANAL) 1 % EX OINT: 1.0000 | TOPICAL_OINTMENT | CUTANEOUS | Status: DC | PRN

## 2022-02-04 MED ORDER — SODIUM CHLORIDE 0.9 % IV SOLN: INTRAVENOUS | Status: DC | PRN

## 2022-02-04 MED ORDER — SIMETHICONE 80 MG PO CHEW
80.0000 mg | CHEWABLE_TABLET | Freq: Three times a day (TID) | ORAL | Status: DC
  Administered 2022-02-04 – 2022-02-06 (×9): 80 mg via ORAL
  Filled 2022-02-04 (×10): qty 1

## 2022-02-04 MED ORDER — SIMETHICONE 80 MG PO CHEW: 80.0000 mg | CHEWABLE_TABLET | ORAL | Status: DC | PRN

## 2022-02-04 MED ORDER — CEFAZOLIN SODIUM-DEXTROSE 2-4 GM/100ML-% IV SOLN
2.0000 g | INTRAVENOUS | Status: DC
  Filled 2022-02-04: qty 100

## 2022-02-04 MED ORDER — ERYTHROMYCIN 5 MG/GM OP OINT
TOPICAL_OINTMENT | OPHTHALMIC | Status: AC
  Filled 2022-02-04: qty 1

## 2022-02-04 MED ORDER — MORPHINE SULFATE (PF) 0.5 MG/ML IJ SOLN
INTRAMUSCULAR | Status: DC | PRN
  Administered 2022-02-04: 150 ug via INTRATHECAL

## 2022-02-04 MED ORDER — ACETAMINOPHEN 10 MG/ML IV SOLN
INTRAVENOUS | Status: DC | PRN
  Administered 2022-02-04: 1000 mg via INTRAVENOUS

## 2022-02-04 MED ORDER — TERBUTALINE SULFATE 1 MG/ML IJ SOLN
0.2500 mg | Freq: Once | INTRAMUSCULAR | Status: AC
  Administered 2022-02-04: 0.25 mg via SUBCUTANEOUS

## 2022-02-04 MED ORDER — COCONUT OIL OIL: 1.0000 | TOPICAL_OIL | Status: DC | PRN

## 2022-02-04 MED ORDER — SCOPOLAMINE 1 MG/3DAYS TD PT72
MEDICATED_PATCH | TRANSDERMAL | Status: DC | PRN
  Administered 2022-02-04: 1 via TRANSDERMAL

## 2022-02-04 MED ORDER — FENTANYL CITRATE (PF) 100 MCG/2ML IJ SOLN
INTRAMUSCULAR | Status: AC
  Filled 2022-02-04: qty 2

## 2022-02-04 MED ORDER — STERILE WATER FOR IRRIGATION IR SOLN
Status: DC | PRN
  Administered 2022-02-04: 1

## 2022-02-04 MED ORDER — FENTANYL CITRATE (PF) 100 MCG/2ML IJ SOLN
INTRAMUSCULAR | Status: DC | PRN
  Administered 2022-02-04: 15 ug via INTRATHECAL

## 2022-02-04 MED ORDER — TETANUS-DIPHTH-ACELL PERTUSSIS 5-2.5-18.5 LF-MCG/0.5 IM SUSY: 0.5000 mL | PREFILLED_SYRINGE | Freq: Once | INTRAMUSCULAR | Status: DC

## 2022-02-04 MED ORDER — KETOROLAC TROMETHAMINE 30 MG/ML IJ SOLN
30.0000 mg | Freq: Four times a day (QID) | INTRAMUSCULAR | Status: AC
  Administered 2022-02-04 (×4): 30 mg via INTRAVENOUS
  Filled 2022-02-04 (×4): qty 1

## 2022-02-04 MED ORDER — PHENYLEPHRINE HCL-NACL 20-0.9 MG/250ML-% IV SOLN
INTRAVENOUS | Status: DC | PRN
  Administered 2022-02-04: 60 ug/min via INTRAVENOUS

## 2022-02-04 MED ORDER — SOD CITRATE-CITRIC ACID 500-334 MG/5ML PO SOLN
30.0000 mL | Freq: Once | ORAL | Status: AC
  Administered 2022-02-04: 30 mL via ORAL
  Filled 2022-02-04: qty 30

## 2022-02-04 MED ORDER — SOD CITRATE-CITRIC ACID 500-334 MG/5ML PO SOLN: 30.0000 mL | ORAL | Status: DC

## 2022-02-04 MED ORDER — CEFAZOLIN SODIUM-DEXTROSE 2-3 GM-%(50ML) IV SOLR
INTRAVENOUS | Status: DC | PRN
  Administered 2022-02-04: 2 g via INTRAVENOUS

## 2022-02-04 MED ORDER — BUPIVACAINE IN DEXTROSE 0.75-8.25 % IT SOLN
INTRATHECAL | Status: DC | PRN
  Administered 2022-02-04: 1.55 mL via INTRATHECAL

## 2022-02-04 MED ORDER — ONDANSETRON HCL 4 MG/2ML IJ SOLN
INTRAMUSCULAR | Status: AC
  Filled 2022-02-04: qty 2

## 2022-02-04 MED ORDER — ONDANSETRON HCL 4 MG/2ML IJ SOLN
INTRAMUSCULAR | Status: DC | PRN
  Administered 2022-02-04: 4 mg via INTRAVENOUS

## 2022-02-04 MED ORDER — PRENATAL MULTIVITAMIN CH
1.0000 | ORAL_TABLET | Freq: Every day | ORAL | Status: DC
  Administered 2022-02-05 – 2022-02-06 (×2): 1 via ORAL
  Filled 2022-02-04 (×3): qty 1

## 2022-02-04 MED ORDER — GABAPENTIN 100 MG PO CAPS
200.0000 mg | ORAL_CAPSULE | Freq: Every day | ORAL | Status: DC
  Administered 2022-02-04 – 2022-02-05 (×2): 200 mg via ORAL
  Filled 2022-02-04 (×2): qty 2

## 2022-02-04 MED ORDER — ACETAMINOPHEN 325 MG PO TABS: 325.0000 mg | ORAL_TABLET | ORAL | Status: DC | PRN

## 2022-02-04 MED ORDER — CEFAZOLIN SODIUM-DEXTROSE 2-4 GM/100ML-% IV SOLN
INTRAVENOUS | Status: AC
  Filled 2022-02-04: qty 100

## 2022-02-04 MED ORDER — FAMOTIDINE IN NACL 20-0.9 MG/50ML-% IV SOLN
20.0000 mg | Freq: Once | INTRAVENOUS | Status: AC
  Administered 2022-02-04: 20 mg via INTRAVENOUS
  Filled 2022-02-04: qty 50

## 2022-02-04 MED ORDER — SENNOSIDES-DOCUSATE SODIUM 8.6-50 MG PO TABS
2.0000 | ORAL_TABLET | Freq: Every day | ORAL | Status: DC
  Administered 2022-02-05 – 2022-02-06 (×2): 2 via ORAL
  Filled 2022-02-04 (×2): qty 2

## 2022-02-04 MED ORDER — ONDANSETRON HCL 4 MG/2ML IJ SOLN: 4.0000 mg | Freq: Once | INTRAMUSCULAR | Status: DC | PRN

## 2022-02-04 MED ORDER — FENTANYL CITRATE (PF) 100 MCG/2ML IJ SOLN: 25.0000 ug | INTRAMUSCULAR | Status: DC | PRN

## 2022-02-04 MED ORDER — SCOPOLAMINE 1 MG/3DAYS TD PT72
MEDICATED_PATCH | TRANSDERMAL | Status: AC
  Filled 2022-02-04: qty 1

## 2022-02-04 MED ORDER — OXYTOCIN-SODIUM CHLORIDE 30-0.9 UT/500ML-% IV SOLN
2.5000 [IU]/h | INTRAVENOUS | Status: AC
  Administered 2022-02-04: 2.5 [IU]/h via INTRAVENOUS
  Filled 2022-02-04: qty 500

## 2022-02-04 MED ORDER — ENOXAPARIN SODIUM 40 MG/0.4ML IJ SOSY
40.0000 mg | PREFILLED_SYRINGE | INTRAMUSCULAR | Status: DC
  Administered 2022-02-04 – 2022-02-05 (×2): 40 mg via SUBCUTANEOUS
  Filled 2022-02-04 (×2): qty 0.4

## 2022-02-04 MED ORDER — MORPHINE SULFATE (PF) 0.5 MG/ML IJ SOLN
INTRAMUSCULAR | Status: AC
  Filled 2022-02-04: qty 10

## 2022-02-04 MED ORDER — ACETAMINOPHEN 10 MG/ML IV SOLN
INTRAVENOUS | Status: AC
  Filled 2022-02-04: qty 100

## 2022-02-04 MED ORDER — ACETAMINOPHEN 160 MG/5ML PO SOLN: 325.0000 mg | ORAL | Status: DC | PRN

## 2022-02-04 MED ORDER — METFORMIN HCL 500 MG PO TABS
1000.0000 mg | ORAL_TABLET | Freq: Two times a day (BID) | ORAL | Status: DC
  Administered 2022-02-04 – 2022-02-06 (×6): 1000 mg via ORAL
  Filled 2022-02-04 (×6): qty 2

## 2022-02-04 MED ORDER — MEPERIDINE HCL 25 MG/ML IJ SOLN: 6.2500 mg | INTRAMUSCULAR | Status: DC | PRN

## 2022-02-04 MED ORDER — ACETAMINOPHEN 500 MG PO TABS
1000.0000 mg | ORAL_TABLET | Freq: Four times a day (QID) | ORAL | Status: DC
  Administered 2022-02-04 – 2022-02-06 (×9): 1000 mg via ORAL
  Filled 2022-02-04 (×9): qty 2

## 2022-02-04 MED ORDER — OXYTOCIN-SODIUM CHLORIDE 30-0.9 UT/500ML-% IV SOLN
INTRAVENOUS | Status: AC
  Filled 2022-02-04: qty 500

## 2022-02-04 MED ORDER — LACTATED RINGERS IV BOLUS
1000.0000 mL | Freq: Once | INTRAVENOUS | Status: AC
  Administered 2022-02-04: 1000 mL via INTRAVENOUS

## 2022-02-04 MED ORDER — TERBUTALINE SULFATE 1 MG/ML IJ SOLN
INTRAMUSCULAR | Status: AC
  Filled 2022-02-04: qty 1

## 2022-02-04 SURGICAL SUPPLY — 41 items
BENZOIN TINCTURE PRP APPL 2/3 (GAUZE/BANDAGES/DRESSINGS) IMPLANT
CANISTER PREVENA PLUS 150 (CANNISTER) IMPLANT
CHLORAPREP W/TINT 26 (MISCELLANEOUS) ×2 IMPLANT
CLAMP UMBILICAL CORD (MISCELLANEOUS) ×1 IMPLANT
CLOTH BEACON ORANGE TIMEOUT ST (SAFETY) ×1 IMPLANT
DRESSING PREVENA PLUS CUSTOM (GAUZE/BANDAGES/DRESSINGS) IMPLANT
DRSG OPSITE POSTOP 4X10 (GAUZE/BANDAGES/DRESSINGS) ×1 IMPLANT
DRSG PREVENA PLUS CUSTOM (GAUZE/BANDAGES/DRESSINGS) ×1
ELECT REM PT RETURN 9FT ADLT (ELECTROSURGICAL) ×1
ELECTRODE REM PT RTRN 9FT ADLT (ELECTROSURGICAL) ×1 IMPLANT
EXTRACTOR VACUUM BELL STYLE (SUCTIONS) IMPLANT
GAUZE SPONGE 4X4 12PLY STRL LF (GAUZE/BANDAGES/DRESSINGS) IMPLANT
GLOVE BIOGEL PI IND STRL 7.0 (GLOVE) ×2 IMPLANT
GLOVE ECLIPSE 7.0 STRL STRAW (GLOVE) ×1 IMPLANT
GOWN STRL REUS W/TWL LRG LVL3 (GOWN DISPOSABLE) ×2 IMPLANT
KIT ABG SYR 3ML LUER SLIP (SYRINGE) ×1 IMPLANT
MAT PREVALON FULL STRYKER (MISCELLANEOUS) IMPLANT
NDL HYPO 25X5/8 SAFETYGLIDE (NEEDLE) ×1 IMPLANT
NEEDLE HYPO 25X5/8 SAFETYGLIDE (NEEDLE) ×1 IMPLANT
NS IRRIG 1000ML POUR BTL (IV SOLUTION) ×1 IMPLANT
PACK C SECTION WH (CUSTOM PROCEDURE TRAY) ×1 IMPLANT
PAD ABD 7.5X8 STRL (GAUZE/BANDAGES/DRESSINGS) IMPLANT
PAD OB MATERNITY 4.3X12.25 (PERSONAL CARE ITEMS) ×1 IMPLANT
RETRACTOR TRAXI PANNICULUS (MISCELLANEOUS) IMPLANT
RTRCTR C-SECT PINK 25CM LRG (MISCELLANEOUS) ×1 IMPLANT
STRIP CLOSURE SKIN 1/2X4 (GAUZE/BANDAGES/DRESSINGS) IMPLANT
SUT MNCRL 0 VIOLET CTX 36 (SUTURE) ×2 IMPLANT
SUT MONOCRYL 0 CTX 36 (SUTURE) ×2
SUT PLAIN 0 NONE (SUTURE) IMPLANT
SUT PLAIN 2 0 (SUTURE)
SUT PLAIN 2 0 XLH (SUTURE) IMPLANT
SUT PLAIN ABS 2-0 CT1 27XMFL (SUTURE) IMPLANT
SUT VIC AB 0 CT1 36 (SUTURE) IMPLANT
SUT VIC AB 0 CTX 36 (SUTURE) ×1
SUT VIC AB 0 CTX36XBRD ANBCTRL (SUTURE) ×1 IMPLANT
SUT VIC AB 2-0 CT1 27 (SUTURE)
SUT VIC AB 2-0 CT1 TAPERPNT 27 (SUTURE) IMPLANT
SUT VIC AB 4-0 KS 27 (SUTURE) ×1 IMPLANT
TOWEL OR 17X24 6PK STRL BLUE (TOWEL DISPOSABLE) ×1 IMPLANT
TRAY FOLEY W/BAG SLVR 14FR LF (SET/KITS/TRAYS/PACK) IMPLANT
WATER STERILE IRR 1000ML POUR (IV SOLUTION) ×1 IMPLANT

## 2022-02-04 NOTE — Discharge Summary (Signed)
Postpartum Discharge Summary  Date of Service updated***     Patient Name: Julie Brooks DOB: 08/11/1985 MRN: [id number]  Date of admission: 02/03/2022 Delivery date:02/04/2022  Delivering provider: CONSTANT, Abrams  Date of discharge: 02/04/2022  Admitting diagnosis: Preterm premature rupture of membranes (PPROM) with unknown onset of labor [O42.919] Intrauterine pregnancy: [redacted]w[redacted]d    Secondary diagnosis:  Principal Problem:   Preterm premature rupture of membranes (PPROM) with unknown onset of labor Active Problems:   History of preterm delivery   Pre-existing type 2 diabetes mellitus during pregnancy in third trimester   Poorly controlled diabetes mellitus (H[place]   Malpresentation of fetus   Status post primary low transverse cesarean section   Preterm delivery, delivered  Additional problems: ***    Discharge diagnosis: {DX.:23714}                                              Post partum procedures:{Postpartum procedures:23558} Augmentation: N/A Complications: None  Hospital course: Sceduled C/S   36y.o. yo [id number] admitted to the hospital 02/03/2022 for preterm PROM and noted to be in active labor. She was scheduled for a C-section immediately with the following indication:Malpresentation - breech. Delivery details are as follows:  Membrane Rupture Time/Date:  ,02/03/2022   Delivery Method:C-Section, Low Transverse  Details of operation can be found in separate operative note.  Patient had a postpartum course complicated by***.  She is ambulating, tolerating a regular diet, passing flatus, and urinating well. Patient is discharged home in stable condition on  02/04/22        Newborn Data: Birth date:02/04/2022  Birth time:1:57 AM  Gender:Female  Living status:Living  Apgars:8 ,9  Weight:3204 g     Magnesium Sulfate received: No BMZ received: No Rhophylac:N/A MMR:{MMR:30440033} T-DaP:{Tdap:23962} Flu: {F{FHQ:19758}ransfusion:{Transfusion  received:30440034}  Physical exam  Vitals:   02/04/22 0325 02/04/22 0330 02/04/22 0340 02/04/22 0345  BP:  130/84    Pulse: 95 (!) 106 (!) 107 (!) 106  Resp: 15 (!) 23 (!) 33 19  Temp:      TempSrc:      SpO2: 99% 98% 98% 99%  Weight:      Height:       General: {Exam; general:21111117} Lochia: {Desc; appropriate/inappropriate:30686::"appropriate"} Uterine Fundus: {Desc; firm/soft:30687} Incision: {Exam; incision:21111123} DVT Evaluation: {Exam; dvt:2111122} Labs: Lab Results  Component Value Date   WBC 12.4 (H) 02/03/2022   HGB 12.1 02/03/2022   HCT 36.4 02/03/2022   MCV 77.8 (L) 02/03/2022   PLT 272 02/03/2022      Latest Ref Rng & Units 01/31/2022   11:15 AM  CMP  Glucose 70 - 99 mg/dL 161   BUN 6 - 20 mg/dL 7   Creatinine 0.57 - 1.00 mg/dL 0.57   Sodium 134 - 144 mmol/L 139   Potassium 3.5 - 5.2 mmol/L 4.2   Chloride 96 - 106 mmol/L 103   CO2 20 - 29 mmol/L 19   Calcium 8.7 - 10.2 mg/dL 9.5   Total Protein 6.0 - 8.5 g/dL 6.5   Total Bilirubin 0.0 - 1.2 mg/dL <0.2   Alkaline Phos 44 - 121 IU/L 148   AST 0 - 40 IU/L 11   ALT 0 - 32 IU/L 7    Edinburgh Score:    08/09/2017    9:47 AM  Edinburgh Postnatal Depression Scale Screening  Tool  I have been able to laugh and see the funny side of things. 0  I have looked forward with enjoyment to things. 0  I have blamed myself unnecessarily when things went wrong. 0  I have been anxious or worried for no good reason. 0  I have felt scared or panicky for no good reason. 0  Things have been getting on top of me. 0  I have been so unhappy that I have had difficulty sleeping. 0  I have felt sad or miserable. 1  I have been so unhappy that I have been crying. 0  The thought of harming myself has occurred to me. 0  Edinburgh Postnatal Depression Scale Total 1     After visit meds:  Allergies as of 02/04/2022       Reactions   Azithromycin Swelling   Possible reaction to current medication   Fioricet  [butalbital-apap-caffeine] Swelling   Possible reaction to current medication     Med Rec must be completed prior to using this [facility]***        Discharge home in stable condition Infant Feeding: {Baby feeding:23562} Infant Disposition:{CHL IP OB HOME WITH [facility]:37543} Discharge instruction: per After Visit Summary and Postpartum booklet. Activity: Advance as tolerated. Pelvic rest for 6 weeks.  Diet: {OB KGOV:70340352} Future Appointments: Future Appointments  Date Time Provider [place]  02/06/2022  8:00 AM [facility] SCHED ROOM [facility]IN[facility] None   Follow up Visit:  The following message was sent to [facility] by Mikki Santee, MD  Please schedule this patient for a In person postpartum visit in 6 weeks with the following provider: MD. Additional Postpartum F/U: diabetes educator to transition care due to uncontrolled T2DM in 1st trimester   High risk pregnancy complicated YE:LYHTMB controlled T2DM, history of preterm delivery Delivery mode:  C-Section, Low Transverse  Anticipated Birth Control: BTL done intra-op.   02/04/2022 Corinna Burkman Sherrilyn Rist, MD

## 2022-02-04 NOTE — Lactation Note (Signed)
This note was copied from a baby's chart. Lactation Consultation Note  Patient Name: Julie Brooks KGURK'Y Date: 02/04/2022 Reason for consult: Follow-up assessment;Mother's request;Difficult latch;Late-preterm 34-36.6wks;Breastfeeding assistance;Maternal endocrine disorder Age:38 hours  iPad interpreter Lucila [phone] used   LC entered the room and the birth parent was attempting to breastfeed the infant.  The infant had an episode of emesis and was not interested in latching to the breast.  The birth parent stated that she pumped 2 hours ago.  LC encouraged the birth parent to pump in 1 hour.  The birth parent said that she did not get much when pumping.  LC spoke with the birth parent about supplementing the infant.  The birth parent stated that she would like to use donor breast milk.  LC assisted the birth parent by giving her the donor milk form.  The form was given to the RN who started the process of getting donor milk ordered for the infant.  The birth parent will continue to work on the latch and supplement the infant according to supplementation guidelines after putting the infant to the breast.   Feeding Mother's Current Feeding Choice: Breast Milk and Donor Milk   Interventions Interventions: Assisted with latch  Discharge    Consult Status Consult Status: Follow-up Date: 02/05/22 Follow-up type: In-patient    Orvil Feil Lineth Thielke 02/04/2022, 2:13 PM

## 2022-02-04 NOTE — Anesthesia Procedure Notes (Signed)
Spinal  Patient location during procedure: OR Start time: 02/04/2022 1:33 AM End time: 02/04/2022 1:39 AM Reason for block: surgical anesthesia Staffing Anesthesiologist: Bethena Midget, MD Performed by: Bethena Midget, MD Authorized by: Bethena Midget, MD   Preanesthetic Checklist Completed: patient identified, IV checked, site marked, risks and benefits discussed, surgical consent, monitors and equipment checked, pre-op evaluation and timeout performed Spinal Block Patient position: sitting Prep: DuraPrep Patient monitoring: heart rate, cardiac monitor, continuous pulse ox and blood pressure Approach: midline Location: L3-4 Injection technique: single-shot Needle Needle type: Sprotte  Needle gauge: 24 G Needle length: 9 cm Assessment Sensory level: T4 Events: CSF return

## 2022-02-04 NOTE — Lactation Note (Signed)
This note was copied from a baby's chart. Lactation Consultation Note  Patient Name: Julie Brooks IDPOE'U Date: 02/04/2022 Reason for consult: Initial assessment;Maternal endocrine disorder;Breastfeeding assistance;Late-preterm 34-36.6wks;Difficult latch Age:38 hours  In-house interpreter used for part of the consult and Julie Brooks used for the remainder of the consult.  LC entered the room and the infant was laying next to the birth parent.  The infant began to show feeding cues.  LC assisted the birth parent with trying to put the infant to the breast in the football position.  The infant took a few sucks before falling asleep.  LC assisted the birth parent with hand expressing 63mL and spoon feeding it to the infant.  There was some areolar edema on the base of the right nipple.  See below for more information about the birth parent's breast tissue.  LC did some reverse pressure softening to soften the tissue.  LC attempted to help the birth parent to latch using a size #20 nipple shield.  The infant took 10 sucks before falling asleep.  LC set the birth parent up with a DEBP per her request due to having a low milk supply with her previous child.  The the parent used the size #24 flange and stated that it was comfortable.  The birth parent stated that she did not have a breast pump at home.  A referral was sent to [facility] pump.  The birth parent was given the outpatient services brochure.  LC reviewed washing pump parts, pumping frequency, and milk storage guidelines.  The birth parent is aware that if the infant continues to be sleepy, she will need to supplement with DBM or formula.  The birth parent will call LC at the next feeding.   Infant Feeding Plan:  Breastfeed 8+ times in 24 hours according to feeding cues. Hand express and spoon feed expressed breast milk to the infant.  Pump every 3 hours and feed expressed breast milk to the infant via a bottle.   Supplement if the infant does not breastfeed according to supplementation guidelines. Call RN/LC for assistance with breastfeeding.   Maternal Data Has patient been taught Hand Expression?: Yes Does the patient have breastfeeding experience prior to this delivery?: Yes How long did the patient breastfeed?: Two weeks of pumping  Feeding Mother's Current Feeding Choice: Breast Milk and Formula  LATCH Score Latch: Repeated attempts needed to sustain latch, nipple held in mouth throughout feeding, stimulation needed to elicit sucking reflex.  Audible Swallowing: A few with stimulation  Type of Nipple: Flat (Nipples are short shafted; left more compressible than the right)  Comfort (Breast/Nipple): Soft / non-tender  Hold (Positioning): Assistance needed to correctly position infant at breast and maintain latch.  LATCH Score: 6   Lactation Tools Discussed/Used Tools: Pump Breast pump type: Double-Electric Breast Pump Pump Education: Setup, frequency, and cleaning Reason for Pumping: The birth parent's request; history of low milk supply Pumping frequency: After every other feeding and if the infant does not feed  Interventions Interventions: Breast feeding basics reviewed;Assisted with latch;Hand express;Breast massage;Reverse pressure;Breast compression;Adjust position;Support pillows;Expressed milk;DEBP;Education;LC Services brochure  Discharge Pump:  (No pump at home)  Consult Status Consult Status: Follow-up Date: 02/05/22 Follow-up type: In-patient    Julie Brooks 02/04/2022, 9:11 AM

## 2022-02-04 NOTE — Op Note (Signed)
Julie Brooks PROCEDURE DATE: 02/03/2022 - 02/04/2022  PREOPERATIVE DIAGNOSIS: Intrauterine pregnancy at  [redacted]w[redacted]d weeks gestation; malpresentation: frank breech in labor with PROM and desire for permanent sterilization  POSTOPERATIVE DIAGNOSIS: The same  PROCEDURE:     Cesarean Section and bilateral salpingectomy  SURGEON:  Dr. Catalina Antigua  ASSISTANT: Dr. Ladon Applebaum  An experienced assistant was required given the standard of surgical care given the complexity of the case.  This assistant was needed for exposure, dissection, suctioning, retraction, instrument exchange, assisting with delivery with administration of fundal pressure, and for overall help during the procedure.   INDICATIONS: Julie Brooks is a 36 y.o. [id number] at [redacted]w[redacted]d scheduled for cesarean section secondary to malpresentation: frank breech in labor .  The risks of cesarean section discussed with the patient included but were not limited to: bleeding which may require transfusion or reoperation; infection which may require antibiotics; injury to bowel, bladder, ureters or other surrounding organs; injury to the fetus; need for additional procedures including hysterectomy in the event of a life-threatening hemorrhage; placental abnormalities wth subsequent pregnancies, incisional problems, thromboembolic phenomenon and other postoperative/anesthesia complications. Patient also desires permanent sterilization. Risks and benefits of procedure discussed with patient including permanence of method, bleeding, infection, injury to surrounding organs and need for additional procedures. Risk failure of 0.5-1% with increased risk of ectopic gestation if pregnancy occurs was also discussed with patient. The patient concurred with the proposed plan, giving informed written consent for the procedure.    FINDINGS:  Viable female infant in frank breech presentation with nuchal cord x 1.  Apgars 9 and 9, weight, 7 pounds and 1 ounces.  Clear  amniotic fluid.  Intact placenta, three vessel cord.  Normal uterus, fallopian tubes and ovaries bilaterally.  ANESTHESIA:    Spinal INTRAVENOUS FLUIDS:2000 ml ESTIMATED BLOOD LOSS: 595 ml URINE OUTPUT:  100 ml SPECIMENS: Placenta sent to L&D and portions of left and right fallopian tubes sent to pathology COMPLICATIONS: None immediate  PROCEDURE IN DETAIL:  The patient received intravenous antibiotics and had sequential compression devices applied to her lower extremities while in the preoperative area.  She was then taken to the operating room where anesthesia was induced and was found to be adequate. A foley catheter was placed into her bladder and attached to Julie Brooks gravity. She was then placed in a dorsal supine position with a leftward tilt, and prepped and draped in a sterile manner. After an adequate timeout was performed, a Pfannenstiel skin incision was made with scalpel and carried through to the underlying layer of fascia. The fascia was incised in the midline and this incision was extended bluntly. The rectus muscles were separated in the midline bluntly and the peritoneum was entered bluntly. The Alexis self-retaining retractor was introduced into the abdominal cavity. Attention was turned to the lower uterine segment where a transverse hysterotomy was made with a scalpel and extended bilaterally bluntly. The infant was successfully delivered, and cord was clamped and cut following 1 minute of delayed cord clamping. The infant was handed over to awaiting neonatology team. Uterine massage was then administered and the placenta delivered intact with three-vessel cord. The uterus was cleared of clot and debris.  The hysterotomy was closed with 0 Vicryl in a running locked fashion, and an imbricating layer was also placed with a 0 Vicryl. Overall, excellent hemostasis was noted. The pelvis was cleared of all clot and debris. The patient's left fallopian tube was then identified, brought to the  incision, and grasped with  a Babcock clamp. The tube was then followed out to the fimbria. The Babcock clamp was then used to grasp the tube. A segment of the tube incorporating the fimbriated end was grasped with kelly clamp, transected and suture ligated. Good hemostasis was noted and the tube was returned to the abdomen. A similar process was performed on the right fallopian tube, allowing for bilateral salpingectomy. Hemostasis was confirmed on all surfaces.  The peritoneum was reapproximated using 0 vicryl interrupted stitches. The fascia was then closed using 0 Vicryl in a running fashion.  The subcutaneous layer was reapproximated with plain gut and the skin was closed in a subcuticular fashion using 3.0 Vicryl. Prevena wound vac was applied over the incision. The patient tolerated the procedure well. Sponge, lap, instrument and needle counts were correct x 2. She was taken to the recovery room in stable condition.    Julie Blume ConstantMD  02/04/2022 2:28 AM

## 2022-02-04 NOTE — MAU Note (Signed)
.  Julie Brooks is a 36 y.o. at [redacted]w[redacted]d here in MAU reporting: Water broke 02/03/22 but unsure of time but it was clear. Pt states that baby is complete breech but has an appointment on Monday to "turn baby". Pt states that she still wants to have a vag delivery. Pt reports some bloody show and +FM. Pt was 3cm on Tuesday.    Pain score: None Vitals:   02/04/22 0046 02/04/22 0051  BP: (!) 140/74   Pulse: (!) 121   Resp:    Temp:    SpO2: 98% 97%     FHT:147 Lab orders placed from triage:  labor eval/ Fern slide.

## 2022-02-04 NOTE — Anesthesia Preprocedure Evaluation (Signed)
Anesthesia Evaluation  Patient identified by MRN, date of birth, ID band Patient awake    Reviewed: Allergy & Precautions, NPO status , Patient's Chart, lab work & pertinent test results  Airway Mallampati: II  TM Distance: >3 FB Neck ROM: Full    Dental no notable dental hx. (+) Teeth Intact, Dental Advisory Given   Pulmonary neg pulmonary ROS,    Pulmonary exam normal breath sounds clear to auscultation       Cardiovascular Exercise Tolerance: Good hypertension, Pt. on medications negative cardio ROS Normal cardiovascular exam Rhythm:Regular Rate:Normal     Neuro/Psych negative neurological ROS  negative psych ROS   GI/Hepatic negative GI ROS, Neg liver ROS,   Endo/Other  diabetes, GestationalMorbid obesity  Renal/GU negative Renal ROS     Musculoskeletal negative musculoskeletal ROS (+)   Abdominal (+) + obese,   Peds negative pediatric ROS (+)  Hematology negative hematology ROS (+)   Anesthesia Other Findings   Reproductive/Obstetrics (+) Pregnancy                             Lab Results  Component Value Date   WBC 12.4 (H) 02/03/2022   HGB 12.1 02/03/2022   HCT 36.4 02/03/2022   MCV 77.8 (L) 02/03/2022   PLT 272 02/03/2022     Anesthesia Physical  Anesthesia Plan  ASA: 3 and emergent  Anesthesia Plan: Spinal   Post-op Pain Management: Minimal or no pain anticipated   Induction:   PONV Risk Score and Plan: 2 and Treatment may vary due to age or medical condition  Airway Management Planned: Natural Airway  Additional Equipment: None  Intra-op Plan:   Post-operative Plan:   Informed Consent: I have reviewed the patients History and Physical, chart, labs and discussed the procedure including the risks, benefits and alternatives for the proposed anesthesia with the patient or authorized representative who has indicated his/her understanding and acceptance.        Plan Discussed with: Anesthesiologist and CRNA  Anesthesia Plan Comments: (  )        Anesthesia Quick Evaluation

## 2022-02-04 NOTE — Transfer of Care (Signed)
Immediate Anesthesia Transfer of Care Note  Patient: Julie Brooks  Procedure(s) Performed: CESAREAN SECTION  Patient Location: PACU  Anesthesia Type:Spinal  Level of Consciousness: awake, alert , and oriented  Airway & Oxygen Therapy: Patient Spontanous Breathing  Post-op Assessment: Report given to RN and Post -op Vital signs reviewed and stable  Post vital signs: Reviewed and stable  Last Vitals:  Vitals Value Taken Time  BP 107/82 02/04/22 0310  Temp    Pulse 107 02/04/22 0312  Resp 16 02/04/22 0312  SpO2 100 % 02/04/22 0312  Vitals shown include unvalidated device data.  Last Pain:  Vitals:   02/04/22 0003  TempSrc: Oral         Complications: No notable events documented.

## 2022-02-04 NOTE — MAU Note (Signed)
Pt informed that the ultrasound is considered a limited OB ultrasound and is not intended to be a complete ultrasound exam.  Patient also informed that the ultrasound is not being completed with the intent of assessing for fetal or placental anomalies or any pelvic abnormalities.  Explained that the purpose of today's ultrasound is to assess for  presentation.  Patient acknowledges the purpose of the exam and the limitations of the study.     Fetal head in maternal LUQ. Sagittal suture visualized.

## 2022-02-05 LAB — GLUCOSE, CAPILLARY
Glucose-Capillary: 190 mg/dL — ABNORMAL HIGH (ref 70–99)
Glucose-Capillary: 99 mg/dL (ref 70–99)
Glucose-Capillary: 95 mg/dL (ref 70–99)

## 2022-02-05 NOTE — Lactation Note (Addendum)
This note was copied from a baby's chart. Lactation Consultation Note  Patient Name: Julie Brooks [id number] Date: 02/05/2022 Reason for consult: Late-preterm 34-36.6wks;Infant weight loss (-4% weight loss) Age:38 hours, AMA and DM see Birth Parent MR.  In-house Spanish Interpreter used " [name]" Infant had 5 voids and 10 stools since birth, Birth Parent has mostly been using donor breast milk. LC unable to observe latch due infant receiving 20 mls of donor breast milk at 1605 pm. Birth Parent would like to switch to 22 kcal formula tomorrow and work on latching infant at the breast. Birth Parent has not latched infant to breast today. LC discussed [facility]PTI feeding policy and pre-pump breast prior to latching infant and ask RN/LC for latch assistance if needed. Birth Parent has not been using the DEBP incorrectly had dial on the  highest setting and not using built in timer  so after 5 minutes Birth Parent would stop pumping due to pain with  pumping. Birth Parent current feeding plan: 1- LPTI feeding policy discussed , Birth Parent will attempt to latch infant 8 x ( every 3 hours ) within 24 hours, after few minutes if infant doesn't latch, Birth Parent will stop and supplement infant with donor breast milk Day 2 of life Birth Parent will offer infant 21 mls+ per feeding or more if infant wants per feeding. Spanish interpreter written supplement guidelines on [employer] for Day 2 and Day 3 and reviewed it with Birth Parent.  2- Birth Parent will pre-pump breast prior to latching infant with hand pump and ask RN/LC for latch assistance if needed, If Birth Parent latches infant at the breast ( Chest feeding) limit feeding to 15 minutes or less at the breast and then afterwards  supplement infant  with donor breast milk and limit total feeding to 30 minutes or less. 3- Birth Parent will pump only every 3 hours for 15 minutes on initial setting and give infant back any EBM first before offering donor  breast milk.  Maternal Data    Feeding Mother's Current Feeding Choice: Breast Milk and Donor Milk Nipple Type: Extra Slow Flow  LATCH Score                    Lactation Tools Discussed/Used Tools: Pump Breast pump type: Double-Electric Breast Pump Pump Education: Setup, frequency, and cleaning;Milk Storage Reason for Pumping: LPTI Pumping frequency: LC reviewed how to use DEBP, Birth Parent was pumping when LC left the room, Birth Parent will pump every 3 hours for 15 minutes on inital setting.  Interventions Interventions: Skin to skin;Pre-pump if needed;Position options;Education;Pace feeding;DEBP;LPT handout/interventions  Discharge Pump: Stork Pump (Birth Parent was given Stork DEBP today ( medela).)  Consult Status Consult Status: Follow-up Date: 02/06/22 Follow-up type: In-patient    Frederico Hamman 02/05/2022, 5:06 PM

## 2022-02-05 NOTE — Anesthesia Postprocedure Evaluation (Signed)
Anesthesia Post Note  Patient: Julie Brooks  Procedure(s) Performed: CESAREAN SECTION     Patient location during evaluation: PACU Anesthesia Type: Spinal Level of consciousness: oriented and awake and alert Pain management: pain level controlled Vital Signs Assessment: post-procedure vital signs reviewed and stable Respiratory status: spontaneous breathing, respiratory function stable and patient connected to nasal cannula oxygen Cardiovascular status: blood pressure returned to baseline and stable Postop Assessment: no headache, no backache and no apparent nausea or vomiting Anesthetic complications: no  No notable events documented.  Last Vitals:  Vitals:   02/04/22 2132 02/05/22 0619  BP: (!) 122/56 124/81  Pulse: 73 77  Resp: 18 16  Temp: 36.7 C 36.6 C  SpO2: 100% 99%    Last Pain:  Vitals:   02/05/22 0619  TempSrc: Oral  PainSc:    Pain Goal:                   Julie Brooks

## 2022-02-05 NOTE — Progress Notes (Signed)
Subjective: Postpartum Day 1: Cesarean Delivery Patient reports incisional pain, tolerating PO, and no problems voiding.    Objective: Vital signs in last 24 hours: Temp:  [97.9 F (36.6 C)-98.4 F (36.9 C)] 98.4 F (36.9 C) (12/17 1300) Pulse Rate:  [73-90] 90 (12/17 1300) Resp:  [16-20] 16 (12/17 1300) BP: (112-131)/(56-85) 112/75 (12/17 1300) SpO2:  [99 %-100 %] 99 % (12/17 1300)  Physical Exam:  General: alert, cooperative, and no distress Lochia: appropriate Uterine Fundus: firm Incision: Prevena in place and intact DVT Evaluation: No evidence of DVT seen on physical exam.  Recent Labs    02/03/22 2358 02/04/22 0515  HGB 12.1 10.1*  HCT 36.4 30.6*    Assessment/Plan: Status post Cesarean section. Doing well postoperatively.  Continue current care.  Scheryl Darter, MD 02/05/2022, 4:03 PM

## 2022-02-05 NOTE — Progress Notes (Signed)
Mom's blood sugar taken around 2330 (bed time glucose) on 12/16 was elevated due to mom drinking apple juice shortly before me taking her sugar.

## 2022-02-06 ENCOUNTER — Inpatient Hospital Stay (HOSPITAL_COMMUNITY)
Admission: RE | Admit: 2022-02-06 | Payer: Managed Care, Other (non HMO) | Source: Home / Self Care | Admitting: Family Medicine

## 2022-02-06 ENCOUNTER — Inpatient Hospital Stay (HOSPITAL_COMMUNITY): Payer: Managed Care, Other (non HMO)

## 2022-02-06 LAB — GLUCOSE, CAPILLARY
Glucose-Capillary: 152 mg/dL — ABNORMAL HIGH (ref 70–99)
Glucose-Capillary: 88 mg/dL (ref 70–99)
Glucose-Capillary: 130 mg/dL — ABNORMAL HIGH (ref 70–99)
Glucose-Capillary: 129 mg/dL — ABNORMAL HIGH (ref 70–99)

## 2022-02-06 MED ORDER — IBUPROFEN 600 MG PO TABS: 600.0000 mg | ORAL_TABLET | Freq: Four times a day (QID) | ORAL | 0 refills | Status: DC

## 2022-02-06 MED ORDER — FUROSEMIDE 20 MG PO TABS
20.0000 mg | ORAL_TABLET | Freq: Every day | ORAL | Status: DC
  Administered 2022-02-06: 20 mg via ORAL
  Filled 2022-02-06: qty 1

## 2022-02-06 MED ORDER — FUROSEMIDE 20 MG PO TABS: 20.0000 mg | ORAL_TABLET | Freq: Every day | ORAL | 0 refills | Status: AC

## 2022-02-06 MED ORDER — ACETAMINOPHEN 500 MG PO TABS: 1000.0000 mg | ORAL_TABLET | Freq: Four times a day (QID) | ORAL | 0 refills | Status: AC

## 2022-02-06 MED ORDER — OXYCODONE HCL 5 MG PO TABS: 5.0000 mg | ORAL_TABLET | ORAL | 0 refills | Status: AC | PRN

## 2022-02-06 NOTE — Progress Notes (Signed)
Blood sugar at 0407 was elevated due to mom having eaten some soup around that time.

## 2022-02-06 NOTE — Progress Notes (Signed)
Subjective: Postpartum Day 2: Cesarean Delivery Patient reports incisional pain, tolerating PO, and no problems voiding.    Objective: Vital signs in last 24 hours: Temp:  [98.2 F (36.8 C)-98.6 F (37 C)] 98.2 F (36.8 C) (12/18 0704) Pulse Rate:  [83-98] 86 (12/18 0900) Resp:  [16] 16 (12/17 1300) BP: (112-138)/(75-84) 126/83 (12/18 0900) SpO2:  [98 %-100 %] 99 % (12/18 0900)  Physical Exam:  General: alert, cooperative, and no distress Lochia: appropriate Uterine Fundus: firm Incision: Prevena in place and intact DVT Evaluation: No evidence of DVT seen on physical exam.  Recent Labs    02/03/22 2358 02/04/22 0515  HGB 12.1 10.1*  HCT 36.4 30.6*    Assessment/Plan: Status post Cesarean section. Doing well postoperatively.  Elevated BP readings. Start lasix x5 days. Ideally would like to monitor overnight but patient desires discharge. Consider if baby is being discharge. Will set up babyscripts.   Julie Bess Autry-Lott, DO 02/06/2022, 9:40 AM

## 2022-02-06 NOTE — Lactation Note (Signed)
This note was copied from a baby's chart. Lactation Consultation Note  Patient Name: Julie Brooks Date: 02/06/2022 Reason for consult: Follow-up assessment;Maternal endocrine disorder;Late-preterm 34-36.6wks Age:38 hours  LC in to visit with P3 Mom of LPTI.  Baby is at a 5.1% weight loss.  Baby has been a sleepy feeder and SLP provided a Dr. Theora Gianotti Preemie bottle following a consult.  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  With bottle feeding, LC pulled down on baby's chin to pull out lower jaw and flange lip.  Baby started feeding better.  Paced bottle fed baby 25 ml total.  Baby's tone is low.  Mom admitted to not pumping since yesterday. LC provided Mom with a hand's free pumping band and assisted her to pump on initiation setting with 21 mm flanges.  Washing and drying bin provided.  After baby fed, placed her STS between Mom's breasts under hand's free band.  Lactation Tools Discussed/Used Tools: Pump;Flanges;Bottle;Hands-free pumping top Flange Size: 21 Breast pump type: Double-Electric Breast Pump Pump Education: Setup, frequency, and cleaning;Milk Storage Reason for Pumping: Support full milk supply/LPTI Pumping frequency: Encouraged pumping with every feeding Pumped volume: 0 mL (Has not pumped today)  Interventions Interventions: Breast feeding basics reviewed;Skin to skin;Breast massage;Hand express;DEBP;Pace feeding  Discharge Pump: Stork Pump  Consult Status Consult Status: Follow-up Date: 02/07/22 Follow-up type: In-patient    Julie Brooks 02/06/2022, 2:53 PM

## 2022-02-06 NOTE — Progress Notes (Signed)
CBG=130 (before lunch). Pt states she ate 3 chicken minis from [employer] about one and a half hours prior to have blood sugar checked.  She is not hungry and will not be eating lunch now.  States she will try to eat around 1230.

## 2022-02-07 ENCOUNTER — Encounter: Payer: Self-pay | Admitting: Family Medicine

## 2022-02-07 ENCOUNTER — Other Ambulatory Visit: Payer: Self-pay | Admitting: Family Medicine

## 2022-02-07 LAB — SURGICAL PATHOLOGY

## 2022-02-08 ENCOUNTER — Other Ambulatory Visit: Payer: Managed Care, Other (non HMO)

## 2022-02-15 ENCOUNTER — Encounter: Payer: Self-pay | Admitting: Obstetrics and Gynecology

## 2022-02-15 ENCOUNTER — Ambulatory Visit (INDEPENDENT_AMBULATORY_CARE_PROVIDER_SITE_OTHER): Payer: Managed Care, Other (non HMO)

## 2022-02-15 VITALS — BP 122/79 | HR 95 | Ht 61.0 in | Wt 174.7 lb

## 2022-02-15 DIAGNOSIS — Z4889 Encounter for other specified surgical aftercare: Secondary | ICD-10-CM

## 2022-02-15 NOTE — Progress Notes (Signed)
Incision Check Visit  Julie Brooks is here for incision check following primary c-section on 02/04/22.   Assessment: Provena bandage removed, after 11 days post-placement, without complications; wound vac discontinued on it's own yesterday, 02/14/22. Patient tolerated well. Incision healing well and approximated--no signs of infection; no redness, swelling, or tenderness noted. Some irritation noted on stomach/area above incision from adhesive. Patient educated on how to wash and clean the incision and the surrounding area.   Education: Reviewed good wound care and s/s of infection with patient.  Patient will follow up post partum visit .  Meryl Crutch, RN 02/15/2022  11:35 AM

## 2022-02-16 ENCOUNTER — Other Ambulatory Visit: Payer: Managed Care, Other (non HMO)

## 2022-02-22 ENCOUNTER — Encounter: Payer: Managed Care, Other (non HMO) | Admitting: Obstetrics & Gynecology

## 2022-02-23 ENCOUNTER — Other Ambulatory Visit: Payer: Self-pay

## 2022-02-23 ENCOUNTER — Ambulatory Visit: Payer: Managed Care, Other (non HMO) | Admitting: Registered"

## 2022-02-23 ENCOUNTER — Encounter: Payer: Managed Care, Other (non HMO) | Attending: Obstetrics & Gynecology | Admitting: Registered"

## 2022-02-23 DIAGNOSIS — E119 Type 2 diabetes mellitus without complications: Secondary | ICD-10-CM | POA: Insufficient documentation

## 2022-02-23 DIAGNOSIS — Z3A30 30 weeks gestation of pregnancy: Secondary | ICD-10-CM | POA: Insufficient documentation

## 2022-02-23 DIAGNOSIS — Z794 Long term (current) use of insulin: Secondary | ICD-10-CM | POA: Insufficient documentation

## 2022-02-23 DIAGNOSIS — Z7984 Long term (current) use of oral hypoglycemic drugs: Secondary | ICD-10-CM | POA: Diagnosis not present

## 2022-02-23 DIAGNOSIS — O24113 Pre-existing diabetes mellitus, type 2, in pregnancy, third trimester: Secondary | ICD-10-CM | POA: Diagnosis present

## 2022-02-23 NOTE — Progress Notes (Signed)
T2D Post-partum Follow-Up Visit Appt Start Time: 8850  End Time: 1558  Primary concerns today: TD management after delivery (c-section)  [employer] Video interpreter Elita Quick [phone]  NUTRITION ASSESSMENT   Clinical Medical Hx: s/p c-section  Medications: (hospital discharge) metformin 1000 mg bid, furosemide (Lasix)  Labs: reviewed h/o A1c 10% in June. Notable Signs/Symptoms: no significant sxs  Lifestyle & Dietary Hx Although prior to pregnancy patient had uncontrolled diabetes, pt states her blood sugar was doing well in the hospital  Pt was concerned about blood pressure because she was told due to pre-eclampsia she was discharged on blood pressure medication. Pt states she was not able to get from the pharmacy and not sure if she needs it. Pt states at first blood pressure was in the high range, but recently is <130/90.  Pt states she was also told to take metformin but doesn't know how much and has not been taking it. Pt states she checked BG today 140 mg/dL, 2 hr PPBG meal with sour cream, bananas and strawberries. It doesn't appear she need medication at this time.  Pt states prior to pregnancy she was drinking a lot of soda, but has not been drinking since started working to control blood sugar, also does not eat anything greasy or fried.  Patient is not breastfeeding.  Estimated daily fluid intake: "quite a bit of water" oz Supplements: prenatal Sleep: not assessed Stress / self-care: not assessed Current average weekly physical activity: not assessed  24-Hr Dietary Recall - limited recall First Meal: sour cream, bananas and strawberries Snack:  Second Meal: Snack: Third Meal: chicken, squash & zucchini Snack: none Beverages: water, coffee  NUTRITION EDUCATION TOPICS Goals for blood sugar postpartum Goals for blood pressure Record keeping  Handouts Provided Include  Logs for blood pressure and blood sugar American Heart Association Instructions for checking blood  pressure at home  Learning Style & Readiness for Change Teaching method utilized: Visual & Auditory  Demonstrated degree of understanding via: Teach Back  Barriers to learning/adherence to lifestyle change: healing from c-section, but seems to be managing.  Progress/ Updates on Pt's Goals New goals   MONITORING & EVALUATION Dietary intake, weekly physical activity, and blood sugar & blood pressure in prn.  Next Steps  Patient is to keep track of blood sugar and pressure and take logs to MD post partum visit later this month. Clinical staff will call regarding concerns about BP medication.

## 2022-02-24 ENCOUNTER — Telehealth: Payer: Self-pay

## 2022-02-24 NOTE — Telephone Encounter (Signed)
Received message in the in basket from Marshell Garfinkel, Diabetes Educator that this patient had questions about blood pressure medication. Per chart review patient was not prescribed BP medication upon discharge. Per chart review pt did have mildly elevated BP in hospital and was discharged with Lasix Rx. Pt states she has completed Rx. Pt denies HA, vision changes, SOB, anxiety or RUQ pain. Pt does have BP cuff at home. I advised pt to check her BP at home if she begins to experience these symptoms. Pt has PP appointment 01/30 with Lamount Cohen MD. Pt aware. Pt verbalized understanding and denied further questions.

## 2022-03-01 ENCOUNTER — Encounter: Payer: Self-pay | Admitting: Obstetrics and Gynecology

## 2022-03-01 ENCOUNTER — Other Ambulatory Visit: Payer: Self-pay

## 2022-03-21 ENCOUNTER — Encounter: Payer: Self-pay | Admitting: Family Medicine

## 2022-03-21 ENCOUNTER — Ambulatory Visit (INDEPENDENT_AMBULATORY_CARE_PROVIDER_SITE_OTHER): Payer: Managed Care, Other (non HMO) | Admitting: Family Medicine

## 2022-03-21 ENCOUNTER — Other Ambulatory Visit: Payer: Self-pay

## 2022-03-21 DIAGNOSIS — E119 Type 2 diabetes mellitus without complications: Secondary | ICD-10-CM

## 2022-03-21 DIAGNOSIS — O24111 Pre-existing diabetes mellitus, type 2, in pregnancy, first trimester: Secondary | ICD-10-CM

## 2022-03-21 DIAGNOSIS — Z9851 Tubal ligation status: Secondary | ICD-10-CM | POA: Diagnosis not present

## 2022-03-21 MED ORDER — METFORMIN HCL 1000 MG PO TABS: 1000.0000 mg | ORAL_TABLET | Freq: Two times a day (BID) | ORAL | 11 refills | Status: AC

## 2022-03-21 MED ORDER — IBUPROFEN 600 MG PO TABS: 600.0000 mg | ORAL_TABLET | Freq: Four times a day (QID) | ORAL | 0 refills | Status: AC

## 2022-03-21 NOTE — Progress Notes (Signed)
[facility] Partum Visit Note  Julie Brooks is a 37 y.o. [phone] female who presents for a postpartum visit. She is 6 weeks postpartum following a primary low transverse cesarean section.  I have fully reviewed the prenatal and intrapartum course. The delivery was at [redacted]w[redacted]d gestational weeks.  Anesthesia: spinal. Postpartum course has been uneventful. Baby is doing well. Baby is feeding by bottle. Bleeding staining only. Bowel function is normal. Bladder function is normal. Patient is not sexually active. Contraception method is  BTL done intra-op . Postpartum depression screening: negative.   The pregnancy intention screening data noted above was reviewed. Potential methods of contraception were discussed. The patient elected to proceed with No data recorded.   Edinburgh Postnatal Depression Scale - 03/21/22 1640       Edinburgh Postnatal Depression Scale:  In the Past 7 Days   I have been able to laugh and see the funny side of things. 0    I have looked forward with enjoyment to things. 0    I have blamed myself unnecessarily when things went wrong. 0    I have been anxious or worried for no good reason. 0    I have felt scared or panicky for no good reason. 0    Things have been getting on top of me. 0    I have been so unhappy that I have had difficulty sleeping. 0    I have felt sad or miserable. 0    I have been so unhappy that I have been crying. 1    The thought of harming myself has occurred to me. 0    Edinburgh Postnatal Depression Scale Total 1             Health Maintenance Due  Topic Date Due   FOOT EXAM  Never done   OPHTHALMOLOGY EXAM  Never done   COVID-19 Vaccine (5 - 2023-24 season) 10/21/2021   HEMOGLOBIN A1C  01/20/2022    The following portions of the patient's history were reviewed and updated as appropriate: allergies, current medications, past family history, past medical history, past social history, past surgical history, and problem  list.  Review of Systems Pertinent items noted in HPI and remainder of comprehensive ROS otherwise negative.  Objective:  BP 133/88   Pulse 99   Wt 169 lb 3.2 oz (76.7 kg)   LMP 05/23/2021   Breastfeeding No   BMI 31.97 kg/m    General:  alert, cooperative, and appears stated age   Breasts:  not indicated  Lungs: Comfortalbe on room air  Wound well approximated incision  GU exam:  not indicated         Assessment:    There are no diagnoses linked to this encounter.  Normal postpartum exam.   Plan:   Essential components of care per ACOG recommendations:  1.  Mood and well being: Patient with negative depression screening today. Reviewed local resources for support.  - Patient tobacco use? No.   - hx of drug use? No.    2. Infant care and feeding:  -Patient currently breastmilk feeding? No.  -Social determinants of health (SDOH) reviewed in EPIC. No concerns  3. Sexuality, contraception and birth spacing - Patient does not want a pregnancy in the next year.  Desired family size is 3 children.  - Reviewed reproductive life planning. Reviewed contraceptive methods based on pt preferences and effectiveness.  Patient had bilateral salpingectomy at time of cesarean.   - Discussed birth  spacing of 18 months  4. Sleep and fatigue -Encouraged family/partner/community support of 4 hrs of uninterrupted sleep to help with mood and fatigue  5. Physical Recovery  - Discussed patients delivery and complications. She describes her labor as bad. - Patient had a C-section emergent.  - Patient has urinary incontinence? No. - Patient is safe to resume physical and sexual activity  6.  Health Maintenance - HM due items addressed No - up to date - Last pap smear  Diagnosis  Date Value Ref Range Status  08/04/2021   Final   - Negative for intraepithelial lesion or malignancy (NILM)   Pap smear not done at today's visit.  -Breast Cancer screening indicated? No.   7. Chronic  Disease/Pregnancy Condition follow up:  T2DM - instructed to find PCP to manage DM2. Refill sent for metformin.  - PCP follow up   Clarnce Flock, MD [facility] [place]

## 2022-04-17 ENCOUNTER — Encounter: Payer: Self-pay | Admitting: Family Medicine

## 2023-08-01 ENCOUNTER — Other Ambulatory Visit: Payer: Self-pay

## 2023-08-01 ENCOUNTER — Ambulatory Visit: Payer: Self-pay | Admitting: Physical Therapy

## 2023-08-01 DIAGNOSIS — M6281 Muscle weakness (generalized): Secondary | ICD-10-CM | POA: Insufficient documentation

## 2023-08-01 DIAGNOSIS — M25675 Stiffness of left foot, not elsewhere classified: Secondary | ICD-10-CM | POA: Insufficient documentation

## 2023-08-01 DIAGNOSIS — M79671 Pain in right foot: Secondary | ICD-10-CM | POA: Insufficient documentation

## 2023-08-01 DIAGNOSIS — M79672 Pain in left foot: Secondary | ICD-10-CM | POA: Insufficient documentation

## 2023-08-01 NOTE — Therapy (Signed)
 OUTPATIENT PHYSICAL THERAPY LOWER EXTREMITY EVALUATION   Patient Name: Julie Brooks MRN: [id number] DOB:Aug 24, 1985, 38 y.o., female Today's Date: 08/01/2023  END OF SESSION:  PT End of Session - 08/01/23 0853     Visit Number 1    Date for PT Re-Evaluation 09/28/23    Authorization Type [employer]    PT Start Time [phone]    PT Stop Time 0930    PT Time Calculation (min) 40 min    Activity Tolerance Patient tolerated treatment well             Past Medical History:  Diagnosis Date   Back pain    Hypertriglyceridemia 11/25/2020   09/29/2020   Placenta previa antepartum in third trimester 09/10/2021   10/05/21 [facility]    Poorly controlled diabetes mellitus (HCC) 09/02/2021   A1c 10.3 at 8wks   Past Surgical History:  Procedure Laterality Date   APPENDECTOMY     CESAREAN SECTION N/A 02/04/2022   Procedure: CESAREAN SECTION;  Surgeon: Verlyn Goad, MD;  Location: [facility];  Service: Obstetrics;  Laterality: N/A;   DILATION AND CURETTAGE OF UTERUS  2004   Patient Active Problem List   Diagnosis Date Noted   History of bilateral tubal ligation 03/21/2022   Controlled type 2 diabetes mellitus without complication (HCC) 02/23/2022   Hypertriglyceridemia 11/25/2020   History of preterm delivery 02/02/2017    PCP: Willeen Harold MD  REFERRING PROVIDER: Deidre Fat PA  REFERRING DIAG: 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  THERAPY DIAG: bil heel pain; weakness; difficulty in walking  Rationale for Evaluation and Treatment: Rehabilitation  ONSET DATE:(10 years) for years but came back stronger  SUBJECTIVE:   SUBJECTIVE STATEMENT: Bil heel pain; wearing flip flops (most comfortable); also wears sneaker but still hurts; bought an insert helps some; history of steroid injections but none recently; the doctor doesn't want to do another injection; I do massages but the pain doesn't go away Goes to the gym Has a 40 year old: the pain was  really bad while pregnant Interpreter present PERTINENT HISTORY: diabetes PAIN:   Are you having pain? No NPRS scale: 8/10 Pain location: around all sides of the heels left > right Pain orientation: Right and Left  PAIN TYPE: aching and burning Pain description: intermittent  Aggravating factors: being on feet; walking; rising, in the morning Relieving factors: soak in Epsom salt; sitting or lying down; frozen water  bottle rolling  PRECAUTIONS: None   WEIGHT BEARING RESTRICTIONS: No  FALLS:  Has patient fallen in last 6 months? 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  DIAGNOSTIC FINDINGS: none  PATIENT SURVEYS:  LEFS (in [place]) 45/80= 56.3%  COGNITION: Overall cognitive status: Within functional limits for tasks assessed      POSTURE: with weight bearing bil pes planus  PALPATION: Left > right heel tenderness  LOWER EXTREMITY ROM: decreased great toe mobility especially extension 10 degrees right and left; able to do a full squat with adequate ankle dorsiflexion but lacks toe extension;  ankle DF, PF, inversion and eversion ROM WFLs  LOWER EXTREMITY MMT:  SLS left 8 seconds, right 15 sec  MMT Right eval Left eval  Hip flexion  Hip extension    Hip abduction 5 4+  Hip adduction    Hip internal rotation    Hip external rotation    Knee flexion    Knee extension    Ankle dorsiflexion 4+ 4+  Ankle plantarflexion 4 4-  Ankle inversion 4 4  Ankle eversion 4 4   (Blank rows = not tested)   GAIT: Comments: decreased toe extension                                                                                                                                 TREATMENT DATE: 08/01/23 evaluation  Initial HEP  Stretch prior to getting out of bed/standing or after prolonged sitting prior to rising Recommend better footwear with arch support both home and at work Given coupon for [employer] and recommendation slip for [employer]    PATIENT EDUCATION:  Education details: Educated patient on anatomy and physiology of current symptoms, prognosis, plan of care as well as initial self care strategies to promote recovery Person educated: Patient Education method: Chief Technology Officer Education comprehension: verbalized understanding  HOME EXERCISE PROGRAM: Access Code: [email or web address] URL: [email or web address] Date: 08/01/2023 Prepared by: Darien Eden  Exercises - Seated Plantar Fascia Stretch  - 1 x daily - 7 x weekly - 1 sets - 3 reps - 20 hold - Soleus Stretch with Foot at Wall  - 1 x daily - 7 x weekly - 1 sets - 3 reps - 20 hold - Big toe mobilization  - 1 x daily - 7 x weekly - 1 sets - 3 reps - 20 hold - Quadruped Rocking Backward  - 1 x daily - 7 x weekly - 1 sets - 10 reps - Seated Calf Raise With Small Ball at Heels  - 1 x daily - 7 x weekly - 1 sets - 10 reps  ASSESSMENT:  CLINICAL IMPRESSION: Patient is a 38 y.o. female who was seen today for physical therapy evaluation and treatment of bil plantar fascitis. She has a very long history of heel pain left worse than right.  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  Moderate Bil pes planus with weight bearing.  Difficulty single leg balancing left worse than right.  Decreased great toe extension bilaterally.  Weakness in hip abductors, gastroc, soleus and posterior tibialis muscles.  Her functional outcome measure indicates moderate self perceived functional limitation.  She would benefit from skilled PT to address these limitations.    OBJECTIVE  IMPAIRMENTS: difficulty walking, decreased ROM, decreased strength, increased fascial restrictions, impaired perceived functional ability, postural dysfunction, and pain.   ACTIVITY LIMITATIONS: standing, squatting, locomotion level, and caring for others  PARTICIPATION LIMITATIONS: shopping, community activity, and occupation  PERSONAL FACTORS: Time since onset of injury/illness/exacerbation and 1 comorbidity: diabetes are also affecting  patient's functional outcome.   REHAB POTENTIAL: Good  CLINICAL DECISION MAKING: Stable/uncomplicated  EVALUATION COMPLEXITY: Low   GOALS: Goals reviewed with patient? Yes  SHORT TERM GOALS: Target date: 08/29/2023   The patient will demonstrate knowledge of basic self care strategies and exercises to promote healing  Baseline: Goal status: INITIAL  2.  Improved great toe extension to 20 degrees bil needed for walking and squatting Baseline:  Goal status: INITIAL  3.  The patient will report a 30% improvement in pain levels with functional activities which are currently difficult including getting out of bed, rising after sitting, standing and walking at work Baseline:  Goal status: INITIAL    LONG TERM GOALS: Target date: 09/28/2023    The patient will be independent in a safe self progression of a home exercise program to promote further recovery of function  Baseline:  Goal status: INITIAL  2.  Improved great toe extension to 30 degrees bil needed for walking and squatting Baseline:  Goal status: INITIAL  3.  Improved  Bil LE strength in ankles, feet, and hip  improved to grossly 4+/5 needed for prolong standing and walking at work Baseline:  Goal status: INITIAL  4.  The patient will report a 50% improvement in pain levels with functional activities which are currently difficult including getting out of bed, rising after sitting, standing and walking at work Baseline:  Goal status: INITIAL  5.  LEFS functional outcome score  improved to 46% indicating improved function with less pain Baseline:  Goal status: INITIAL    PLAN:  PT FREQUENCY: 1x/week  PT DURATION: 8 weeks  PLANNED INTERVENTIONS: 97164- PT Re-evaluation, 97110-Therapeutic exercises, 97530- Therapeutic activity, 97112- Neuromuscular re-education, 97535- Self Care, 38756- Manual therapy, [phone]- Aquatic Therapy, [id number]- Electrical stimulation (unattended), [phone]- Electrical stimulation (manual), 97016- Vasopneumatic device, [id number]- Ultrasound, [id number]- Ionotophoresis 4mg /ml Dexamethasone, Patient/Family education, Balance training, Taping, Joint mobilization, Cryotherapy, and Moist heat  PLAN FOR NEXT SESSION: review HEP as needed; bike; toe extension ROM; gastroc, soleus, posterior tib and foot intrinsic muscle strengthening; patient education;  glute strengthening; single leg exercise; iontophoresis if cert signed   Darien Eden, PT 08/01/23 7:29 PM Phone: [phone] Fax: [phone]

## 2023-08-09 ENCOUNTER — Ambulatory Visit: Admitting: Physical Therapy

## 2023-08-09 DIAGNOSIS — M79672 Pain in left foot: Secondary | ICD-10-CM

## 2023-08-09 DIAGNOSIS — M25675 Stiffness of left foot, not elsewhere classified: Secondary | ICD-10-CM

## 2023-08-09 DIAGNOSIS — M79671 Pain in right foot: Secondary | ICD-10-CM | POA: Diagnosis not present

## 2023-08-09 DIAGNOSIS — M6281 Muscle weakness (generalized): Secondary | ICD-10-CM

## 2023-08-09 NOTE — Therapy (Signed)
 OUTPATIENT PHYSICAL THERAPY LOWER EXTREMITY PROGRESS NOTE   Patient Name: Julie Brooks MRN: [id number] DOB:1985-06-22, 38 y.o., female Today's Date: 08/09/2023  END OF SESSION:  PT End of Session - 08/09/23 0851     Visit Number 2    Number of Visits 8    Date for PT Re-Evaluation 09/28/23    Authorization Type [employer] 8 VISITS 6/11-09/28/23    PT Start Time 0847    PT Stop Time 0930    PT Time Calculation (min) 43 min    Activity Tolerance Patient tolerated treatment well          Past Medical History:  Diagnosis Date   Back pain    Hypertriglyceridemia 11/25/2020   09/29/2020   Placenta previa antepartum in third trimester 09/10/2021   10[facility]    Poorly controlled diabetes mellitus (HCC) 09/02/2021   A1c 10.3 at 8wks   Past Surgical History:  Procedure Laterality Date   APPENDECTOMY     CESAREAN SECTION N/A 02/04/2022   Procedure: CESAREAN SECTION;  Surgeon: Verlyn Goad, MD;  Location: [facility];  Service: Obstetrics;  Laterality: N/A;   DILATION AND CURETTAGE OF UTERUS  2004   Patient Active Problem List   Diagnosis Date Noted   History of bilateral tubal ligation 03/21/2022   Controlled type 2 diabetes mellitus without complication (HCC) 02/23/2022   Hypertriglyceridemia 11/25/2020   History of preterm delivery 02/02/2017    PCP: Willeen Harold MD  REFERRING PROVIDER: Deidre Fat PA  REFERRING DIAG: 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  Knee pain has gone away.  Heaviness in heels persists.  Wearing flip flops today going to buy some other shoes to wear around the house.  I'm feeling better than I was the last time  I was here.   Goes to the gym Has a 40 year old: the pain was really bad while pregnant Interpreter present PERTINENT HISTORY: diabetes PAIN:   Are you having pain? No NPRS scale: 6/10 Pain location: around all sides of the heels left > right Pain orientation: Right and Left  PAIN TYPE: aching and burning Pain description: intermittent  Aggravating factors: being on feet; walking; rising, in the morning Relieving factors: soak in Epsom salt; sitting or lying down; frozen water  bottle rolling  PRECAUTIONS: None   WEIGHT BEARING RESTRICTIONS: No  FALLS:  Has patient fallen in last 6 months? 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  DIAGNOSTIC FINDINGS: none  PATIENT SURVEYS:  LEFS (in [place]) 45/80= 56.3%  COGNITION: Overall cognitive status: Within functional limits for tasks assessed      POSTURE: with weight bearing bil pes planus  PALPATION: Left > right heel tenderness  LOWER EXTREMITY ROM: decreased great toe mobility especially extension 10 degrees right and left; able to do a full squat with adequate ankle dorsiflexion but lacks toe extension;  ankle DF, PF, inversion and eversion ROM  WFLs  LOWER EXTREMITY MMT:  SLS left 8 seconds, right 15 sec  MMT Right eval Left eval  Hip flexion    Hip extension    Hip abduction 5 4+  Hip adduction    Hip internal rotation    Hip external rotation    Knee flexion    Knee extension    Ankle dorsiflexion 4+ 4+  Ankle plantarflexion 4 4-  Ankle inversion 4 4  Ankle eversion 4 4   (Blank rows = not tested)   GAIT: Comments: decreased toe extension  TREATMENT DATE: 08/09/23 Discussed option for home  footwear ([employer] flip flops or slides with arch support) Review of initial HEP: gastroc/soleus stretch at the wall, seated fascial stretch Seated red band ankle inversion 10x right/left (discomfort on left) Seated towel scrunch emphasizing the great toe (Added to HEP- see below) Seated toe yoga (Added to HEP- see below) Standing heel raises with lacrosse ball between heels 10x (Added to HEP- see below) Walking on a mat on the floor barefoot emphasizing a roll off pattern after heel strike with all toe extension at push off Seated red band great toe flexion 10x (Added to HEP- see below) given red band for home use Discussed how strengthening core and hip helps with ankle/foot alignment (Added to HEP- see below) Discussed her current gym program: bike, hip machines                                       TREATMENT DATE: 08/01/23 evaluation  Initial HEP  Stretch prior to getting out of bed/standing or after prolonged sitting prior to rising Recommend better footwear with arch support both home and at work Given coupon for [employer] and recommendation slip for [employer]    PATIENT EDUCATION:  Education details: Educated patient on anatomy and physiology of current symptoms, prognosis, plan of care as well as initial self care strategies to promote recovery Person educated: Patient Education method: Chief Technology Officer Education comprehension: verbalized understanding  HOME EXERCISE PROGRAM: Access Code: [email or web address] URL: [email or web address] Date: 08/09/2023 Prepared by: Darien Eden  Exercises - Seated Plantar Fascia Stretch  - 1 x daily - 7 x weekly - 1 sets - 3 reps - 20 hold - Soleus Stretch with Foot at Wall  - 1 x daily - 7 x weekly - 1 sets - 3 reps - 20 hold - Big toe mobilization  - 1 x daily - 7 x weekly - 1 sets - 3 reps - 20 hold - Quadruped Rocking Backward  - 1 x daily - 7 x weekly - 1 sets - 10 reps - Seated Calf Raise With Small Ball at Heels  - 1 x  daily - 7 x weekly - 1 sets - 10 reps - Standing Calf Raise With Small Ball at Heels  - 1 x daily - 7 x weekly - 1 sets - 10 reps - Towel Scrunches  - 1 x daily - 7 x weekly - 1 sets - 10 reps - Toe Yoga - Alternating Great Toe and Lesser Toe Extension  - 1 x daily - 7 x weekly - 1 sets - 10 reps - Heel-Toe Walking  - 1 x daily - 7 x weekly - 1 sets - 10 reps - Hooklying Sequential Leg March and Lower  - 1 x daily - 7 x weekly - 1 sets - 10 reps - Modified Side Plank  with Hip Abduction  - 1 x daily - 7 x weekly - 1 sets - 10 reps - Sidelying Hip Abduction  - 1 x daily - 7 x weekly - 1 sets - 10 reps - Great Toe Flexion with Resistance  - 1 x daily - 7 x weekly - 1 sets - 10 reps  ASSESSMENT:  CLINICAL IMPRESSION: Seira reports decreased pain since initial visit with wearing Hokas to work and doing the ex's.  She demonstrates good carryover with her initial HEP.  Much improved great toe extension noted with passive stretching.  When walking patient she uses a flat foot pattern that increases load on plantar fascia.  Therapist providing cues for toe extension at push off phase of gait.  Therapist progressing and updating HEP for increased intensity and challenge level for further strengthening and functional mobility.     OBJECTIVE IMPAIRMENTS: difficulty walking, decreased ROM, decreased strength, increased fascial restrictions, impaired perceived functional ability, postural dysfunction, and pain.   ACTIVITY LIMITATIONS: standing, squatting, locomotion level, and caring for others  PARTICIPATION LIMITATIONS: shopping, community activity, and occupation  PERSONAL FACTORS: Time since onset of injury/illness/exacerbation and 1 comorbidity: diabetes are also affecting patient's functional outcome.   REHAB POTENTIAL: Good  CLINICAL DECISION MAKING: Stable/uncomplicated  EVALUATION COMPLEXITY: Low   GOALS: Goals reviewed with patient? Yes  SHORT TERM GOALS: Target date: 08/29/2023   The  patient will demonstrate knowledge of basic self care strategies and exercises to promote healing  Baseline: Goal status: INITIAL  2.  Improved great toe extension to 20 degrees bil needed for walking and squatting Baseline:  Goal status: INITIAL  3.  The patient will report a 30% improvement in pain levels with functional activities which are currently difficult including getting out of bed, rising after sitting, standing and walking at work Baseline:  Goal status: INITIAL    LONG TERM GOALS: Target date: 09/28/2023    The patient will be independent in a safe self progression of a home exercise program to promote further recovery of function  Baseline:  Goal status: INITIAL  2.  Improved great toe extension to 30 degrees bil needed for walking and squatting Baseline:  Goal status: INITIAL  3.  Improved  Bil LE strength in ankles, feet, and hip  improved to grossly 4+/5 needed for prolong standing and walking at work Baseline:  Goal status: INITIAL  4.  The patient will report a 50% improvement in pain levels with functional activities which are currently difficult including getting out of bed, rising after sitting, standing and walking at work Baseline:  Goal status: INITIAL  5.  LEFS functional outcome score improved to 46% indicating improved function with less pain Baseline:  Goal status: INITIAL    PLAN:  PT FREQUENCY: 1x/week  PT DURATION: 8 weeks  PLANNED INTERVENTIONS: 97164- PT Re-evaluation, 97110-Therapeutic exercises, 97530- Therapeutic activity, 97112- Neuromuscular re-education, 97535- Self Care, 16109- Manual therapy, [phone]- Aquatic Therapy, U9811- Electrical stimulation (unattended), [phone]- Electrical stimulation (manual), 97016- Vasopneumatic device, [id number]- Ultrasound, [id number]- Ionotophoresis 4mg /ml Dexamethasone, Patient/Family education, Balance training, Taping, Joint mobilization, Cryotherapy, and Moist heat  PLAN FOR NEXT SESSION: review HEP as  needed; bike; toe extension ROM; gastroc, soleus, posterior tib and foot intrinsic muscle strengthening; patient education;  glute strengthening; single leg exercise; great toe strengthening; iontophoresis if cert signed   Darien Eden, PT 08/09/23 10:40 AM Phone: [phone] Fax: [phone]

## 2023-08-15 ENCOUNTER — Encounter: Payer: Self-pay | Admitting: Physical Therapy

## 2023-08-16 ENCOUNTER — Encounter: Payer: Self-pay | Admitting: Rehabilitative and Restorative Service Providers"

## 2023-08-16 ENCOUNTER — Ambulatory Visit: Admitting: Rehabilitative and Restorative Service Providers"

## 2023-08-16 DIAGNOSIS — M6281 Muscle weakness (generalized): Secondary | ICD-10-CM

## 2023-08-16 DIAGNOSIS — M79671 Pain in right foot: Secondary | ICD-10-CM | POA: Diagnosis not present

## 2023-08-16 DIAGNOSIS — M25675 Stiffness of left foot, not elsewhere classified: Secondary | ICD-10-CM

## 2023-08-16 NOTE — Therapy (Signed)
 OUTPATIENT PHYSICAL THERAPY TREATMENT NOTE   Patient Name: Julie Brooks MRN: [id number] DOB:[birth year], 38 y.o., female Today's Date: 08/16/2023  END OF SESSION:  PT End of Session - 08/16/23 1244     Visit Number 3    Date for PT Re-Evaluation 09/28/23    Authorization Type [employer] 8 VISITS 6/11-09/28/23    Authorization - Visit Number 3    Authorization - Number of Visits 8    PT Start Time 1239    PT Stop Time 1317    PT Time Calculation (min) 38 min    Activity Tolerance Patient tolerated treatment well    Behavior During Therapy [facility] for tasks assessed/performed          Past Medical History:  Diagnosis Date   Back pain    Hypertriglyceridemia 11/25/2020   09/29/2020   Placenta previa antepartum in third trimester 09/10/2021   10/05/21 at [facility]    Poorly controlled diabetes mellitus (HCC) 09/02/2021   A1c 10.3 at 8wks   Past Surgical History:  Procedure Laterality Date   APPENDECTOMY     CESAREAN SECTION N/A 02/04/2022   Procedure: CESAREAN SECTION;  Surgeon: Alger Gong, MD;  Location: [facility] LD ORS;  Service: Obstetrics;  Laterality: N/A;   DILATION AND CURETTAGE OF UTERUS  2004   Patient Active Problem List   Diagnosis Date Noted   History of bilateral tubal ligation 03/21/2022   Controlled type 2 diabetes mellitus without complication (HCC) 02/23/2022   Hypertriglyceridemia 11/25/2020   History of preterm delivery 02/02/2017    PCP: Virgilio Payor MD  REFERRING PROVIDER: Aloysius Case PA  REFERRING DIAG: 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  Patient states that she fell in the bathroom on Saturday.  States some  pain in her back as a result  Goes to the gym Has a 40 year old: the pain was really bad while pregnant Interpreter present  PERTINENT HISTORY: Diabetes  PAIN:   Are you having pain? Yes NPRS scale: 8-9/10 Pain location: around all sides of the heels left > right Pain orientation: Right and Left  PAIN TYPE: aching and burning Pain description: intermittent  Aggravating factors: being on feet; walking; rising, in the morning Relieving factors: soak in Epsom salt; sitting or lying down; frozen water  bottle rolling   PRECAUTIONS: None   WEIGHT BEARING RESTRICTIONS: No  FALLS:  Has patient fallen in last 6 months? 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  DIAGNOSTIC FINDINGS: none  PATIENT SURVEYS:  Eval:  LEFS (in [place]) 45/80= 56.3%  COGNITION: Overall cognitive status: Within functional limits for tasks assessed      POSTURE: with weight bearing bil pes planus  PALPATION: Left > right heel tenderness  LOWER EXTREMITY ROM: decreased great toe mobility especially extension 10 degrees right and left; able to do a full squat with adequate ankle dorsiflexion but lacks toe extension;  ankle DF,  PF, inversion and eversion ROM WFLs  LOWER EXTREMITY MMT:    MMT Right eval Left eval  Hip flexion    Hip extension    Hip abduction 5 4+  Hip adduction    Hip internal rotation    Hip external rotation    Knee flexion    Knee extension    Ankle dorsiflexion 4+ 4+  Ankle plantarflexion 4 4-  Ankle inversion 4 4  Ankle eversion 4 4   (Blank rows = not tested)  FUNCTIONAL ASSESSMENT: Eval: Single leg stance:  left 8 seconds, right 15  sec   GAIT: Comments: decreased toe extension  TREATMENT DATE:  08/16/2023 Recumbent bike level 2.0 x5 min with PT present to discuss status Standing calf stretch with 1/2 foam bolster 2x30 sec Standing hamstring stretch with foot on stairs:  2x20 sec bilat Seated toe towel scrunches x20 bilat Seated toe marble pick up 2x10 left LE Manual Therapy:  soft tissue mobilization to left foot and calf to promote tissue elongation, decreased ROM, and decreased pain Seated left ankle alphabet A-Z Seated transversus abdominus activation pressing ball into thighs x5   08/09/23 Discussed option for home footwear (Archies flip flops or slides with arch support) Review of initial HEP: gastroc/soleus stretch at the wall, seated fascial stretch Seated red band ankle inversion 10x right/left (discomfort on left) Seated towel scrunch emphasizing the great toe (Added to HEP- see below) Seated toe yoga (Added to HEP- see below) Standing heel raises with lacrosse ball between heels 10x (Added to HEP- see below) Walking on a mat on the floor barefoot emphasizing a roll off pattern after heel strike with all toe extension at push off Seated red band great toe flexion 10x (Added to HEP- see below) given red band for home use Discussed how strengthening core and hip helps with ankle/foot alignment (Added to HEP- see below) Discussed her current gym program: bike, hip machines                                    TREATMENT DATE: 08/01/23 evaluation  Initial HEP  Stretch prior to getting out of bed/standing or after prolonged sitting prior to rising Recommend better footwear with arch support both home and at work Given coupon for [employer] and recommendation slip for [employer]    PATIENT EDUCATION:  Education details: Educated patient on anatomy and physiology of current symptoms, prognosis, plan of care as well as initial self care strategies to promote recovery Person educated: Patient Education method:  Chief Technology Officer Education comprehension: verbalized understanding  HOME EXERCISE PROGRAM: Access Code: [email or web address] URL: [email or web address] Date: 08/09/2023 Prepared by: Glade Pesa  Exercises - Seated Plantar Fascia Stretch  - 1 x daily - 7 x weekly - 1 sets - 3 reps - 20 hold - Soleus Stretch with Foot at Wall  - 1 x daily - 7 x weekly - 1 sets - 3 reps - 20 hold - Big toe mobilization  - 1 x daily - 7 x weekly - 1 sets - 3 reps - 20 hold - Quadruped Rocking Backward  - 1 x daily - 7 x weekly - 1 sets - 10 reps - Seated Calf Raise With Small Ball at Heels  - 1 x daily - 7 x weekly - 1 sets - 10 reps - Standing Calf Raise With Small Ball at Heels  - 1 x daily - 7 x weekly -  1 sets - 10 reps - Towel Scrunches  - 1 x daily - 7 x weekly - 1 sets - 10 reps - Toe Yoga - Alternating Great Toe and Lesser Toe Extension  - 1 x daily - 7 x weekly - 1 sets - 10 reps - Heel-Toe Walking  - 1 x daily - 7 x weekly - 1 sets - 10 reps - Hooklying Sequential Leg March and Lower  - 1 x daily - 7 x weekly - 1 sets - 10 reps - Modified Side Plank with Hip Abduction  - 1 x daily - 7 x weekly - 1 sets - 10 reps - Sidelying Hip Abduction  - 1 x daily - 7 x weekly - 1 sets - 10 reps - Great Toe Flexion with Resistance  - 1 x daily - 7 x weekly - 1 sets - 10 reps  ASSESSMENT:  CLINICAL IMPRESSION: Ellery presents to skilled PT reporting a fall over the weekend while she was in the shower, stating that she has been having some increased pain in her low back region.  Patient states compliance with HEP and that the exercises do seem to be helping her, despite current increased pain.  Patient with great response to manual therapy and states that her pain decreased following soft tissue mobilization.  Patient educated in transversus abdominus activation exercise to assist her with improved core activation in standing during her 10 hour work shifts.  Pt continues to require skilled PT to progress  towards goal related activities.     OBJECTIVE IMPAIRMENTS: difficulty walking, decreased ROM, decreased strength, increased fascial restrictions, impaired perceived functional ability, postural dysfunction, and pain.   ACTIVITY LIMITATIONS: standing, squatting, locomotion level, and caring for others  PARTICIPATION LIMITATIONS: shopping, community activity, and occupation  PERSONAL FACTORS: Time since onset of injury/illness/exacerbation and 1 comorbidity: diabetes are also affecting patient's functional outcome.   REHAB POTENTIAL: Good  CLINICAL DECISION MAKING: Stable/uncomplicated  EVALUATION COMPLEXITY: Low   GOALS: Goals reviewed with patient? Yes  SHORT TERM GOALS: Target date: 08/29/2023   The patient will demonstrate knowledge of basic self care strategies and exercises to promote healing  Baseline: Goal status: Ongoing  2.  Improved great toe extension to 20 degrees bil needed for walking and squatting Baseline:  Goal status: Ongoing  3.  The patient will report a 30% improvement in pain levels with functional activities which are currently difficult including getting out of bed, rising after sitting, standing and walking at work Baseline:  Goal status: INITIAL    LONG TERM GOALS: Target date: 09/28/2023    The patient will be independent in a safe self progression of a home exercise program to promote further recovery of function  Baseline:  Goal status: INITIAL  2.  Improved great toe extension to 30 degrees bil needed for walking and squatting Baseline:  Goal status: INITIAL  3.  Improved  Bil LE strength in ankles, feet, and hip  improved to grossly 4+/5 needed for prolong standing and walking at work Baseline:  Goal status: INITIAL  4.  The patient will report a 50% improvement in pain levels with functional activities which are currently difficult including getting out of bed, rising after sitting, standing and walking at work Baseline:  Goal status:  INITIAL  5.  LEFS functional outcome score improved to 46% indicating improved function with less pain Baseline:  Goal status: INITIAL    PLAN:  PT FREQUENCY: 1x/week  PT DURATION: 8 weeks  PLANNED INTERVENTIONS:  02835- PT Re-evaluation, 97110-Therapeutic exercises, 97530- Therapeutic activity, [id number]- Neuromuscular re-education, [id number]- Self Care, 02859- Manual therapy, [phone]- Aquatic Therapy, [phone]- Electrical stimulation (unattended), [phone]- Electrical stimulation (manual), 97016- Vasopneumatic device, [id number]- Ultrasound, [id number]- Ionotophoresis 4mg /ml Dexamethasone, Patient/Family education, Balance training, Taping, Joint mobilization, Cryotherapy, and Moist heat  PLAN FOR NEXT SESSION: review HEP as needed; bike; toe extension ROM; gastroc, soleus, posterior tib and foot intrinsic muscle strengthening; patient education;  glute strengthening; single leg exercise; great toe strengthening; iontophoresis if cert signed     Jarrell Laming, PT, DPT 08/16/23, 1:29 PM  [facility] [address], [address] [place], [place] [address] Phone # [phone] Fax [phone]

## 2023-08-23 ENCOUNTER — Ambulatory Visit: Admitting: Rehabilitative and Restorative Service Providers"

## 2023-08-23 ENCOUNTER — Encounter: Payer: Self-pay | Admitting: Rehabilitative and Restorative Service Providers"

## 2023-08-23 DIAGNOSIS — M79671 Pain in right foot: Secondary | ICD-10-CM | POA: Diagnosis present

## 2023-08-23 DIAGNOSIS — M6281 Muscle weakness (generalized): Secondary | ICD-10-CM | POA: Insufficient documentation

## 2023-08-23 DIAGNOSIS — M79672 Pain in left foot: Secondary | ICD-10-CM | POA: Diagnosis present

## 2023-08-23 DIAGNOSIS — M25675 Stiffness of left foot, not elsewhere classified: Secondary | ICD-10-CM | POA: Insufficient documentation

## 2023-08-23 NOTE — Therapy (Signed)
 OUTPATIENT PHYSICAL THERAPY TREATMENT NOTE   Patient Name: Julie Brooks MRN: [id number] DOB:03/05/1985, 38 y.o., female Today's Date: 08/23/2023  END OF SESSION:  PT End of Session - 08/23/23 0849     Visit Number 4    Date for PT Re-Evaluation 09/28/23    Authorization Type [employer] 8 VISITS 6/11-09/28/23    Authorization - Visit Number 4    Authorization - Number of Visits 8    PT Start Time 0847    PT Stop Time 0925    PT Time Calculation (min) 38 min    Activity Tolerance Patient tolerated treatment well    Behavior During Therapy [facility] for tasks assessed/performed          Past Medical History:  Diagnosis Date   Back pain    Hypertriglyceridemia 11/25/2020   09/29/2020   Placenta previa antepartum in third trimester 09/10/2021   10/05/21 at [facility]    Poorly controlled diabetes mellitus (HCC) 09/02/2021   A1c 10.3 at 8wks   Past Surgical History:  Procedure Laterality Date   APPENDECTOMY     CESAREAN SECTION N/A 02/04/2022   Procedure: CESAREAN SECTION;  Surgeon: Alger Gong, MD;  Location: [facility] LD [facility]S;  Service: Obstetrics;  Laterality: N/A;   DILATION AND CURETTAGE OF UTERUS  2004   Patient Active Problem List   Diagnosis Date Noted   History of bilateral tubal ligation 03/21/2022   Controlled type 2 diabetes mellitus without complication (HCC) 02/23/2022   Hypertriglyceridemia 11/25/2020   History of preterm delivery 02/02/2017    PCP: Virgilio Payor MD  REFERRING PROVIDER: Aloysius Case PA  REFERRING DIAG: 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  States that she continues to have increased pain after working, but states that it is more mild than  before  Goes to the gym Has a 40 year old: the pain was really bad while pregnant Interpreter present  PERTINENT HISTORY: Diabetes  PAIN:   Are you having pain? Yes NPRS scale: 5/10 Pain location: around all sides of the heels left > right Pain orientation: Right and Left  PAIN TYPE: aching and burning Pain description: intermittent  Aggravating factors: being on feet; walking; rising, in the morning Relieving factors: soak in Epsom salt; sitting or lying down; frozen water  bottle rolling   PRECAUTIONS: None   WEIGHT BEARING RESTRICTIONS: No  FALLS:  Has patient fallen in last 6 months? 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  DIAGNOSTIC FINDINGS: none  PATIENT SURVEYS:  Eval:  LEFS (in [place]) 45/80= 56.3%  COGNITION: Overall cognitive status: Within functional limits for tasks assessed      POSTURE: with weight bearing bil pes planus  PALPATION: Left > right heel tenderness  LOWER EXTREMITY ROM: decreased great toe mobility especially extension 10 degrees right and left; able to do a full squat with adequate ankle dorsiflexion but lacks toe extension;  ankle DF, PF, inversion and eversion ROM WFLs  LOWER EXTREMITY  MMT:    MMT Right eval Left eval  Hip flexion    Hip extension    Hip abduction 5 4+  Hip adduction    Hip internal rotation    Hip external rotation    Knee flexion    Knee extension    Ankle dorsiflexion 4+ 4+  Ankle plantarflexion 4 4-  Ankle inversion 4 4  Ankle eversion 4 4   (Blank rows = not tested)  FUNCTIONAL ASSESSMENT: Eval: Single leg stance:  left 8 seconds, right 15 sec   GAIT: Comments: decreased toe  extension  TREATMENT DATE:  08/23/2023 Nustep level 4 x6 minutes with PT present to discuss status Standing calf stretch with 1/2 foam bolster 2x30 sec Standing hamstring stretch with foot on stairs:  2x20 sec bilat Standing heel raises on bottom step 2x10 Seated toe towel scrunches x20 bilat Seated toe marble pick up 2x10 bilat Seated 4 way ankle strengthening with yellow tband 2x10 bilat Seated left ankle alphabet A-Z bilat Manual Therapy:  soft tissue mobilization to left foot and calf to promote tissue elongation, decreased ROM, and decreased pain   08/16/2023 Recumbent bike level 2.0 x5 min with PT present to discuss status Standing calf stretch with 1/2 foam bolster 2x30 sec Standing hamstring stretch with foot on stairs:  2x20 sec bilat Seated toe towel scrunches x20 bilat Seated toe marble pick up 2x10 left LE Manual Therapy:  soft tissue mobilization to left foot and calf to promote tissue elongation, decreased ROM, and decreased pain Seated left ankle alphabet A-Z Seated transversus abdominus activation pressing ball into thighs x5   08/09/23 Discussed option for home footwear (Archies flip flops or slides with arch support) Review of initial HEP: gastroc/soleus stretch at the wall, seated fascial stretch Seated red band ankle inversion 10x right/left (discomfort on left) Seated towel scrunch emphasizing the great toe (Added to HEP- see below) Seated toe yoga (Added to HEP- see below) Standing heel raises with lacrosse ball between heels 10x (Added to HEP- see below) Walking on a mat on the floor barefoot emphasizing a roll off pattern after heel strike with all toe extension at push off Seated red band great toe flexion 10x (Added to HEP- see below) given red band for home use Discussed how strengthening core and hip helps with ankle/foot alignment (Added to HEP- see below) Discussed her current gym program: bike, hip machines    PATIENT EDUCATION:  Education  details: Educated patient on anatomy and physiology of current symptoms, prognosis, plan of care as well as initial self care strategies to promote recovery Person educated: Patient Education method: Explanation and Handouts Education comprehension: verbalized understanding  HOME EXERCISE PROGRAM: Access Code: [email or web address] URL: [email or web address] Date: 08/09/2023 Prepared by: Glade Pesa  Exercises - Seated Plantar Fascia Stretch  - 1 x daily - 7 x weekly - 1 sets - 3 reps - 20 hold - Soleus Stretch with Foot at Wall  - 1 x daily - 7 x weekly - 1 sets - 3 reps - 20 hold - Big toe mobilization  - 1 x daily - 7 x weekly - 1 sets - 3 reps - 20 hold - Quadruped Rocking Backward  - 1 x daily - 7 x weekly - 1 sets - 10 reps - Seated Calf Raise With Small Ball at Heels  - 1 x daily - 7 x weekly - 1 sets - 10 reps - Standing Calf Raise With Small Ball at Heels  - 1 x daily -  7 x weekly - 1 sets - 10 reps - Towel Scrunches  - 1 x daily - 7 x weekly - 1 sets - 10 reps - Toe Yoga - Alternating Great Toe and Lesser Toe Extension  - 1 x daily - 7 x weekly - 1 sets - 10 reps - Heel-Toe Walking  - 1 x daily - 7 x weekly - 1 sets - 10 reps - Hooklying Sequential Leg March and Lower  - 1 x daily - 7 x weekly - 1 sets - 10 reps - Modified Side Plank with Hip Abduction  - 1 x daily - 7 x weekly - 1 sets - 10 reps - Sidelying Hip Abduction  - 1 x daily - 7 x weekly - 1 sets - 10 reps - Great Toe Flexion with Resistance  - 1 x daily - 7 x weekly - 1 sets - 10 reps  ASSESSMENT:  CLINICAL IMPRESSION: Allani presents to skilled PT reporting that she is feeling better.  States that the manual therapy last session helped significantly.  Patient with improved ROM and decreased trigger points noted.  Patient continues to require skilled PT to progress towards goal related activities.   OBJECTIVE IMPAIRMENTS: difficulty walking, decreased ROM, decreased strength, increased fascial restrictions,  impaired perceived functional ability, postural dysfunction, and pain.   ACTIVITY LIMITATIONS: standing, squatting, locomotion level, and caring for others  PARTICIPATION LIMITATIONS: shopping, community activity, and occupation  PERSONAL FACTORS: Time since onset of injury/illness/exacerbation and 1 comorbidity: diabetes are also affecting patient's functional outcome.   REHAB POTENTIAL: Good  CLINICAL DECISION MAKING: Stable/uncomplicated  EVALUATION COMPLEXITY: Low   GOALS: Goals reviewed with patient? Yes  SHORT TERM GOALS: Target date: 08/29/2023   The patient will demonstrate knowledge of basic self care strategies and exercises to promote healing  Baseline: Goal status: Ongoing  2.  Improved great toe extension to 20 degrees bil needed for walking and squatting Baseline:  Goal status: Ongoing  3.  The patient will report a 30% improvement in pain levels with functional activities which are currently difficult including getting out of bed, rising after sitting, standing and walking at work Baseline:  Goal status: INITIAL    LONG TERM GOALS: Target date: 09/28/2023    The patient will be independent in a safe self progression of a home exercise program to promote further recovery of function  Baseline:  Goal status: INITIAL  2.  Improved great toe extension to 30 degrees bil needed for walking and squatting Baseline:  Goal status: INITIAL  3.  Improved  Bil LE strength in ankles, feet, and hip  improved to grossly 4+/5 needed for prolong standing and walking at work Baseline:  Goal status: INITIAL  4.  The patient will report a 50% improvement in pain levels with functional activities which are currently difficult including getting out of bed, rising after sitting, standing and walking at work Baseline:  Goal status: INITIAL  5.  LEFS functional outcome score improved to 46% indicating improved function with less pain Baseline:  Goal status:  INITIAL    PLAN:  PT FREQUENCY: 1x/week  PT DURATION: 8 weeks  PLANNED INTERVENTIONS: 97164- PT Re-evaluation, 97110-Therapeutic exercises, 97530- Therapeutic activity, [id number]- Neuromuscular re-education, 97535- Self Care, 02859- Manual therapy, [id number]- Aquatic Therapy, H9716- Electrical stimulation (unattended), [id number]- Electrical stimulation (manual), [id number]- Vasopneumatic device, [id number]- Ultrasound, [id number]- Ionotophoresis 4mg /ml Dexamethasone, Patient/Family education, Balance training, Taping, Joint mobilization, Cryotherapy, and Moist heat  PLAN FOR NEXT SESSION: review HEP as needed; bike; toe  extension ROM; gastroc, soleus, posterior tib and foot intrinsic muscle strengthening; patient education;  glute strengthening; single leg exercise; great toe strengthening; iontophoresis if cert signed     Jarrell Laming, PT, DPT 08/23/23, 10:29 AM  [facility] [address], [address] [place], [place] [address] Phone # [phone] Fax [phone]

## 2023-08-31 ENCOUNTER — Ambulatory Visit: Admitting: Rehabilitative and Restorative Service Providers"

## 2023-08-31 ENCOUNTER — Encounter: Payer: Self-pay | Admitting: Rehabilitative and Restorative Service Providers"

## 2023-08-31 DIAGNOSIS — M25675 Stiffness of left foot, not elsewhere classified: Secondary | ICD-10-CM

## 2023-08-31 DIAGNOSIS — M6281 Muscle weakness (generalized): Secondary | ICD-10-CM

## 2023-08-31 DIAGNOSIS — M79671 Pain in right foot: Secondary | ICD-10-CM

## 2023-08-31 NOTE — Patient Instructions (Signed)

## 2023-08-31 NOTE — Therapy (Signed)
 OUTPATIENT PHYSICAL THERAPY TREATMENT NOTE   Patient Name: Julie Brooks MRN: [id number] DOB:19-Sep-1985, 38 y.o., female Today's Date: 08/31/2023  END OF SESSION:  PT End of Session - 08/31/23 0939     Visit Number 5    Date for PT Re-Evaluation 09/28/23    Authorization Type [employer] 8 VISITS 6/11-09/28/23    Authorization - Visit Number 5    Authorization - Number of Visits 8    PT Start Time 0936    PT Stop Time 1018    PT Time Calculation (min) 42 min    Activity Tolerance Patient tolerated treatment well    Behavior During Therapy [facility] for tasks assessed/performed          Past Medical History:  Diagnosis Date   Back pain    Hypertriglyceridemia 11/25/2020   09/29/2020   Placenta previa antepartum in third trimester 09/10/2021   10/05/21 at [facility]    Poorly controlled diabetes mellitus (HCC) 09/02/2021   A1c 10.3 at 8wks   Past Surgical History:  Procedure Laterality Date   APPENDECTOMY     CESAREAN SECTION N/A 02/04/2022   Procedure: CESAREAN SECTION;  Surgeon: Alger Gong, MD;  Location: [facility] LD ORS;  Service: Obstetrics;  Laterality: N/A;   DILATION AND CURETTAGE OF UTERUS  2004   Patient Active Problem List   Diagnosis Date Noted   History of bilateral tubal ligation 03/21/2022   Controlled type 2 diabetes mellitus without complication (HCC) 02/23/2022   Hypertriglyceridemia 11/25/2020   History of preterm delivery 02/02/2017    PCP: Virgilio Payor MD  REFERRING PROVIDER: Aloysius Case PA  REFERRING DIAG: 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  Goes to the gym Has a 40 year old: the pain was really bad while pregnant Interpreter  present  PERTINENT HISTORY: Diabetes  PAIN:   Are you having pain? Yes NPRS scale: currently 3/10, but states at work pain increases at times to 8/10 Pain location: around all sides of the heels left > right Pain orientation: Right and Left  PAIN TYPE: aching and burning Pain description: intermittent  Aggravating factors: being on feet; walking; rising, in the morning Relieving factors: soak in Epsom salt; sitting or lying down; frozen water  bottle rolling   PRECAUTIONS: None   WEIGHT BEARING RESTRICTIONS: No  FALLS:  Has patient fallen in last 6 months? 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  DIAGNOSTIC FINDINGS: none  PATIENT SURVEYS:  Eval:  LEFS (in [place]) 45/80= 56.3%  COGNITION: Overall cognitive status: Within functional limits for tasks assessed      POSTURE: with weight bearing bil pes planus  PALPATION: Left > right heel tenderness  LOWER EXTREMITY ROM:  Eval: decreased great toe mobility especially extension 10 degrees right and left; able to do a full squat with adequate ankle dorsiflexion but lacks toe extension;  ankle DF, PF, inversion and eversion ROM WFLs  08/31/2023: 30 degrees great toe  bilat   LOWER EXTREMITY MMT:    MMT Right eval Left eval  Hip flexion    Hip extension    Hip abduction 5 4+  Hip adduction    Hip internal rotation    Hip external rotation    Knee flexion    Knee extension    Ankle dorsiflexion 4+ 4+  Ankle plantarflexion 4 4-  Ankle inversion 4 4  Ankle eversion 4 4   (Blank rows = not tested)  FUNCTIONAL ASSESSMENT: Eval: Single leg stance:  left 8 seconds, right 15 sec   GAIT: Comments:  decreased toe extension  TREATMENT DATE:  08/31/2023: Nustep level 5 x6 minutes with PT present to discuss status Standing calf stretch with 1/2 foam bolster 2x30 sec Standing rocker board for DF/PF x2 min Standing heel raises on 1/2 foam bolster 2x10 Seated hamstring stretch 2x20 sec bilat Seated 4 way ankle strengthening with red tband 2x10 bilat Seated BAPS board (level 2) CW/CCW x10 each bilat Single leg stance on foam pad performing 3 way clock taps x10 bilat with UE support of barre, as need Wobble board for standing balance x1 min with UE support, as needed Marching with alt UE raise with 5# dumbbells in each hand x10 bilat Iontopatch to bilat plantar surface (4mg /mL Dexamethasone) with 4 hour wear time   08/23/2023 Nustep level 4 x6 minutes with PT present to discuss status Standing calf stretch with 1/2 foam bolster 2x30 sec Standing hamstring stretch with foot on stairs:  2x20 sec bilat Standing heel raises on bottom step 2x10 Seated toe towel scrunches x20 bilat Seated toe marble pick up 2x10 bilat Seated 4 way ankle strengthening with yellow tband 2x10 bilat Seated left ankle alphabet A-Z bilat Manual Therapy:  soft tissue mobilization to left foot and calf to promote tissue elongation, decreased ROM, and decreased pain   08/16/2023 Recumbent bike level 2.0 x5 min with PT present to discuss status Standing calf stretch with 1/2 foam bolster 2x30 sec Standing hamstring stretch with foot on stairs:  2x20 sec bilat Seated toe towel scrunches x20 bilat Seated toe marble pick up 2x10 left LE Manual Therapy:  soft tissue mobilization to left foot and calf to promote tissue elongation, decreased ROM, and decreased pain Seated left ankle alphabet A-Z Seated transversus abdominus activation pressing ball into thighs x5     PATIENT EDUCATION:  Education details: Educated patient on anatomy and physiology of current symptoms, prognosis, plan of care as well as initial self  care strategies to promote recovery Person educated: Patient Education method: Explanation and Handouts Education comprehension: verbalized understanding  HOME EXERCISE PROGRAM: Access Code: [email or web address] URL: [email or web address] Date: 08/09/2023 Prepared by: Glade Pesa  Exercises - Seated Plantar Fascia Stretch  - 1 x daily - 7 x weekly - 1 sets - 3 reps - 20 hold - Soleus Stretch with Foot at Wall  - 1 x daily - 7 x weekly - 1 sets - 3 reps - 20 hold - Big toe mobilization  - 1 x daily - 7 x weekly - 1 sets - 3 reps - 20 hold - Quadruped Rocking Backward  - 1 x daily - 7 x weekly - 1 sets - 10 reps - Seated Calf Raise With Small Ball at Heels  - 1 x daily - 7 x weekly - 1 sets - 10 reps - Standing Calf Raise With Small Ball at Heels  - 1 x daily - 7 x weekly - 1 sets - 10 reps -  Towel Scrunches  - 1 x daily - 7 x weekly - 1 sets - 10 reps - Toe Yoga - Alternating Great Toe and Lesser Toe Extension  - 1 x daily - 7 x weekly - 1 sets - 10 reps - Heel-Toe Walking  - 1 x daily - 7 x weekly - 1 sets - 10 reps - Hooklying Sequential Leg March and Lower  - 1 x daily - 7 x weekly - 1 sets - 10 reps - Modified Side Plank with Hip Abduction  - 1 x daily - 7 x weekly - 1 sets - 10 reps - Sidelying Hip Abduction  - 1 x daily - 7 x weekly - 1 sets - 10 reps - Great Toe Flexion with Resistance  - 1 x daily - 7 x weekly - 1 sets - 10 reps  ASSESSMENT:  CLINICAL IMPRESSION: Howard presents to skilled PT reporting that she is feeling at least 80% better since starting PT.  States that she still has the most pain when working, but no longer has the pain at rest like she did initially.  Patient states that she continues to have the most pain when she works a shift of work with prolonged standing.  Patient able to progress with functional tasks and balance during session and increase to red theraband for resistance with ankle strengthening.  Patient educated about iontopatch and was  agreeable to treatment and educated to leave patch on for 4 hours and remove at home.  Patient has met all short-term goals and is progressing towards long term goals.  Patient continues to require skilled PT to progress towards goal related activities.  OBJECTIVE IMPAIRMENTS: difficulty walking, decreased ROM, decreased strength, increased fascial restrictions, impaired perceived functional ability, postural dysfunction, and pain.   ACTIVITY LIMITATIONS: standing, squatting, locomotion level, and caring for others  PARTICIPATION LIMITATIONS: shopping, community activity, and occupation  PERSONAL FACTORS: Time since onset of injury/illness/exacerbation and 1 comorbidity: diabetes are also affecting patient's functional outcome.   REHAB POTENTIAL: Good  CLINICAL DECISION MAKING: Stable/uncomplicated  EVALUATION COMPLEXITY: Low   GOALS: Goals reviewed with patient? Yes  SHORT TERM GOALS: Target date: 08/29/2023   The patient will demonstrate knowledge of basic self care strategies and exercises to promote healing  Baseline: Goal status: Met on 08/31/2023  2.  Improved great toe extension to 20 degrees bil needed for walking and squatting Baseline:  Goal status: Met on 08/31/23  3.  The patient will report a 30% improvement in pain levels with functional activities which are currently difficult including getting out of bed, rising after sitting, standing and walking at work Baseline:  Goal status: Met on 08/31/23    LONG TERM GOALS: Target date: 09/28/2023    The patient will be independent in a safe self progression of a home exercise program to promote further recovery of function  Baseline:  Goal status: Ongoing  2.  Improved great toe extension to 30 degrees bil needed for walking and squatting Baseline:  Goal status: Met on 08/31/23  3.  Improved  Bil LE strength in ankles, feet, and hip  improved to grossly 4+/5 needed for prolong standing and walking at work Baseline:   Goal status: Ongoing  4.  The patient will report a 50% improvement in pain levels with functional activities which are currently difficult including getting out of bed, rising after sitting, standing and walking at work Baseline:  Goal status: Met on 08/31/23 with reports of 80% improvement  5.  LEFS functional outcome score improved to 46% indicating improved function with less pain Baseline:  Goal status: INITIAL    PLAN:  PT FREQUENCY: 1x/week  PT DURATION: 8 weeks  PLANNED INTERVENTIONS: 02835- PT Re-evaluation, 97110-Therapeutic exercises, 97530- Therapeutic activity, 97112- Neuromuscular re-education, 97535- Self Care, 02859- Manual therapy, [phone]- Aquatic Therapy, [phone]- Electrical stimulation (unattended), [phone]- Electrical stimulation (manual), 97016- Vasopneumatic device, [id number]- Ultrasound, 02966- Ionotophoresis 4mg /ml Dexamethasone, Patient/Family education, Balance training, Taping, Joint mobilization, Cryotherapy, and Moist heat  PLAN FOR NEXT SESSION: review HEP as needed; bike; toe extension ROM; gastroc, soleus, posterior tib and foot intrinsic muscle strengthening; patient education;  glute strengthening; single leg exercise; great toe strengthening; iontophoresis if cert signed     Jarrell Laming, PT, DPT 08/31/23, 11:11 AM  [facility] [address], [address] [place], [place] [address] Phone # [phone] Fax [phone]

## 2023-09-07 ENCOUNTER — Encounter: Payer: Self-pay | Admitting: Rehabilitative and Restorative Service Providers"

## 2023-09-07 ENCOUNTER — Ambulatory Visit: Admitting: Rehabilitative and Restorative Service Providers"

## 2023-09-07 DIAGNOSIS — M25675 Stiffness of left foot, not elsewhere classified: Secondary | ICD-10-CM

## 2023-09-07 DIAGNOSIS — M6281 Muscle weakness (generalized): Secondary | ICD-10-CM

## 2023-09-07 DIAGNOSIS — M79671 Pain in right foot: Secondary | ICD-10-CM

## 2023-09-07 NOTE — Therapy (Signed)
 OUTPATIENT PHYSICAL THERAPY TREATMENT NOTE   Patient Name: Julie Brooks MRN: [id number] DOB:1985/06/01, 38 y.o., female Today's Date: 09/07/2023  END OF SESSION:  PT End of Session - 09/07/23 1102     Visit Number 6    Date for PT Re-Evaluation 09/28/23    Authorization Type [employer] 8 VISITS 6/11-09/28/23    Authorization - Visit Number 6    Authorization - Number of Visits 8    PT Start Time 1100    PT Stop Time 1145    PT Time Calculation (min) 45 min    Activity Tolerance Patient tolerated treatment well    Behavior During Therapy [facility] for tasks assessed/performed          Past Medical History:  Diagnosis Date   Back pain    Hypertriglyceridemia 11/25/2020   09/29/2020   Placenta previa antepartum in third trimester 09/10/2021   10/05/21 at [facility]    Poorly controlled diabetes mellitus (HCC) 09/02/2021   A1c 10.3 at 8wks   Past Surgical History:  Procedure Laterality Date   APPENDECTOMY     CESAREAN SECTION N/A 02/04/2022   Procedure: CESAREAN SECTION;  Surgeon: Alger Gong, MD;  Location: [facility] LD [facility]S;  Service: Obstetrics;  Laterality: N/A;   DILATION AND CURETTAGE OF UTERUS  2004   Patient Active Problem List   Diagnosis Date Noted   History of bilateral tubal ligation 03/21/2022   Controlled type 2 diabetes mellitus without complication (HCC) 02/23/2022   Hypertriglyceridemia 11/25/2020   History of preterm delivery 02/02/2017    PCP: Virgilio Payor MD  REFERRING PROVIDER: Aloysius Case PA  REFERRING DIAG: 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  She states that she also purchased new sandals that are helping.  Goes to the gym Has a 40 year old: the  pain was really bad while pregnant Interpreter present  PERTINENT HISTORY: Diabetes  PAIN:   Are you having pain? Yes NPRS scale: 3/10 Pain location: around all sides of the heels left > right Pain orientation: Right and Left  PAIN TYPE: aching and burning Pain description: intermittent  Aggravating factors: being on feet; walking; rising, in the morning Relieving factors: soak in Epsom salt; sitting or lying down; frozen water  bottle rolling   PRECAUTIONS: None   WEIGHT BEARING RESTRICTIONS: No  FALLS:  Has patient fallen in last 6 months? 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  DIAGNOSTIC FINDINGS: none  PATIENT SURVEYS:  Eval:  LEFS (in [place]) 45/80= 56.3%  COGNITION: Overall cognitive status: Within functional limits for tasks assessed      POSTURE: with weight bearing bil pes planus  PALPATION: Left > right heel tenderness  LOWER EXTREMITY ROM:  Eval: decreased great toe mobility especially extension 10 degrees right and left; able to do a full squat with adequate ankle dorsiflexion but lacks toe extension;  ankle DF, PF, inversion and eversion ROM WFLs  08/31/2023: 30 degrees great toe bilat   LOWER  EXTREMITY MMT:    MMT Right eval Left eval  Hip flexion    Hip extension    Hip abduction 5 4+  Hip adduction    Hip internal rotation    Hip external rotation    Knee flexion    Knee extension    Ankle dorsiflexion 4+ 4+  Ankle plantarflexion 4 4-  Ankle inversion 4 4  Ankle eversion 4 4   (Blank rows = not tested)  FUNCTIONAL ASSESSMENT: Eval: Single leg stance:  left 8 seconds, right 15 sec   GAIT: Comments: decreased toe  extension  TREATMENT DATE:  09/07/2023: Nustep level 5 x6 minutes with PT present to discuss status Standing hamstring stretch at stairs 2x20 sec bilat Standing unilateral calf stretch on bottom step 2x20 sec bilat Standing heel raise on bottom step with UE support 2x10 Standing on flat side of bosu for static balance x1 min Standing mini squats on flat side of bosu 2x10 Standing on flat side of bosu performing around the worlds with 10# kettle bell x10 bilat Seated BAPS board (level 2) CW/CCW x10 each bilat Marching with alt UE raise with 5# dumbbells in each hand x10 bilat Seated 4 way ankle strengthening with green tband 2x10 bilat Iontopatch to bilat plantar surface (4mg /mL Dexamethasone) with 4 hour wear time (2/6)   08/31/2023: Nustep level 5 x6 minutes with PT present to discuss status Standing calf stretch with 1/2 foam bolster 2x30 sec Standing rocker board for DF/PF x2 min Standing heel raises on 1/2 foam bolster 2x10 Seated hamstring stretch 2x20 sec bilat Seated 4 way ankle strengthening with red tband 2x10 bilat Seated BAPS board (level 2) CW/CCW x10 each bilat Single leg stance on foam pad performing 3 way clock taps x10 bilat with UE support of barre, as need Wobble board for standing balance x1 min with UE support, as needed Marching with alt UE raise with 5# dumbbells in each hand x10 bilat Iontopatch to bilat plantar surface (4mg /mL Dexamethasone) with 4 hour wear time (1/6)   08/23/2023 Nustep level 4 x6 minutes with PT present to discuss status Standing calf stretch with 1/2 foam bolster 2x30 sec Standing hamstring stretch with foot on stairs:  2x20 sec bilat Standing heel raises on bottom step 2x10 Seated toe towel scrunches x20 bilat Seated toe marble pick up 2x10 bilat Seated 4 way ankle strengthening with yellow tband 2x10 bilat Seated left ankle alphabet A-Z bilat Manual Therapy:  soft tissue mobilization to left foot and calf to promote tissue elongation,  decreased ROM, and decreased pain     PATIENT EDUCATION:  Education details: Educated patient on anatomy and physiology of current symptoms, prognosis, plan of care as well as initial self care strategies to promote recovery Person educated: Patient Education method: Explanation and Handouts Education comprehension: verbalized understanding  HOME EXERCISE PROGRAM: Access Code: [email or web address] URL: [email or web address] Date: 08/09/2023 Prepared by: Glade Pesa  Exercises - Seated Plantar Fascia Stretch  - 1 x daily - 7 x weekly - 1 sets - 3 reps - 20 hold - Soleus Stretch with Foot at Wall  - 1 x daily - 7 x weekly - 1 sets - 3 reps - 20 hold - Big toe mobilization  - 1 x daily - 7 x weekly - 1 sets - 3 reps - 20 hold - Quadruped Rocking Backward  - 1 x daily - 7 x weekly - 1 sets - 10 reps - Seated Calf Raise With Small Ball at Heels  - 1  x daily - 7 x weekly - 1 sets - 10 reps - Standing Calf Raise With Small Ball at Heels  - 1 x daily - 7 x weekly - 1 sets - 10 reps - Towel Scrunches  - 1 x daily - 7 x weekly - 1 sets - 10 reps - Toe Yoga - Alternating Great Toe and Lesser Toe Extension  - 1 x daily - 7 x weekly - 1 sets - 10 reps - Heel-Toe Walking  - 1 x daily - 7 x weekly - 1 sets - 10 reps - Hooklying Sequential Leg March and Lower  - 1 x daily - 7 x weekly - 1 sets - 10 reps - Modified Side Plank with Hip Abduction  - 1 x daily - 7 x weekly - 1 sets - 10 reps - Sidelying Hip Abduction  - 1 x daily - 7 x weekly - 1 sets - 10 reps - Great Toe Flexion with Resistance  - 1 x daily - 7 x weekly - 1 sets - 10 reps  ASSESSMENT:  CLINICAL IMPRESSION: Julie Brooks presents to skilled PT reporting that the iontopatch helped a lot last visit and she is interested in it again today.  Patient able to progress during session today with increased static and dynamic balance exercises.  Pt able to progress with green theraband for increased strengthening.  Patient with decreased  difficulty with ROM using BAPS board.  Patient reports that the new sneakers and sandals that she purchased do seem to help with her decreased pain.  Patient with iontopatches applied at end of session and verbalized understanding to remove in 4 hours.  Patient continues to require skilled PT to progress towards goal related activities.  OBJECTIVE IMPAIRMENTS: difficulty walking, decreased ROM, decreased strength, increased fascial restrictions, impaired perceived functional ability, postural dysfunction, and pain.   ACTIVITY LIMITATIONS: standing, squatting, locomotion level, and caring for others  PARTICIPATION LIMITATIONS: shopping, community activity, and occupation  PERSONAL FACTORS: Time since onset of injury/illness/exacerbation and 1 comorbidity: diabetes are also affecting patient's functional outcome.   REHAB POTENTIAL: Good  CLINICAL DECISION MAKING: Stable/uncomplicated  EVALUATION COMPLEXITY: Low   GOALS: Goals reviewed with patient? Yes  SHORT TERM GOALS: Target date: 08/29/2023   The patient will demonstrate knowledge of basic self care strategies and exercises to promote healing  Baseline: Goal status: Met on 08/31/2023  2.  Improved great toe extension to 20 degrees bil needed for walking and squatting Baseline:  Goal status: Met on 08/31/23  3.  The patient will report a 30% improvement in pain levels with functional activities which are currently difficult including getting out of bed, rising after sitting, standing and walking at work Baseline:  Goal status: Met on 08/31/23    LONG TERM GOALS: Target date: 09/28/2023    The patient will be independent in a safe self progression of a home exercise program to promote further recovery of function  Baseline:  Goal status: Ongoing  2.  Improved great toe extension to 30 degrees bil needed for walking and squatting Baseline:  Goal status: Met on 08/31/23  3.  Improved  Bil LE strength in ankles, feet, and hip   improved to grossly 4+/5 needed for prolong standing and walking at work Baseline:  Goal status: Ongoing  4.  The patient will report a 50% improvement in pain levels with functional activities which are currently difficult including getting out of bed, rising after sitting, standing and walking at work Baseline:  Goal  status: Met on 08/31/23 with reports of 80% improvement  5.  LEFS functional outcome score improved to 46% indicating improved function with less pain Baseline:  Goal status: INITIAL    PLAN:  PT FREQUENCY: 1x/week  PT DURATION: 8 weeks  PLANNED INTERVENTIONS: 97164- PT Re-evaluation, 97110-Therapeutic exercises, 97530- Therapeutic activity, 97112- Neuromuscular re-education, 97535- Self Care, 02859- Manual therapy, [phone]- Aquatic Therapy, [phone]- Electrical stimulation (unattended), [phone]- Electrical stimulation (manual), 97016- Vasopneumatic device, [id number]- Ultrasound, [id number]- Ionotophoresis 4mg /ml Dexamethasone, Patient/Family education, Balance training, Taping, Joint mobilization, Cryotherapy, and Moist heat  PLAN FOR NEXT SESSION: review HEP as needed; bike; toe extension ROM; gastroc, soleus, posterior tib and foot intrinsic muscle strengthening; patient education;  glute strengthening; single leg exercise; great toe strengthening; iontophoresis if indicated    Jarrell Laming, PT, DPT 09/07/23, 12:24 PM  [facility] [address] [place], [place] [address] Phone # [phone] Fax [phone]

## 2023-09-14 ENCOUNTER — Encounter: Payer: Self-pay | Admitting: Rehabilitative and Restorative Service Providers"

## 2023-09-14 ENCOUNTER — Ambulatory Visit: Admitting: Rehabilitative and Restorative Service Providers"

## 2023-09-14 DIAGNOSIS — M79671 Pain in right foot: Secondary | ICD-10-CM | POA: Diagnosis not present

## 2023-09-14 DIAGNOSIS — M6281 Muscle weakness (generalized): Secondary | ICD-10-CM

## 2023-09-14 DIAGNOSIS — M25675 Stiffness of left foot, not elsewhere classified: Secondary | ICD-10-CM

## 2023-09-14 NOTE — Therapy (Signed)
 OUTPATIENT PHYSICAL THERAPY TREATMENT NOTE   Patient Name: Julie Brooks MRN: [id number] DOB:1985-06-25, 38 y.o., female Today's Date: 09/14/2023  END OF SESSION:  PT End of Session - 09/14/23 1152     Visit Number 7    Number of Visits 8    Date for PT Re-Evaluation 09/28/23    Authorization Type [employer] 8 VISITS 6/11-09/28/23    Authorization - Visit Number 7    Authorization - Number of Visits 8    PT Start Time 1100    PT Stop Time 1143    PT Time Calculation (min) 43 min    Activity Tolerance Patient tolerated treatment well    Behavior During Therapy [facility] for tasks assessed/performed           Past Medical History:  Diagnosis Date   Back pain    Hypertriglyceridemia 11/25/2020   09/29/2020   Placenta previa antepartum in third trimester 09/10/2021   10/05/21 at [facility].    Poorly controlled diabetes mellitus (HCC) 09/02/2021   A1c 10.3 at 8wks   Past Surgical History:  Procedure Laterality Date   APPENDECTOMY     CESAREAN SECTION N/A 02/04/2022   Procedure: CESAREAN SECTION;  Surgeon: Alger Gong, MD;  Location: [facility] LD [facility];  Service: Obstetrics;  Laterality: N/A;   DILATION AND CURETTAGE OF UTERUS  2004   Patient Active Problem List   Diagnosis Date Noted   History of bilateral tubal ligation 03/21/2022   Controlled type 2 diabetes mellitus without complication (HCC) 02/23/2022   Hypertriglyceridemia 11/25/2020   History of preterm delivery 02/02/2017    PCP: Virgilio Payor MD  REFERRING PROVIDER: Aloysius Case PA  REFERRING DIAG: 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 She states she's even working more hours, around more than 40 hours a week. Goes to gym  once a week.   Has a 40 year old: the pain was really bad while pregnant   PERTINENT HISTORY: Diabetes  PAIN:   Are you having pain? Yes NPRS scale: 1.5/10 Pain location: around all sides of the heels left > right Pain orientation: Right and Left  PAIN TYPE: aching and burning Pain description: intermittent  Aggravating factors: being on feet; walking; rising, in the morning Relieving factors: soak in Epsom salt; sitting or lying down; frozen water  bottle rolling   PRECAUTIONS: None   WEIGHT BEARING RESTRICTIONS: No  FALLS:  Has patient fallen in last 6 months? 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  DIAGNOSTIC FINDINGS: none  PATIENT SURVEYS:  Eval:  LEFS (in [place]) 45/80= 56.3%  COGNITION: Overall cognitive status: Within functional limits for tasks assessed      POSTURE: with weight bearing bil pes planus  PALPATION: Left > right heel tenderness  LOWER EXTREMITY ROM:  Eval: decreased great toe mobility especially extension 10 degrees right and left; able to do a full squat with adequate ankle dorsiflexion but lacks toe extension;  ankle DF, PF, inversion and  eversion ROM WFLs  08/31/2023: 30 degrees great toe bilat   LOWER EXTREMITY MMT:    MMT Right eval Left eval  Hip flexion    Hip extension    Hip abduction 5 4+  Hip adduction    Hip internal rotation    Hip external rotation    Knee flexion    Knee extension    Ankle dorsiflexion 4+ 4+  Ankle plantarflexion 4 4-  Ankle inversion 4 4  Ankle eversion 4 4   (Blank rows = not tested)  FUNCTIONAL ASSESSMENT: Eval: Single leg stance:  left 8 seconds, right 15  sec   GAIT: Comments: decreased toe extension  TREATMENT DATE:   09/14/23 Nustep level 5 x5 minutes with PT present to discuss status Standing hamstring stretch at stairs 2x20 sec bilat Standing unilateral calf stretch on bottom step 2x20 sec bilat Standing heel raise on bottom step with UE support 2x10 Standing on flat side of bosu for static balance x1 min Standing mini squats on flat side of bosu 2x10 Standing on flat side of bosu performing around the worlds with 10# kettle bell x10 bilat Marching with alt UE raise with 7# dumbbells in each hand x10 bilat Seated ball press in lap for abdominal strengthening 2x10 Seated ball press in lap for oblique strengthening 1x10 Seated ball press at ankles for lower leg strengthening 2x10 Seated 4 way ankle strengthening with green tband 2x10 bilat Iontopatch to bilat plantar surface (4mg /mL Dexamethasone) with 4 hour wear time (3/6)   09/07/2023: Nustep level 5 x6 minutes with PT present to discuss status Standing hamstring stretch at stairs 2x20 sec bilat Standing unilateral calf stretch on bottom step 2x20 sec bilat Standing heel raise on bottom step with UE support 2x10 Standing on flat side of bosu for static balance x1 min Standing mini squats on flat side of bosu 2x10 Standing on flat side of bosu performing around the worlds with 10# kettle bell x10 bilat Seated BAPS board (level 2) CW/CCW x10 each bilat Marching with alt UE raise with 5# dumbbells in each hand x10 bilat Seated 4 way ankle strengthening with green tband 2x10 bilat Iontopatch to bilat plantar surface (4mg /mL Dexamethasone) with 4 hour wear time (2/6)   08/31/2023: Nustep level 5 x6 minutes with PT present to discuss status Standing calf stretch with 1/2 foam bolster 2x30 sec Standing rocker board for DF/PF x2 min Standing heel raises on 1/2 foam bolster 2x10 Seated hamstring stretch 2x20 sec bilat Seated 4 way ankle strengthening with red tband 2x10  bilat Seated BAPS board (level 2) CW/CCW x10 each bilat Single leg stance on foam pad performing 3 way clock taps x10 bilat with UE support of barre, as need Wobble board for standing balance x1 min with UE support, as needed Marching with alt UE raise with 5# dumbbells in each hand x10 bilat Iontopatch to bilat plantar surface (4mg /mL Dexamethasone) with 4 hour wear time (1/6)      PATIENT EDUCATION:  Education details: Educated patient on anatomy and physiology of current symptoms, prognosis, plan of care as well as initial self care strategies to promote recovery Person educated: Patient Education method: Explanation and Handouts Education comprehension: verbalized understanding  HOME EXERCISE PROGRAM: Access Code: [email or web address] URL: [email or web address] Date: 08/09/2023 Prepared by: Glade Pesa  Exercises - Seated Plantar Fascia Stretch  - 1 x daily - 7 x weekly - 1 sets - 3 reps - 20 hold - Soleus Stretch with Foot at Wall  - 1 x  daily - 7 x weekly - 1 sets - 3 reps - 20 hold - Big toe mobilization  - 1 x daily - 7 x weekly - 1 sets - 3 reps - 20 hold - Quadruped Rocking Backward  - 1 x daily - 7 x weekly - 1 sets - 10 reps - Seated Calf Raise With Small Ball at Heels  - 1 x daily - 7 x weekly - 1 sets - 10 reps - Standing Calf Raise With Small Ball at Heels  - 1 x daily - 7 x weekly - 1 sets - 10 reps - Towel Scrunches  - 1 x daily - 7 x weekly - 1 sets - 10 reps - Toe Yoga - Alternating Great Toe and Lesser Toe Extension  - 1 x daily - 7 x weekly - 1 sets - 10 reps - Heel-Toe Walking  - 1 x daily - 7 x weekly - 1 sets - 10 reps - Hooklying Sequential Leg March and Lower  - 1 x daily - 7 x weekly - 1 sets - 10 reps - Modified Side Plank with Hip Abduction  - 1 x daily - 7 x weekly - 1 sets - 10 reps - Sidelying Hip Abduction  - 1 x daily - 7 x weekly - 1 sets - 10 reps - Great Toe Flexion with Resistance  - 1 x daily - 7 x weekly - 1 sets - 10  reps  ASSESSMENT:  CLINICAL IMPRESSION:  Julie Brooks presents to skilled PT reporting the iontopatch has really helped to decrease pain in the planter aspect of bilateral feet. Pt educated that it is the combination of exercises with the iontophoresis that comprise the best plan of care for her progression. Pt complained of mild Rt calf pain during heel raises on step, most likely due to mild muscle cramping. PT student provided manual assistance during theraband 4 way ankle exercises to provide proper tension in the foot. Plan to do reassessment at next visit. Pt will continue to benefit from skilled PT to address the deficits below and to be able to work pain free.   OBJECTIVE IMPAIRMENTS: difficulty walking, decreased ROM, decreased strength, increased fascial restrictions, impaired perceived functional ability, postural dysfunction, and pain.   ACTIVITY LIMITATIONS: standing, squatting, locomotion level, and caring for others  PARTICIPATION LIMITATIONS: shopping, community activity, and occupation  PERSONAL FACTORS: Time since onset of injury/illness/exacerbation and 1 comorbidity: diabetes are also affecting patient's functional outcome.   REHAB POTENTIAL: Good  CLINICAL DECISION MAKING: Stable/uncomplicated  EVALUATION COMPLEXITY: Low   GOALS: Goals reviewed with patient? Yes  SHORT TERM GOALS: Target date: 08/29/2023   The patient will demonstrate knowledge of basic self care strategies and exercises to promote healing  Baseline: Goal status: Met on 08/31/2023  2.  Improved great toe extension to 20 degrees bil needed for walking and squatting Baseline:  Goal status: Met on 08/31/23  3.  The patient will report a 30% improvement in pain levels with functional activities which are currently difficult including getting out of bed, rising after sitting, standing and walking at work Baseline:  Goal status: Met on 08/31/23    LONG TERM GOALS: Target date: 09/28/2023    The patient  will be independent in a safe self progression of a home exercise program to promote further recovery of function  Baseline:  Goal status: Ongoing  2.  Improved great toe extension to 30 degrees bil needed for walking and squatting Baseline:  Goal status: Met  on 08/31/23  3.  Improved  Bil LE strength in ankles, feet, and hip  improved to grossly 4+/5 needed for prolong standing and walking at work Baseline:  Goal status: Ongoing  4.  The patient will report a 50% improvement in pain levels with functional activities which are currently difficult including getting out of bed, rising after sitting, standing and walking at work Baseline:  Goal status: Met on 08/31/23 with reports of 80% improvement  5.  LEFS functional outcome score improved to 46% indicating improved function with less pain Baseline:  Goal status: INITIAL    PLAN:  PT FREQUENCY: 1x/week  PT DURATION: 8 weeks  PLANNED INTERVENTIONS: 97164- PT Re-evaluation, 97110-Therapeutic exercises, 97530- Therapeutic activity, 97112- Neuromuscular re-education, 97535- Self Care, 02859- Manual therapy, [id number]- Aquatic Therapy, H9716- Electrical stimulation (unattended), [id number]- Electrical stimulation (manual), 97016- Vasopneumatic device, [id number]- Ultrasound, 02966- Ionotophoresis 4mg /ml Dexamethasone, Patient/Family education, Balance training, Taping, Joint mobilization, Cryotherapy, and Moist heat  PLAN FOR NEXT SESSION: iontophoresis if indicated, plan to do reassessment for continued PT vs discharge  Lavanda Cleverly, SPT 09/14/23 12:10 PM    I agree with the following treatment note after reviewing documentation. This session was performed under the supervision of a licensed clinician.  Jarrell Laming, PT, DPT 09/14/23, 12:10 PM  [facility] [address] [place], [place] [address] Phone # [phone] Fax [phone]

## 2023-09-21 ENCOUNTER — Ambulatory Visit: Admitting: Rehabilitative and Restorative Service Providers"

## 2023-09-21 ENCOUNTER — Encounter: Payer: Self-pay | Admitting: Rehabilitative and Restorative Service Providers"

## 2023-09-21 DIAGNOSIS — M25675 Stiffness of left foot, not elsewhere classified: Secondary | ICD-10-CM | POA: Diagnosis present

## 2023-09-21 DIAGNOSIS — M79671 Pain in right foot: Secondary | ICD-10-CM | POA: Diagnosis present

## 2023-09-21 DIAGNOSIS — M6281 Muscle weakness (generalized): Secondary | ICD-10-CM | POA: Diagnosis present

## 2023-09-21 DIAGNOSIS — M79672 Pain in left foot: Secondary | ICD-10-CM | POA: Insufficient documentation

## 2023-09-21 NOTE — Therapy (Signed)
 OUTPATIENT PHYSICAL THERAPY TREATMENT NOTE AND DISCHARGE SUMMARY   Patient Name: Julie Brooks MRN: [id number] DOB:January 28, 1986, 38 y.o., female Today's Date: 09/21/2023  END OF SESSION:  PT End of Session - 09/21/23 1156     Visit Number 8    Number of Visits 8    Date for PT Re-Evaluation 09/28/23    Authorization Type [employer] 8 VISITS 6/11-09/28/23    Authorization - Visit Number 8    Authorization - Number of Visits 8    PT Start Time 1100    PT Stop Time 1145    PT Time Calculation (min) 45 min    Activity Tolerance Patient tolerated treatment well    Behavior During Therapy [facility] for tasks assessed/performed            Past Medical History:  Diagnosis Date   Back pain    Hypertriglyceridemia 11/25/2020   09/29/2020   Placenta previa antepartum in third trimester 09/10/2021   10/05/21 at [facility]    Poorly controlled diabetes mellitus (HCC) 09/02/2021   A1c 10.3 at 8wks   Past Surgical History:  Procedure Laterality Date   APPENDECTOMY     CESAREAN SECTION N/A 02/04/2022   Procedure: CESAREAN SECTION;  Surgeon: Alger Gong, MD;  Location: [facility] LD [facility]S;  Service: Obstetrics;  Laterality: N/A;   DILATION AND CURETTAGE OF UTERUS  2004   Patient Active Problem List   Diagnosis Date Noted   History of bilateral tubal ligation 03/21/2022   Controlled type 2 diabetes mellitus without complication (HCC) 02/23/2022   Hypertriglyceridemia 11/25/2020   History of preterm delivery 02/02/2017    PCP: Virgilio Payor MD  REFERRING PROVIDER: Aloysius Case PA  REFERRING DIAG: 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 Pt states she feels she is much better and  has not had bilateral foot pain in a while. She states she feels ready to discharge today. Patient states that she was able to go dancing since last visit, states that she had some pain due to wearing high heels.  Has a 40 year old: the pain was really bad while pregnant   PERTINENT HISTORY: Diabetes  PAIN:   Are you having pain? No   PRECAUTIONS: None   WEIGHT BEARING RESTRICTIONS: No  FALLS:  Has patient fallen in last 6 months? 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  DIAGNOSTIC FINDINGS: none  PATIENT SURVEYS:  Eval:  LEFS (in [place]) 45/80= 56.3% 09/21/23 Lower Extremity Functional Score: 74 / 80 = 92.5 %    COGNITION: Overall cognitive status: Within functional limits for tasks assessed      POSTURE: with weight bearing bil pes planus  PALPATION: Left > right heel tenderness  LOWER EXTREMITY ROM:  Eval: decreased great toe mobility especially extension 10 degrees right and left; able to do a full squat with adequate ankle dorsiflexion but lacks toe extension;  ankle DF, PF, inversion and eversion ROM WFLs  08/31/2023:  30 degrees great toe bilat   LOWER EXTREMITY MMT:    MMT Right eval Left eval  Hip flexion    Hip extension    Hip abduction 5 4+  Hip adduction    Hip internal rotation    Hip external rotation    Knee flexion    Knee extension    Ankle dorsiflexion 4+ 4+  Ankle plantarflexion 4 4-  Ankle inversion 4 4  Ankle eversion 4 4   (Blank rows = not tested)  FUNCTIONAL ASSESSMENT: Eval: Single leg stance:  left 8 seconds, right 15 sec   GAIT: Comments: decreased toe extension  TREATMENT DATE:   09/21/23 Nustep level 5 x9  minutes with PT present to discuss status (education about using a tennis ball for fascial mobilization) Standing hamstring stretch at stairs 2x20 sec bilat Standing unilateral calf stretch on bottom step 2x20 sec bilat Standing heel raise on bottom step with UE support 2x10 Standing on flat side of bosu forw/backw x20, side to side x20 Standing mini squats on flat side of bosu 2x10 Standing on flat side of bosu performing around the worlds with 10# kettle bell x10 bilat Marching with alt UE raise with 7# dumbbells in each hand x10 bilat Seated 4 way ankle strengthening with green tband 2x10 bilat Iontopatch to bilat plantar surface (4mg /mL Dexamethasone) with 4 hour wear time    09/14/23 Nustep level 5 x 5 minutes with PT present to discuss status Standing hamstring stretch at stairs 2x20 sec bilat Standing unilateral calf stretch on bottom step 2x20 sec bilat Standing heel raise on bottom step with UE support 2x10 Standing on flat side of bosu for static balance 2 min  Standing mini squats on flat side of bosu 2x10 Standing on flat side of bosu performing around the worlds with 10# kettle bell x10 bilat Marching with alt UE raise with 7# dumbbells in each hand x10 bilat Seated ball press in lap for abdominal strengthening 2x10 Seated ball press in lap for oblique strengthening 1x10 Seated ball press at ankles for lower leg strengthening 2x10 Seated 4 way ankle strengthening with green tband 2x10 bilat Iontopatch to bilat plantar surface (4mg /mL Dexamethasone) with 4 hour wear time (3/6)   09/07/2023: Nustep level 5 x6 minutes with PT present to discuss status Standing hamstring stretch at stairs 2x20 sec bilat Standing unilateral calf stretch on bottom step 2x20 sec bilat Standing heel raise on bottom step with UE support 2x10 Standing on flat side of bosu for static balance x1 min Standing mini squats on flat side of bosu 2x10 Standing on flat side of bosu performing around the  worlds with 10# kettle bell x10 bilat Seated BAPS board (level 2) CW/CCW x10 each bilat Marching with alt UE raise with 5# dumbbells in each hand x10 bilat Seated 4 way ankle strengthening with green tband 2x10 bilat Iontopatch to bilat plantar surface (4mg /mL Dexamethasone) with 4 hour wear time (2/6)      PATIENT EDUCATION:  Education details: Educated patient on anatomy and physiology of current symptoms, prognosis, plan of care as well as initial self care strategies to promote recovery Person educated: Patient Education method: Explanation and Handouts Education comprehension: verbalized understanding  HOME EXERCISE PROGRAM: Access Code: [email or web address] URL: [email or web address] Date: 08/09/2023 Prepared by: Glade Pesa  Exercises - Seated Plantar Fascia Stretch  - 1 x daily - 7 x weekly - 1 sets - 3 reps - 20 hold - Soleus Stretch with Foot at Wall  - 1 x  daily - 7 x weekly - 1 sets - 3 reps - 20 hold - Big toe mobilization  - 1 x daily - 7 x weekly - 1 sets - 3 reps - 20 hold - Quadruped Rocking Backward  - 1 x daily - 7 x weekly - 1 sets - 10 reps - Seated Calf Raise With Small Ball at Heels  - 1 x daily - 7 x weekly - 1 sets - 10 reps - Standing Calf Raise With Small Ball at Heels  - 1 x daily - 7 x weekly - 1 sets - 10 reps - Towel Scrunches  - 1 x daily - 7 x weekly - 1 sets - 10 reps - Toe Yoga - Alternating Great Toe and Lesser Toe Extension  - 1 x daily - 7 x weekly - 1 sets - 10 reps - Heel-Toe Walking  - 1 x daily - 7 x weekly - 1 sets - 10 reps - Hooklying Sequential Leg March and Lower  - 1 x daily - 7 x weekly - 1 sets - 10 reps - Modified Side Plank with Hip Abduction  - 1 x daily - 7 x weekly - 1 sets - 10 reps - Sidelying Hip Abduction  - 1 x daily - 7 x weekly - 1 sets - 10 reps - Great Toe Flexion with Resistance  - 1 x daily - 7 x weekly - 1 sets - 10 reps  ASSESSMENT:  CLINICAL IMPRESSION:   Saori presents to skilled PT reporting  significant improvement in both feet since starting PT. Pt performed all exercises asked of her today without any adverse reactions. She states she has met her goal to be able to stand for work without pain. All of patient goals are met, and patient agrees she is ready to discharge today. Pt was given tennis ball and instructed to roll both feet on it after work for approx 5 min to decrease potential fascial hardening. She was also given iontophoresis containing dexamethasone treatment patches for both feet, instructed to remove patches approx 4 hours after application. Pt was recommended to follow up with MD and request another referral for PT if symptoms occur again in the future, she verbalizes her understanding.  Patient discharged from skilled PT at this time to continue with HEP.    OBJECTIVE IMPAIRMENTS: difficulty walking, decreased ROM, decreased strength, increased fascial restrictions, impaired perceived functional ability, postural dysfunction, and pain.   ACTIVITY LIMITATIONS: standing, squatting, locomotion level, and caring for others  PARTICIPATION LIMITATIONS: shopping, community activity, and occupation  PERSONAL FACTORS: Time since onset of injury/illness/exacerbation and 1 comorbidity: diabetes are also affecting patient's functional outcome.   REHAB POTENTIAL: Good  CLINICAL DECISION MAKING: Stable/uncomplicated  EVALUATION COMPLEXITY: Low   GOALS: Goals reviewed with patient? Yes  SHORT TERM GOALS: Target date: 08/29/2023   The patient will demonstrate knowledge of basic self care strategies and exercises to promote healing  Baseline: Goal status: Met on 08/31/2023  2.  Improved great toe extension to 20 degrees bil needed for walking and squatting Baseline:  Goal status: Met on 08/31/23  3.  The patient will report a 30% improvement in pain levels with functional activities which are currently difficult including getting out of bed, rising after sitting, standing  and walking at work Baseline:  Goal status: Met on 08/31/23    LONG TERM GOALS: Target date: 09/28/2023    The patient will be independent in a safe self progression of a home exercise  program to promote further recovery of function  Baseline:  Goal status: MET 09/21/23  2.  Improved great toe extension to 30 degrees bil needed for walking and squatting Baseline:  Goal status: Met on 08/31/23  3.  Improved  Bil LE strength in ankles, feet, and hip  improved to grossly 4+/5 needed for prolong standing and walking at work Baseline:  Goal status: Met 09/21/23  4.  The patient will report a 50% improvement in pain levels with functional activities which are currently difficult including getting out of bed, rising after sitting, standing and walking at work Baseline:  Goal status: Met on 08/31/23 with reports of 80% improvement  5.  LEFS functional outcome score improved to 46% indicating improved function with less pain Baseline:  Goal status: Met 09/21/23 LEFS 92.5%    PLAN:  PT FREQUENCY: 1x/week  PT DURATION: 8 weeks  PLANNED INTERVENTIONS: 97164- PT Re-evaluation, 97110-Therapeutic exercises, 97530- Therapeutic activity, 97112- Neuromuscular re-education, 97535- Self Care, 02859- Manual therapy, [id number]- Aquatic Therapy, H9716- Electrical stimulation (unattended), [id number]- Electrical stimulation (manual), [id number]- Vasopneumatic device, [id number]- Ultrasound, [id number]- Ionotophoresis 4mg /ml Dexamethasone, Patient/Family education, Balance training, Taping, Joint mobilization, Cryotherapy, and Moist heat  PHYSICAL THERAPY DISCHARGE SUMMARY  See above for details of episode of care.  Patient agrees to discharge. Patient goals were met. Patient is being discharged due to meeting the stated rehab goals.   Lavanda Cleverly, SPT 09/21/23 12:20 PM    I agree with the following treatment note after reviewing documentation. This session was performed under the supervision of a licensed  clinician.  Jarrell Laming, PT, DPT 09/21/23, 12:20 PM  [facility] [address], [address] [place], [place] [address] Phone # [phone] Fax [phone]

## 2023-09-28 ENCOUNTER — Encounter: Admitting: Physical Therapy

## 2024-01-28 ENCOUNTER — Encounter (HOSPITAL_COMMUNITY): Payer: Self-pay

## 2024-01-28 ENCOUNTER — Emergency Department (HOSPITAL_COMMUNITY)

## 2024-01-28 ENCOUNTER — Emergency Department (HOSPITAL_COMMUNITY)
Admission: EM | Admit: 2024-01-28 | Discharge: 2024-01-28 | Disposition: A | Discharge status: Home / Self Care | Attending: Emergency Medicine | Admitting: Emergency Medicine

## 2024-01-28 ENCOUNTER — Other Ambulatory Visit: Payer: Self-pay

## 2024-01-28 DIAGNOSIS — F0781 Postconcussional syndrome: Secondary | ICD-10-CM | POA: Insufficient documentation

## 2024-01-28 DIAGNOSIS — R519 Headache, unspecified: Secondary | ICD-10-CM | POA: Insufficient documentation

## 2024-01-28 DIAGNOSIS — Y92009 Unspecified place in unspecified non-institutional (private) residence as the place of occurrence of the external cause: Secondary | ICD-10-CM | POA: Insufficient documentation

## 2024-01-28 DIAGNOSIS — W01198A Fall on same level from slipping, tripping and stumbling with subsequent striking against other object, initial encounter: Secondary | ICD-10-CM | POA: Insufficient documentation

## 2024-01-28 MED ORDER — ONDANSETRON HCL 4 MG PO TABS: 4.0000 mg | ORAL_TABLET | Freq: Three times a day (TID) | ORAL | 0 refills | Status: AC | PRN

## 2024-01-28 MED ORDER — NAPROXEN 500 MG PO TABS: 500.0000 mg | ORAL_TABLET | Freq: Two times a day (BID) | ORAL | 0 refills | Status: AC

## 2024-01-28 NOTE — ED Triage Notes (Signed)
 Pt coming in reporting a fall at home. Pt reports that she stood up from bed and she just remembers falling backwards and she fell on her butt and she hit against the wall. Pt reports hitting her head. Pt denies loc. Pt denies thinners.

## 2024-01-28 NOTE — ED Provider Triage Note (Signed)
 Emergency Medicine Provider Triage Evaluation Note  Julie Brooks , a 38 y.o. female  was evaluated in triage.  Pt complains of fall, head injury yesterday. Stood up and lost balance. No dizziness. Went to sitting position then backwards, hitting her head. Here with headache, photosensitivity, no neck pain. Nausea yesterday, none today..  Review of Systems  Positive:  Negative:   Physical Exam  BP 134/72   Pulse 80   Temp 98.3 F (36.8 C)   Resp 14   Ht 5' 1.02 (1.55 m)   Wt 81.6 kg   LMP 01/05/2024 (Approximate)   SpO2 100%   Breastfeeding No   BMI 33.98 kg/m  Gen:   Awake, no distress   Resp:  Normal effort  MSK:   Moves extremities without difficulty  Other:    Medical Decision Making  Medically screening exam initiated at 3:58 PM.  Appropriate orders placed.  Julie Brooks was informed that the remainder of the evaluation will be completed by another provider, this initial triage assessment does not replace that evaluation, and the importance of remaining in the ED until their evaluation is complete.     Julie Balls, PA-C 01/28/24 [id number]

## 2024-01-28 NOTE — Discharge Instructions (Addendum)
 Como ya hablamos, tome los medicamentos segn lo recetado: naproxeno para el dolor y Zofran  si presenta nuseas. Consulte con su mdico si no mejora en 2 o 3 das. Regrese a la sala de emergencias si presenta sntomas nuevos o si sus sntomas empeoran en cualquier momento.  As we discussed, take the medications as prescribed; Naproxen  for pain and Zofran  if you have any nausea. Follow up with your doctor if no better in 2-3 days. Return to the ED with any new or worsening symptoms at any time.

## 2024-01-28 NOTE — ED Provider Notes (Cosign Needed)
 [place] EMERGENCY DEPARTMENT AT [facility] Provider Note   CSN: [id number] Arrival date & time: 01/28/24  1527     Patient presents with: Julie Brooks is a 38 y.o. female.   Patient to ED, complains of fall, head injury yesterday. Stood up and lost balance. No dizziness, no chest pain. Went to sitting position then backwards, hitting her head. Here with headache, photosensitivity, no neck or back pain. Nausea yesterday, none today.  The history is provided by the patient. A language interpreter was used.  Fall       Prior to Admission medications  Medication Sig Start Date End Date Taking? Authorizing Provider  naproxen  (NAPROSYN ) 500 MG tablet Take 1 tablet (500 mg total) by mouth 2 (two) times daily. 01/28/24  Yes Bracken Moffa, Margit, PA-C  ondansetron  (ZOFRAN ) 4 MG tablet Take 1 tablet (4 mg total) by mouth every 8 (eight) hours as needed for nausea or vomiting. 01/28/24  Yes Odell Margit, PA-C  Accu-Chek Softclix Lancets lancets To check blood sugars 4 times a day. Fasting and 2 hours after the first bite of breakfast, lunch and dinner. Patient not taking: Reported on 02/15/2022 08/30/21   Virgilio Payor, MD  acetaminophen  (TYLENOL ) 500 MG tablet Take 2 tablets (1,000 mg total) by mouth every 6 (six) hours. Patient not taking: Reported on 08/01/2023 02/06/22   Cresenzo, John V, MD  Blood Glucose Monitoring Suppl (ACCU-CHEK GUIDE) w/Device KIT 1 Device by Does not apply route as needed. Patient not taking: Reported on 02/15/2022 08/25/21   Virgilio Payor, MD  Continuous Blood Gluc Sensor (DEXCOM G6 SENSOR) MISC Change every 10 days. Use with Dexcom Patient not taking: Reported on 02/15/2022 11/15/21   Fredirick Glenys RAMAN, MD  Continuous Blood Gluc Transmit (DEXCOM G6 TRANSMITTER) MISC 1 Device by Does not apply route every 3 (three) months. Patient not taking: Reported on 02/15/2022 09/07/21   Fredirick Glenys RAMAN, MD  furosemide  (LASIX ) 20 MG tablet Take 1 tablet (20 mg total)  by mouth daily for 4 days. 02/06/22 02/10/22  Ilean Norleen GAILS, MD  glucose blood (ACCU-CHEK GUIDE) test strip Use as instructed Patient not taking: Reported on 02/15/2022 08/25/21   Virgilio Payor, MD  ibuprofen  (ADVIL ) 600 MG tablet Take 1 tablet (600 mg total) by mouth every 6 (six) hours. 03/21/22   Lola Donnice HERO, MD  metFORMIN  (GLUCOPHAGE ) 1000 MG tablet Take 1 tablet (1,000 mg total) by mouth 2 (two) times daily with a meal. 03/21/22   Lola Donnice HERO, MD  oxyCODONE  (OXY IR/ROXICODONE ) 5 MG immediate release tablet Take 1-2 tablets (5-10 mg total) by mouth every 4 (four) hours as needed for moderate pain. Patient not taking: Reported on 08/01/2023 02/06/22   Cresenzo, John V, MD  Prenatal Vit-Fe Fumarate-FA (PRENATAL VITAMIN) 27-0.8 MG TABS Take 1 tablet by mouth daily. 07/21/21   Virgilio Payor, MD    Allergies: Azithromycin and Fioricet [butalbital-apap-caffeine]    Review of Systems  Updated Vital Signs BP 134/72   Pulse 80   Temp 98.3 F (36.8 C)   Resp 14   Ht 5' 1.02 (1.55 m)   Wt 81.6 kg   LMP 01/05/2024 (Approximate)   SpO2 100%   Breastfeeding No   BMI 33.98 kg/m   Physical Exam Vitals and nursing note reviewed.  Constitutional:      Appearance: She is well-developed.  HENT:     Head: Normocephalic.  Cardiovascular:     Rate and Rhythm: Normal rate and regular rhythm.  Heart sounds: No murmur heard. Pulmonary:     Effort: Pulmonary effort is normal.     Breath sounds: Normal breath sounds. No wheezing, rhonchi or rales.  Abdominal:     General: Bowel sounds are normal.     Palpations: Abdomen is soft.     Tenderness: There is no abdominal tenderness. There is no guarding or rebound.  Musculoskeletal:        General: Normal range of motion.     Cervical back: Normal range of motion and neck supple.  Skin:    General: Skin is warm and dry.  Neurological:     General: No focal deficit present.     Mental Status: She is alert and oriented to person, place,  and time.     GCS: GCS eye subscore is 4. GCS verbal subscore is 5. GCS motor subscore is 6.     Cranial Nerves: No cranial nerve deficit, dysarthria or facial asymmetry.     Sensory: Sensation is intact.     Motor: No weakness, abnormal muscle tone or pronator drift.     Coordination: Finger-Nose-Finger Test normal.     Gait: Gait normal.     Deep Tendon Reflexes:     Reflex Scores:      Patellar reflexes are 2+ on the right side and 2+ on the left side.    (all labs ordered are listed, but only abnormal results are displayed) Labs Reviewed - No data to display  EKG: None  Radiology: No results found.    Procedures   Medications Ordered in the ED - No data to display  Clinical Course as of 02/07/24 1826  Mon Jan 28, 2024  1659 Patient to ED for evaluation of minor head injury yesterday having symptoms of HA, photophobia, nausea (yesterday) without vomiting. Neuro exam intact without deficit. She appears uncomfortable. CT head obtained and, per radiology, is interpreted as:  IMPRESSION: 1. No acute intracranial abnormality.   VSS. She is felt appropriate for discharge home. Return precautions discussed.  [SU]    Clinical Course User Index [SU] Odell Balls, PA-C                                 Medical Decision Making Amount and/or Complexity of Data Reviewed Radiology: ordered.  Risk Prescription drug management.        Final diagnoses:  Fall in home, initial encounter  Post concussion syndrome    ED Discharge Orders          Ordered    ondansetron  (ZOFRAN ) 4 MG tablet  Every 8 hours PRN        01/28/24 1646    naproxen  (NAPROSYN ) 500 MG tablet  2 times daily        01/28/24 1646               Odell Balls, PA-C 02/07/24 1827

## 2024-01-28 NOTE — ED Triage Notes (Signed)
 First Nurse Note: Pt fell from a standing position in her room yesterday and hit the back of her head against the wall. Denies LOC. Unsure what caused her to fall. Denies anticoagulants.
